# Patient Record
Sex: Male | Born: 1945
Health system: Southern US, Community
[De-identification: ages and names within clinical notes are randomized; demographics above are authoritative.]

## PROBLEM LIST (undated history)

## (undated) DIAGNOSIS — Z803 Family history of malignant neoplasm of breast: Secondary | ICD-10-CM

## (undated) DIAGNOSIS — E559 Vitamin D deficiency, unspecified: Secondary | ICD-10-CM

## (undated) DIAGNOSIS — I2510 Atherosclerotic heart disease of native coronary artery without angina pectoris: Secondary | ICD-10-CM

## (undated) DIAGNOSIS — K529 Noninfective gastroenteritis and colitis, unspecified: Secondary | ICD-10-CM

## (undated) DIAGNOSIS — K219 Gastro-esophageal reflux disease without esophagitis: Secondary | ICD-10-CM

## (undated) DIAGNOSIS — N400 Enlarged prostate without lower urinary tract symptoms: Secondary | ICD-10-CM

## (undated) DIAGNOSIS — N529 Male erectile dysfunction, unspecified: Secondary | ICD-10-CM

## (undated) DIAGNOSIS — I10 Essential (primary) hypertension: Secondary | ICD-10-CM

## (undated) DIAGNOSIS — E119 Type 2 diabetes mellitus without complications: Secondary | ICD-10-CM

## (undated) DIAGNOSIS — E785 Hyperlipidemia, unspecified: Secondary | ICD-10-CM

## (undated) DIAGNOSIS — Z806 Family history of leukemia: Secondary | ICD-10-CM

## (undated) DIAGNOSIS — N200 Calculus of kidney: Secondary | ICD-10-CM

## (undated) HISTORY — DX: Vitamin D deficiency, unspecified: E55.9

## (undated) HISTORY — DX: Family history of malignant neoplasm of breast: Z80.3

## (undated) HISTORY — DX: Male erectile dysfunction, unspecified: N52.9

## (undated) HISTORY — DX: Benign prostatic hyperplasia without lower urinary tract symptoms: N40.0

## (undated) HISTORY — DX: Noninfective gastroenteritis and colitis, unspecified: K52.9

## (undated) HISTORY — DX: Essential (primary) hypertension: I10

## (undated) HISTORY — DX: Family history of leukemia: Z80.6

## (undated) HISTORY — DX: Type 2 diabetes mellitus without complications: E11.9

## (undated) HISTORY — DX: Gastro-esophageal reflux disease without esophagitis: K21.9

## (undated) HISTORY — DX: Calculus of kidney: N20.0

## (undated) HISTORY — DX: Hyperlipidemia, unspecified: E78.5

---

## 1989-10-08 HISTORY — PX: CYSTOSCOPY W/ RETROGRADES: SHX1426

## 1991-10-09 HISTORY — PX: FLEXIBLE SIGMOIDOSCOPY: SHX1649

## 1999-06-15 ENCOUNTER — Encounter: Payer: Self-pay | Admitting: Internal Medicine

## 1999-06-15 ENCOUNTER — Ambulatory Visit (HOSPITAL_COMMUNITY): Admission: RE | Admit: 1999-06-15 | Discharge: 1999-06-15 | Payer: Self-pay | Admitting: Internal Medicine

## 1999-10-09 HISTORY — PX: SQUAMOUS CELL CARCINOMA EXCISION: SHX2433

## 2000-08-19 ENCOUNTER — Ambulatory Visit (HOSPITAL_BASED_OUTPATIENT_CLINIC_OR_DEPARTMENT_OTHER): Admission: RE | Admit: 2000-08-19 | Discharge: 2000-08-19 | Payer: Self-pay | Admitting: Plastic Surgery

## 2007-06-09 LAB — HM COLONOSCOPY

## 2013-09-16 ENCOUNTER — Encounter: Payer: Self-pay | Admitting: Internal Medicine

## 2013-09-18 ENCOUNTER — Ambulatory Visit (INDEPENDENT_AMBULATORY_CARE_PROVIDER_SITE_OTHER): Payer: Medicare Other | Admitting: Internal Medicine

## 2013-09-18 ENCOUNTER — Encounter: Payer: Self-pay | Admitting: Internal Medicine

## 2013-09-18 VITALS — BP 136/70 | HR 44 | Temp 97.9°F | Resp 18 | Ht 69.25 in | Wt 190.4 lb

## 2013-09-18 DIAGNOSIS — Z79899 Other long term (current) drug therapy: Secondary | ICD-10-CM

## 2013-09-18 DIAGNOSIS — E119 Type 2 diabetes mellitus without complications: Secondary | ICD-10-CM

## 2013-09-18 DIAGNOSIS — E559 Vitamin D deficiency, unspecified: Secondary | ICD-10-CM

## 2013-09-18 DIAGNOSIS — Z1212 Encounter for screening for malignant neoplasm of rectum: Secondary | ICD-10-CM

## 2013-09-18 DIAGNOSIS — I10 Essential (primary) hypertension: Secondary | ICD-10-CM

## 2013-09-18 DIAGNOSIS — Z0000 Encounter for general adult medical examination without abnormal findings: Secondary | ICD-10-CM

## 2013-09-18 DIAGNOSIS — E782 Mixed hyperlipidemia: Secondary | ICD-10-CM

## 2013-09-18 DIAGNOSIS — Z125 Encounter for screening for malignant neoplasm of prostate: Secondary | ICD-10-CM

## 2013-09-18 LAB — CBC WITH DIFFERENTIAL/PLATELET
Basophils Absolute: 0.1 10*3/uL (ref 0.0–0.1)
Basophils Relative: 1 % (ref 0–1)
Eosinophils Absolute: 0.8 10*3/uL — ABNORMAL HIGH (ref 0.0–0.7)
Hemoglobin: 15.1 g/dL (ref 13.0–17.0)
MCH: 29.4 pg (ref 26.0–34.0)
MCHC: 35.8 g/dL (ref 30.0–36.0)
Monocytes Absolute: 0.9 10*3/uL (ref 0.1–1.0)
Monocytes Relative: 9 % (ref 3–12)
Neutrophils Relative %: 56 % (ref 43–77)
RDW: 13.6 % (ref 11.5–15.5)
WBC: 10 10*3/uL (ref 4.0–10.5)

## 2013-09-18 LAB — HEPATIC FUNCTION PANEL
ALT: 13 U/L (ref 0–53)
Alkaline Phosphatase: 54 U/L (ref 39–117)
Total Bilirubin: 0.7 mg/dL (ref 0.3–1.2)
Total Protein: 7 g/dL (ref 6.0–8.3)

## 2013-09-18 LAB — BASIC METABOLIC PANEL WITH GFR
BUN: 17 mg/dL (ref 6–23)
Calcium: 9.7 mg/dL (ref 8.4–10.5)
Chloride: 101 mEq/L (ref 96–112)
GFR, Est African American: 75 mL/min
GFR, Est Non African American: 65 mL/min
Glucose, Bld: 83 mg/dL (ref 70–99)
Potassium: 4.8 mEq/L (ref 3.5–5.3)
Sodium: 139 mEq/L (ref 135–145)

## 2013-09-18 LAB — HEMOGLOBIN A1C
Hgb A1c MFr Bld: 6 % — ABNORMAL HIGH (ref <5.7)
Mean Plasma Glucose: 126 mg/dL — ABNORMAL HIGH (ref <117)

## 2013-09-18 LAB — LIPID PANEL
Cholesterol: 180 mg/dL (ref 0–200)
Triglycerides: 125 mg/dL (ref <150)

## 2013-09-18 LAB — MAGNESIUM: Magnesium: 1.9 mg/dL (ref 1.5–2.5)

## 2013-09-18 MED ORDER — ATENOLOL 100 MG PO TABS: ORAL_TABLET | ORAL | Status: DC

## 2013-09-18 NOTE — Patient Instructions (Addendum)
Continue diet & medications same as discussed.   Further disposition pending lab results.    Stop Dicyclomine/ Bentyl   Cut Atenolol 100 mg tabs in 1/2 = 50 mg and monitor BP and pulse - call if BP goes up over 145/90 or pulse stays below 55 beats per minute     Hypertension As your heart beats, it forces blood through your arteries. This force is your blood pressure. If the pressure is too high, it is called hypertension (HTN) or high blood pressure. HTN is dangerous because you may have it and not know it. High blood pressure may mean that your heart has to work harder to pump blood. Your arteries may be narrow or stiff. The extra work puts you at risk for heart disease, stroke, and other problems.  Blood pressure consists of two numbers, a higher number over a lower, 110/72, for example. It is stated as "110 over 72." The ideal is below 120 for the top number (systolic) and under 80 for the bottom (diastolic). Write down your blood pressure today. You should pay close attention to your blood pressure if you have certain conditions such as:  Heart failure.  Prior heart attack.  Diabetes  Chronic kidney disease.  Prior stroke.  Multiple risk factors for heart disease. To see if you have HTN, your blood pressure should be measured while you are seated with your arm held at the level of the heart. It should be measured at least twice. A one-time elevated blood pressure reading (especially in the Emergency Department) does not mean that you need treatment. There may be conditions in which the blood pressure is different between your right and left arms. It is important to see your caregiver soon for a recheck. Most people have essential hypertension which means that there is not a specific cause. This type of high blood pressure may be lowered by changing lifestyle factors such as:  Stress.  Smoking.  Lack of exercise.  Excessive weight.  Drug/tobacco/alcohol use.  Eating less  salt. Most people do not have symptoms from high blood pressure until it has caused damage to the body. Effective treatment can often prevent, delay or reduce that damage. TREATMENT  When a cause has been identified, treatment for high blood pressure is directed at the cause. There are a large number of medications to treat HTN. These fall into several categories, and your caregiver will help you select the medicines that are best for you. Medications may have side effects. You should review side effects with your caregiver. If your blood pressure stays high after you have made lifestyle changes or started on medicines,   Your medication(s) may need to be changed.  Other problems may need to be addressed.  Be certain you understand your prescriptions, and know how and when to take your medicine.  Be sure to follow up with your caregiver within the time frame advised (usually within two weeks) to have your blood pressure rechecked and to review your medications.  If you are taking more than one medicine to lower your blood pressure, make sure you know how and at what times they should be taken. Taking two medicines at the same time can result in blood pressure that is too low. SEEK IMMEDIATE MEDICAL CARE IF:  You develop a severe headache, blurred or changing vision, or confusion.  You have unusual weakness or numbness, or a faint feeling.  You have severe chest or abdominal pain, vomiting, or breathing problems. MAKE SURE  YOU:   Understand these instructions.  Will watch your condition.  Will get help right away if you are not doing well or get worse. Document Released: 09/24/2005 Document Revised: 12/17/2011 Document Reviewed: 05/14/2008 [facility] Patient Information 2014 [place], [place].  Diabetes and Exercise Exercising regularly is important. It is not just about losing weight. It has many health benefits, such as:  Improving your overall fitness, flexibility, and  endurance.  Increasing your bone density.  Helping with weight control.  Decreasing your body fat.  Increasing your muscle strength.  Reducing stress and tension.  Improving your overall health. People with diabetes who exercise gain additional benefits because exercise:  Reduces appetite.  Improves the body's use of blood sugar (glucose).  Helps lower or control blood glucose.  Decreases blood pressure.  Helps control blood lipids (such as cholesterol and triglycerides).  Improves the body's use of the hormone insulin by:  Increasing the body's insulin sensitivity.  Reducing the body's insulin needs.  Decreases the risk for heart disease because exercising:  Lowers cholesterol and triglycerides levels.  Increases the levels of good cholesterol (such as high-density lipoproteins [HDL]) in the body.  Lowers blood glucose levels. YOUR ACTIVITY PLAN  Choose an activity that you enjoy and set realistic goals. Your health care provider or diabetes educator can help you make an activity plan that works for you. You can break activities into 2 or 3 sessions throughout the day. Doing so is as good as one long session. Exercise ideas include:  Taking the dog for a walk.  Taking the stairs instead of the elevator.  Dancing to your favorite song.  Doing your favorite exercise with a friend. RECOMMENDATIONS FOR EXERCISING WITH TYPE 1 OR TYPE 2 DIABETES   Check your blood glucose before exercising. If blood glucose levels are greater than 240 mg/dL, check for urine ketones. Do not exercise if ketones are present.  Avoid injecting insulin into areas of the body that are going to be exercised. For example, avoid injecting insulin into:  The arms when playing tennis.  The legs when jogging.  Keep a record of:  Food intake before and after you exercise.  Expected peak times of insulin action.  Blood glucose levels before and after you exercise.  The type and amount of  exercise you have done.  Review your records with your health care provider. Your health care provider will help you to develop guidelines for adjusting food intake and insulin amounts before and after exercising.  If you take insulin or oral hypoglycemic agents, watch for signs and symptoms of hypoglycemia. They include:  Dizziness.  Shaking.  Sweating.  Chills.  Confusion.  Drink plenty of water while you exercise to prevent dehydration or heat stroke. Body water is lost during exercise and must be replaced.  Talk to your health care provider before starting an exercise program to make sure it is safe for you. Remember, almost any type of activity is better than none. Document Released: 12/15/2003 Document Revised: 05/27/2013 Document Reviewed: 03/03/2013 [facility] Patient Information 2014 [place], [place].  Cholesterol Cholesterol is a white, waxy, fat-like protein needed by your body in small amounts. The liver makes all the cholesterol you need. It is carried from the liver by the blood through the blood vessels. Deposits (plaque) may build up on blood vessel walls. This makes the arteries narrower and stiffer. Plaque increases the risk for heart attack and stroke. You cannot feel your cholesterol level even if it is very high. The only  way to know is by a blood test to check your lipid (fats) levels. Once you know your cholesterol levels, you should keep a record of the test results. Work with your caregiver to to keep your levels in the desired range. WHAT THE RESULTS MEAN:  Total cholesterol is a rough measure of all the cholesterol in your blood.  LDL is the so-called bad cholesterol. This is the type that deposits cholesterol in the walls of the arteries. You want this level to be low.  HDL is the good cholesterol because it cleans the arteries and carries the LDL away. You want this level to be high.  Triglycerides are fat that the body can either burn for energy or store.  High levels are closely linked to heart disease. DESIRED LEVELS:  Total cholesterol below 200.  LDL below 100 for people at risk, below 70 for very high risk.  HDL above 50 is good, above 60 is best.  Triglycerides below 150. HOW TO LOWER YOUR CHOLESTEROL:  Diet.  Choose fish or white meat chicken and [place], roasted or baked. Limit fatty cuts of red meat, fried foods, and processed meats, such as sausage and lunch meat.  Eat lots of fresh fruits and vegetables. Choose whole grains, beans, pasta, potatoes and cereals.  Use only small amounts of olive, corn or canola oils. Avoid butter, mayonnaise, shortening or palm kernel oils. Avoid foods with trans-fats.  Use skim/nonfat milk and low-fat/nonfat yogurt and cheeses. Avoid whole milk, cream, ice cream, egg yolks and cheeses. Healthy desserts include angel food cake, ginger snaps, animal crackers, hard candy, popsicles, and low-fat/nonfat frozen yogurt. Avoid pastries, cakes, pies and cookies.  Exercise.  A regular program helps decrease LDL and raises HDL.  Helps with weight control.  Do things that increase your activity level like gardening, walking, or taking the stairs.  Medication.  May be prescribed by your caregiver to help lowering cholesterol and the risk for heart disease.  You may need medicine even if your levels are normal if you have several risk factors. HOME CARE INSTRUCTIONS   Follow your diet and exercise programs as suggested by your caregiver.  Take medications as directed.  Have blood work done when your caregiver feels it is necessary. MAKE SURE YOU:   Understand these instructions.  Will watch your condition.  Will get help right away if you are not doing well or get worse. Document Released: 06/19/2001 Document Revised: 12/17/2011 Document Reviewed: 12/10/2007 [facility] Patient Information 2014 [place], [place].  Vitamin D Deficiency Vitamin D is an important vitamin that your body needs.  Having too little of it in your body is called a deficiency. A very bad deficiency can make your bones soft and can cause a condition called rickets.  Vitamin D is important to your body for different reasons, such as:   It helps your body absorb 2 minerals called calcium and phosphorus.  It helps make your bones healthy.  It may prevent some diseases, such as diabetes and multiple sclerosis.  It helps your muscles and heart. You can get vitamin D in several ways. It is a natural part of some foods. The vitamin is also added to some dairy products and cereals. Some people take vitamin D supplements. Also, your body makes vitamin D when you are in the sun. It changes the sun's rays into a form of the vitamin that your body can use. CAUSES   Not eating enough foods that contain vitamin D.  Not getting enough  sunlight.  Having certain digestive system diseases that make it hard to absorb vitamin D. These diseases include Crohn's disease, chronic pancreatitis, and cystic fibrosis.  Having a surgery in which part of the stomach or small intestine is removed.  Being obese. Fat cells pull vitamin D out of your blood. That means that obese people may not have enough vitamin D left in their blood and in other body tissues.  Having chronic kidney or liver disease. RISK FACTORS Risk factors are things that make you more likely to develop a vitamin D deficiency. They include:  Being older.  Not being able to get outside very much.  Living in a nursing home.  Having had broken bones.  Having weak or thin bones (osteoporosis).  Having a disease or condition that changes how your body absorbs vitamin D.  Having dark skin.  Some medicines such as seizure medicines or steroids.  Being overweight or obese. SYMPTOMS Mild cases of vitamin D deficiency may not have any symptoms. If you have a very bad case, symptoms may include:  Bone pain.  Muscle pain.  Falling often.  Broken bones  caused by a minor injury, due to osteoporosis. DIAGNOSIS A blood test is the best way to tell if you have a vitamin D deficiency. TREATMENT Vitamin D deficiency can be treated in different ways. Treatment for vitamin D deficiency depends on what is causing it. Options include:  Taking vitamin D supplements.  Taking a calcium supplement. Your caregiver will suggest what dose is best for you. HOME CARE INSTRUCTIONS  Take any supplements that your caregiver prescribes. Follow the directions carefully. Take only the suggested amount.  Have your blood tested 2 months after you start taking supplements.  Eat foods that contain vitamin D. Healthy choices include:  Fortified dairy products, cereals, or juices. Fortified means vitamin D has been added to the food. Check the label on the package to be sure.  Fatty fish like salmon or trout.  Eggs.  Oysters.  Do not use a tanning bed.  Keep your weight at a healthy level. Lose weight if you need to.  Keep all follow-up appointments. Your caregiver will need to perform blood tests to make sure your vitamin D deficiency is going away. SEEK MEDICAL CARE IF:  You have any questions about your treatment.  You continue to have symptoms of vitamin D deficiency.  You have nausea or vomiting.  You are constipated.  You feel confused.  You have severe abdominal or back pain. MAKE SURE YOU:  Understand these instructions.  Will watch your condition.  Will get help right away if you are not doing well or get worse. Document Released: 12/17/2011 Document Revised: 01/19/2013 Document Reviewed: 12/17/2011 [facility] Patient Information 2014 [place], [place].

## 2013-09-18 NOTE — Progress Notes (Signed)
Patient ID: Daniel Burnett, male   DOB: 06-27-1946, 67 y.o.   MRN: [id number]  Annual Screening Comprehensive Examination  This very nice 67 yo MWMpresents for complete physical.  Patient has been followed for HTN (1995), Diabetes (2002), Hyperlipidemia, and Vitamin D Deficiency.   Patient's BP has been controlled at home. Patient denies any cardiac symptoms as chest pain, palpitations, shortness of breath, dizziness or ankle swelling.   Patient's hyperlipidemia is controlled with diet and medications. Patient denies myalgias or other medication SE's. Last cholesterol last visit was  184, triglycerides 124, HDL 45 and LDL 115 in June.     Patient continues to c/o intermittent abdominal bloating - possibly more post-prandial related and may even be related to his Metformin treatments. Trials with Dicyclomine have not shown benefit.He denies aany heartburn or reflux type symptoms and has had no c/o diarrhea or constipation.   Patient has Diabetes with A1c 6.1% dropping to 5.9% in June.  He reports FBG's range 70-90 mg%. Patient denies reactive hypoglycemic symptoms, visual blurring, diabetic polys, or paresthesias.   Finally, patient has history of Vitamin D Deficiency with last vitamin D 88 in June.     Medication Sig Dispense Refill  . aspirin 81 MG tablet Take 81 mg by mouth daily.      Marland Kitchen atenolol (TENORMIN) 100 MG tablet Take 100 mg by mouth daily.      . benazepril (LOTENSIN) 40 MG tablet Take 40 mg by mouth daily.      . cholecalciferol (VITAMIN D) 1000 UNITS tablet Take 8,000 Units by mouth daily.      Marland Kitchen glipiZIDE (GLUCOTROL) 5 MG tablet Take 2.5 mg by mouth 2 (two) times daily before a meal.      . metformin (FORTAMET) 500 MG (OSM) 24 hr tablet Take 500 mg by mouth 2 (two) times daily with a meal.      . Omega-3 Fatty Acids (FISH OIL PO) Take 2 tablets by mouth daily.         Allergies  Allergen Reactions  . Aspirin   . Ppd [Tuberculin Purified Protein Derivative] Other (See  Comments)    Positive tb test.  . Tricor [Fenofibrate] Other (See Comments)    GI upset    Past Medical History  Diagnosis Date  . Hyperlipidemia   . Hypertension   . Diabetes mellitus without complication   . Vitamin D deficiency   . GERD (gastroesophageal reflux disease)   . ED (erectile dysfunction)   . Colitis   . BPH (benign prostatic hyperplasia)   . Kidney stone     Past Surgical History  Procedure Laterality Date  . Cystoscopy w/ retrogrades  1991    stone extractions  . Flexible sigmoidoscopy  1993    negative  . Squamous cell carcinoma excision  2001    bridge of nose    Family History  Problem Relation Age of Onset  . Heart disease Mother   . Heart attack Mother   . Heart disease Father   . Heart attack Father   . Diabetes Maternal Aunt   . Heart disease Brother   . Stroke Brother   . Hypertension Brother   . Cancer Cousin   . Leukemia Cousin     History  Substance Use Topics  . Smoking status: Never Smoker   . Smokeless tobacco: Not on file  . Alcohol Use: No    ROS Constitutional: Denies fever, chills, weight loss/gain, headaches, insomnia, fatigue, night sweats, and change in  appetite. Eyes: Denies redness, blurred vision, diplopia, discharge, itchy, watery eyes.  ENT: Denies discharge, congestion, post nasal drip, epistaxis, sore throat, earache, hearing loss, dental pain, Tinnitus, Vertigo, Sinus pain, snoring.  Cardio: Denies chest pain, palpitations, irregular heartbeat, syncope, dyspnea, diaphoresis, orthopnea, PND, claudication, edema Respiratory: denies cough, dyspnea, DOE, pleurisy, hoarseness, laryngitis, wheezing.  Gastrointestinal: Denies dysphagia, heartburn, reflux, water brash, pain, cramps, nausea, vomiting, bloating, diarrhea, constipation, hematemesis, melena, hematochezia, jaundice, hemorrhoids Genitourinary: Denies dysuria, frequency, urgency, nocturia, hesitancy, discharge, hematuria, flank pain Breast:Breast lumps, nipple  discharge, bleeding.  Musculoskeletal: Denies arthralgia, myalgia, stiffness, Jt. Swelling, pain, limp, and strain/sprain. Skin: Denies puritis, rash, hives, warts, acne, eczema, changing in skin lesion Neuro: No weakness, tremor, incoordination, spasms, paresthesia, pain Psychiatric: Denies confusion, memory loss, sensory loss Endocrine: Denies change in weight, skin, hair change, nocturia, and paresthesia, diabetic polys, visual blurring, hyper / hypo glycemic episodes.  Heme/Lymph: No excessive bleeding, bruising, enlarged lymph nodes.  Filed Vitals:   09/18/13 0927  BP: 136/70  Pulse: 44  Temp: 97.9 F (36.6 C)  Resp: 18    Estimated body mass index is 27.91 kg/(m^2) as calculated from the following:   Height as of this encounter: 5' 9.25" (1.759 m).   Weight as of this encounter: 190 lb 6.4 oz (86.365 kg).  Physical Exam  General Appearance: Well nourished, in no apparent distress. Eyes: PERRLA, EOMs, conjunctiva no swelling or erythema, normal fundi and vessels. Sinuses: No frontal/maxillary tenderness ENT/Mouth: EACs patent / TMs  nl. Nares clear without erythema, swelling, mucoid exudates. Oral hygiene is good. No erythema, swelling, or exudate. Tongue normal, non-obstructing. Tonsils not swollen or erythematous. Hearing normal.  Neck: Supple, thyroid normal. No bruits, nodes or JVD. Respiratory: Respiratory effort normal.  BS equal and clear bilateral without rales, rhonci, wheezing or stridor. Cardio: Heart sounds are normal with regular rate and rhythm and no murmurs, rubs or gallops. Peripheral pulses are normal and equal bilaterally without edema. No aortic or femoral bruits. Chest: symmetric with normal excursions and percussion. Breasts: Symmetric, without lumps, nipple discharge, retractions, or fibrocystic changes.  Abdomen: Flat, soft, with bowl sounds. Nontender, no guarding, rebound, hernias, masses, or organomegaly.  Lymphatics: Non tender without  lymphadenopathy.  Genitourinary:  Musculoskeletal: Full ROM all peripheral extremities, joint stability, 5/5 strength, and normal gait. Skin: Warm and dry without rashes, lesions, cyanosis, clubbing or  ecchymosis.  Neuro: Cranial nerves intact, reflexes equal bilaterally. Normal muscle tone, no cerebellar symptoms. Sensation intact.  Pysch: Awake and oriented X 3, normal affect, Insight and Judgment appropriate.   Assessment and Plan  1. Annual Screening Examination 2. Hypertension  3. Hyperlipidemia 4. T2 Diabetes 5. Vitamin D Deficiency  Continue prudent diet as discussed, weight control, BP monitoring, regular exercise, and medications. Discussed med's effects and SE's. Screening labs and tests as requested with regular follow-up as recommended.Currently, patient declines/defers any further GI work-up at least until after the first of the year. He is planning to return fo cryosurg of several scaly patches of the face felt to represent solar keratoses.

## 2013-09-19 LAB — URINALYSIS, MICROSCOPIC ONLY
Bacteria, UA: NONE SEEN
Casts: NONE SEEN
Crystals: NONE SEEN
Squamous Epithelial / HPF: NONE SEEN

## 2013-09-19 LAB — MICROALBUMIN / CREATININE URINE RATIO
Creatinine, Urine: 166.1 mg/dL
Microalb Creat Ratio: 7.8 mg/g (ref 0.0–30.0)
Microalb, Ur: 1.3 mg/dL (ref 0.00–1.89)

## 2013-09-19 LAB — VITAMIN D 25 HYDROXY (VIT D DEFICIENCY, FRACTURES): Vit D, 25-Hydroxy: 73 ng/mL (ref 30–89)

## 2013-09-19 LAB — INSULIN, FASTING: Insulin fasting, serum: 13 u[IU]/mL (ref 3–28)

## 2013-09-19 LAB — PSA: PSA: 1.78 ng/mL (ref <=4.00)

## 2013-09-23 ENCOUNTER — Other Ambulatory Visit: Payer: Self-pay

## 2013-09-23 MED ORDER — ATENOLOL 100 MG PO TABS: 100.0000 mg | ORAL_TABLET | Freq: Every day | ORAL | Status: DC

## 2013-11-05 ENCOUNTER — Other Ambulatory Visit: Payer: Self-pay | Admitting: Internal Medicine

## 2013-11-06 ENCOUNTER — Ambulatory Visit: Payer: Self-pay | Admitting: Internal Medicine

## 2013-11-06 DIAGNOSIS — Z79899 Other long term (current) drug therapy: Secondary | ICD-10-CM | POA: Insufficient documentation

## 2013-11-17 ENCOUNTER — Other Ambulatory Visit: Payer: Self-pay | Admitting: Internal Medicine

## 2014-01-04 ENCOUNTER — Ambulatory Visit (INDEPENDENT_AMBULATORY_CARE_PROVIDER_SITE_OTHER): Payer: Medicare Other | Admitting: Internal Medicine

## 2014-01-04 ENCOUNTER — Encounter: Payer: Self-pay | Admitting: Internal Medicine

## 2014-01-04 VITALS — BP 136/74 | HR 44 | Temp 98.1°F | Resp 16 | Ht 68.5 in | Wt 189.6 lb

## 2014-01-04 DIAGNOSIS — I10 Essential (primary) hypertension: Secondary | ICD-10-CM

## 2014-01-04 DIAGNOSIS — E782 Mixed hyperlipidemia: Secondary | ICD-10-CM

## 2014-01-04 DIAGNOSIS — Z79899 Other long term (current) drug therapy: Secondary | ICD-10-CM

## 2014-01-04 DIAGNOSIS — L570 Actinic keratosis: Secondary | ICD-10-CM

## 2014-01-04 DIAGNOSIS — E1129 Type 2 diabetes mellitus with other diabetic kidney complication: Secondary | ICD-10-CM

## 2014-01-04 DIAGNOSIS — E559 Vitamin D deficiency, unspecified: Secondary | ICD-10-CM

## 2014-01-04 LAB — CBC WITH DIFFERENTIAL/PLATELET
BASOS PCT: 1 % (ref 0–1)
Basophils Absolute: 0.1 10*3/uL (ref 0.0–0.1)
EOS ABS: 0.9 10*3/uL — AB (ref 0.0–0.7)
Eosinophils Relative: 7 % — ABNORMAL HIGH (ref 0–5)
HEMATOCRIT: 43 % (ref 39.0–52.0)
HEMOGLOBIN: 14.8 g/dL (ref 13.0–17.0)
LYMPHS ABS: 3.4 10*3/uL (ref 0.7–4.0)
Lymphocytes Relative: 27 % (ref 12–46)
MCH: 28.8 pg (ref 26.0–34.0)
MCHC: 34.4 g/dL (ref 30.0–36.0)
MCV: 83.7 fL (ref 78.0–100.0)
MONO ABS: 0.9 10*3/uL (ref 0.1–1.0)
Monocytes Relative: 7 % (ref 3–12)
Neutro Abs: 7.4 10*3/uL (ref 1.7–7.7)
Neutrophils Relative %: 58 % (ref 43–77)
Platelets: 310 10*3/uL (ref 150–400)
RBC: 5.14 MIL/uL (ref 4.22–5.81)
RDW: 14.3 % (ref 11.5–15.5)
WBC: 12.7 10*3/uL — ABNORMAL HIGH (ref 4.0–10.5)

## 2014-01-04 LAB — HEMOGLOBIN A1C
Hgb A1c MFr Bld: 5.9 % — ABNORMAL HIGH (ref <5.7)
Mean Plasma Glucose: 123 mg/dL — ABNORMAL HIGH (ref <117)

## 2014-01-04 NOTE — Progress Notes (Signed)
Patient ID: Daniel Burnett, male   DOB: Mar 15, 1946, 68 y.o.   MRN: [id number]    This very nice 68 y.o. MWM presents for 3 month follow up with Hypertension, Hyperlipidemia, T2 NIDDM w/Stage II CKD and Vitamin D Deficiency.    HTN predates since 1995.  BP has been controlled at home. Today's BP: 136/74 mmHg . Patient denies any cardiac type chest pain, palpitations, dyspnea/orthopnea/PND, dizziness, claudication, or dependent edema.   Hyperlipidemia is controlled with diet & meds. Last Cholesterol was 180, Triglycerides were 125, HDL 50 and LDL 105 in Dec 2014 - near goal. Patient denies myalgias or other med SE's.    Also, the patient has history of T2 NIDDM since 2002 w/ Stage II CKD (GFR 65 ml/min) and  last A1c of 6.0%. Home glucoses range 65-130 mg% .Patient denies any symptoms of reactive hypoglycemia, diabetic polys, paresthesias or visual blurring.   Further, Patient has history of Vitamin D Deficiency of 19 in 2008 with last vitamin D of 73 in Dec 2014. Patient supplements vitamin D without any suspected side-effects.  Medication Sig  . aspirin 81 MG tablet Take 81 mg by mouth daily.  Marland Kitchen atenolol (TENORMIN) 100 MG tablet Take 1 tablet (100 mg total) by mouth daily.  . benazepril (LOTENSIN) 40 MG tablet TAKE 1 TABLET BY MOUTH EVERY DAY FOR BLOOD PRESSURE  . cholecalciferol (VITAMIN D) 1000 UNITS tablet Take 8,000 Units by mouth daily.  Marland Kitchen glipiZIDE (GLUCOTROL) 5 MG tablet Take 2.5 mg by mouth 2 (two) times daily before a meal.  . magnesium gluconate (MAGONATE) 500 MG tablet Take 500 mg by mouth daily.  . metFORMIN (GLUCOPHAGE-XR) 500 MG 24 hr tablet TAKE 1-2 TABLETS TWICE A DAY FOR DIABETES  . Omega-3 Fatty Acids (FISH OIL PO) Take 12,002 mg by mouth 2 (two) times daily.   . Red Yeast Rice 600 MG CAPS Take 600 mg by mouth. Takes 4 per day   Allergies  Allergen Reactions  . Aspirin   . Ppd [Tuberculin Purified Protein Derivative] Other (See Comments)    Positive tb test.  . Tricor  [Fenofibrate] Other (See Comments)    GI upset   PMHx:   Past Medical History  Diagnosis Date  . Hyperlipidemia   . Hypertension   . Diabetes mellitus without complication   . Vitamin D deficiency   . GERD (gastroesophageal reflux disease)   . ED (erectile dysfunction)   . Colitis   . BPH (benign prostatic hyperplasia)   . Kidney stone    FHx:    Reviewed / unchanged  SHx:    Reviewed / unchanged   Systems Review: Constitutional: Denies fever, chills, wt changes, headaches, insomnia, fatigue, night sweats, change in appetite. Eyes: Denies redness, blurred vision, diplopia, discharge, itchy, watery eyes.  ENT: Denies discharge, congestion, post nasal drip, epistaxis, sore throat, earache, hearing loss, dental pain, tinnitus, vertigo, sinus pain, snoring.  CV: Denies chest pain, palpitations, irregular heartbeat, syncope, dyspnea, diaphoresis, orthopnea, PND, claudication, edema. Respiratory: denies cough, dyspnea, DOE, pleurisy, hoarseness, laryngitis, wheezing.  Gastrointestinal: Denies dysphagia, odynophagia, heartburn, reflux, water brash, abdominal pain or cramps, nausea, vomiting, bloating, diarrhea, constipation, hematemesis, melena, hematochezia,  or hemorrhoids. Genitourinary: Denies dysuria, frequency, urgency, nocturia, hesitancy, discharge, hematuria, flank pain. Musculoskeletal: Denies arthralgias, myalgias, stiffness, jt. swelling, pain, limp, strain/sprain.  Skin: Denies pruritus, rash, hives, warts, acne, eczema, change in skin lesion(s). Neuro: No weakness, tremor, incoordination, spasms, paresthesia, or pain. Psychiatric: Denies confusion, memory loss, or sensory loss. Endo: Denies change  in weight, skin, hair change.  Heme/Lymph: No excessive bleeding, bruising, orenlarged lymph nodes.  Exam:  BP 136/74  Pulse 44  Temp(Src) 98.1 F (36.7 C) (Temporal)  Resp 16  Ht 5' 8.5" (1.74 m)  Wt 189 lb 9.6 oz (86.002 kg)  BMI 28.41 kg/m2  Appears well nourished -  in no distress. Eyes: PERRLA, EOMs, conjunctiva no swelling or erythema. Sinuses: No frontal/maxillary tenderness ENT/Mouth: EAC's clear, TM's nl w/o erythema, bulging. Nares clear w/o erythema, swelling, exudates. Oropharynx clear without erythema or exudates. Oral hygiene is good. Tongue normal, non obstructing. Hearing intact.  Neck: Supple. Thyroid nl. Car 2+/2+ without bruits, nodes or JVD. Chest: Respirations nl with BS clear & equal w/o rales, rhonchi, wheezing or stridor.  Cor: Heart sounds normal w/ regular rate and rhythm without sig. murmurs, gallops, clicks, or rubs. Peripheral pulses normal and equal  without edema.  Abdomen: Soft & bowel sounds normal. Non-tender w/o guarding, rebound, hernias, masses, or organomegaly.  Lymphatics: Unremarkable.  Musculoskeletal: Full ROM all peripheral extremities, joint stability, 5/5 strength, and normal gait.  Skin: Warm, dry without exposed rashes, lesions, ecchymosis apparent.  Neuro: Cranial nerves intact, reflexes equal bilaterally. Sensory-motor testing grossly intact. Tendon reflexes grossly intact.  Pysch: Alert & oriented x 3. Insight and judgement nl & appropriate. No ideations.  Assessment and Plan:  1. Hypertension - Continue monitor blood pressure at home. Continue diet/meds same.  2. Hyperlipidemia - Continue diet/meds, exercise,& lifestyle modifications. Continue monitor periodic cholesterol/liver & renal functions   3. T2 NIDDM w/Stage II CKD - continue recommend prudent low glycemic diet, weight control, regular exercise, diabetic monitoring and periodic eye exams.  4. Vitamin D Deficiency - Continue supplementation.  Recommended regular exercise, BP monitoring, weight control, and discussed med and SE's. Recommended labs to assess and monitor clinical status. Further disposition pending results of labs.

## 2014-01-04 NOTE — Patient Instructions (Signed)

## 2014-01-05 LAB — HEPATIC FUNCTION PANEL
ALK PHOS: 61 U/L (ref 39–117)
ALT: 13 U/L (ref 0–53)
AST: 14 U/L (ref 0–37)
Albumin: 4.3 g/dL (ref 3.5–5.2)
BILIRUBIN DIRECT: 0.1 mg/dL (ref 0.0–0.3)
BILIRUBIN TOTAL: 0.6 mg/dL (ref 0.2–1.2)
Indirect Bilirubin: 0.5 mg/dL (ref 0.2–1.2)
Total Protein: 7.2 g/dL (ref 6.0–8.3)

## 2014-01-05 LAB — LIPID PANEL
CHOL/HDL RATIO: 2.6 ratio
Cholesterol: 164 mg/dL (ref 0–200)
HDL: 62 mg/dL (ref >39)
LDL CALC: 75 mg/dL (ref 0–99)
Triglycerides: 133 mg/dL (ref <150)
VLDL: 27 mg/dL (ref 0–40)

## 2014-01-05 LAB — BASIC METABOLIC PANEL WITH GFR
BUN: 18 mg/dL (ref 6–23)
CALCIUM: 10 mg/dL (ref 8.4–10.5)
CO2: 26 mEq/L (ref 19–32)
Chloride: 99 mEq/L (ref 96–112)
Creat: 1.13 mg/dL (ref 0.50–1.35)
GFR, EST AFRICAN AMERICAN: 76 mL/min
GFR, Est Non African American: 66 mL/min
Glucose, Bld: 66 mg/dL — ABNORMAL LOW (ref 70–99)
POTASSIUM: 5.2 meq/L (ref 3.5–5.3)
SODIUM: 137 meq/L (ref 135–145)

## 2014-01-05 LAB — TSH: TSH: 1.949 u[IU]/mL (ref 0.350–4.500)

## 2014-01-05 LAB — VITAMIN D 25 HYDROXY (VIT D DEFICIENCY, FRACTURES): Vit D, 25-Hydroxy: 75 ng/mL (ref 30–89)

## 2014-01-05 LAB — INSULIN, FASTING: Insulin fasting, serum: 11 u[IU]/mL (ref 3–28)

## 2014-01-05 LAB — MAGNESIUM: Magnesium: 2 mg/dL (ref 1.5–2.5)

## 2014-01-05 NOTE — Progress Notes (Signed)
Subjective:     Patient ID: Daniel Burnett, male   DOB: 1946-06-07, 68 y.o.   MRN: [id number]  HPI patient c/o about several scaly areas on his nose and bilateral ears that intermittently ulcerate and never completely heal.   Review of Systems     Objective:   Physical Exam there are 3 discrete areas of pink scaly to raw lesions measuring approx 5 x5 mm each on both sides of the nose proximally and also centrally between the 2 lesions. There are also similar lesions on the helix of each ear.     Assessment:     Actinic keratoses, face - multiple     Plan:     After informed consent all # 5 lesions were treated with liq. Nitrogen by triple freeze-thaw technique. Patient was instructed in post treatment expectations for healing and wound care \  Procedure Code(s) 17000 17003 [id number]

## 2014-01-10 ENCOUNTER — Other Ambulatory Visit: Payer: Self-pay | Admitting: Internal Medicine

## 2014-01-11 ENCOUNTER — Ambulatory Visit: Payer: Self-pay | Admitting: Emergency Medicine

## 2014-01-14 ENCOUNTER — Ambulatory Visit: Payer: Self-pay | Admitting: Emergency Medicine

## 2014-02-09 ENCOUNTER — Ambulatory Visit (INDEPENDENT_AMBULATORY_CARE_PROVIDER_SITE_OTHER): Payer: Medicare Other | Admitting: *Deleted

## 2014-02-09 DIAGNOSIS — Z23 Encounter for immunization: Secondary | ICD-10-CM

## 2014-02-09 NOTE — Progress Notes (Signed)
Patient ID: Daniel Burnett, male   DOB: October 05, 1946, 68 y.o.   MRN: [id number] Patient presents for Tetanus shot.  Recently cut left hand with utility knife while remodeling bathroom.  Patient went to Dr. Welton Flakes in [place] for stitches and was told at their office that they do not stock Tetanus shots and to follow up with PCP for shot.

## 2014-04-16 ENCOUNTER — Encounter: Payer: Self-pay | Admitting: Emergency Medicine

## 2014-04-16 ENCOUNTER — Ambulatory Visit (INDEPENDENT_AMBULATORY_CARE_PROVIDER_SITE_OTHER): Payer: Medicare Other | Admitting: Emergency Medicine

## 2014-04-16 VITALS — BP 120/60 | HR 76 | Temp 98.0°F | Resp 16 | Ht 68.5 in | Wt 190.0 lb

## 2014-04-16 DIAGNOSIS — E119 Type 2 diabetes mellitus without complications: Secondary | ICD-10-CM

## 2014-04-16 DIAGNOSIS — I10 Essential (primary) hypertension: Secondary | ICD-10-CM

## 2014-04-16 DIAGNOSIS — IMO0001 Reserved for inherently not codable concepts without codable children: Secondary | ICD-10-CM

## 2014-04-16 DIAGNOSIS — R141 Gas pain: Secondary | ICD-10-CM

## 2014-04-16 DIAGNOSIS — E782 Mixed hyperlipidemia: Secondary | ICD-10-CM

## 2014-04-16 DIAGNOSIS — R143 Flatulence: Secondary | ICD-10-CM

## 2014-04-16 DIAGNOSIS — R142 Eructation: Secondary | ICD-10-CM

## 2014-04-16 LAB — BASIC METABOLIC PANEL WITH GFR
BUN: 25 mg/dL — ABNORMAL HIGH (ref 6–23)
CALCIUM: 9.7 mg/dL (ref 8.4–10.5)
CO2: 28 mEq/L (ref 19–32)
Chloride: 103 mEq/L (ref 96–112)
Creat: 1.26 mg/dL (ref 0.50–1.35)
GFR, EST AFRICAN AMERICAN: 67 mL/min
GFR, EST NON AFRICAN AMERICAN: 58 mL/min — AB
Glucose, Bld: 91 mg/dL (ref 70–99)
POTASSIUM: 5.3 meq/L (ref 3.5–5.3)
Sodium: 138 mEq/L (ref 135–145)

## 2014-04-16 LAB — LIPID PANEL
Cholesterol: 187 mg/dL (ref 0–200)
HDL: 51 mg/dL (ref >39)
LDL CALC: 113 mg/dL — AB (ref 0–99)
Total CHOL/HDL Ratio: 3.7 Ratio
Triglycerides: 116 mg/dL (ref <150)
VLDL: 23 mg/dL (ref 0–40)

## 2014-04-16 LAB — CBC WITH DIFFERENTIAL/PLATELET
BASOS ABS: 0.1 10*3/uL (ref 0.0–0.1)
BASOS PCT: 1 % (ref 0–1)
Eosinophils Absolute: 0.9 10*3/uL — ABNORMAL HIGH (ref 0.0–0.7)
Eosinophils Relative: 9 % — ABNORMAL HIGH (ref 0–5)
HEMATOCRIT: 41.9 % (ref 39.0–52.0)
Hemoglobin: 14.5 g/dL (ref 13.0–17.0)
Lymphocytes Relative: 24 % (ref 12–46)
Lymphs Abs: 2.4 10*3/uL (ref 0.7–4.0)
MCH: 28.6 pg (ref 26.0–34.0)
MCHC: 34.6 g/dL (ref 30.0–36.0)
MCV: 82.6 fL (ref 78.0–100.0)
Monocytes Absolute: 0.8 10*3/uL (ref 0.1–1.0)
Monocytes Relative: 8 % (ref 3–12)
NEUTROS ABS: 5.9 10*3/uL (ref 1.7–7.7)
Neutrophils Relative %: 58 % (ref 43–77)
PLATELETS: 254 10*3/uL (ref 150–400)
RBC: 5.07 MIL/uL (ref 4.22–5.81)
RDW: 14.6 % (ref 11.5–15.5)
WBC: 10.1 10*3/uL (ref 4.0–10.5)

## 2014-04-16 LAB — HEPATIC FUNCTION PANEL
ALK PHOS: 55 U/L (ref 39–117)
ALT: 16 U/L (ref 0–53)
AST: 14 U/L (ref 0–37)
Albumin: 4.4 g/dL (ref 3.5–5.2)
BILIRUBIN INDIRECT: 0.5 mg/dL (ref 0.2–1.2)
Bilirubin, Direct: 0.1 mg/dL (ref 0.0–0.3)
Total Bilirubin: 0.6 mg/dL (ref 0.2–1.2)
Total Protein: 7 g/dL (ref 6.0–8.3)

## 2014-04-16 LAB — HEMOGLOBIN A1C
HEMOGLOBIN A1C: 6.1 % — AB (ref <5.7)
MEAN PLASMA GLUCOSE: 128 mg/dL — AB (ref <117)

## 2014-04-16 NOTE — Progress Notes (Signed)
Subjective:    Patient ID: Daniel Burnett, male    DOB: 01/19/1946, 68 y.o.   MRN: [id number]  HPI Comments: 68 yo WM presents for 3 month F/U for HTN, Cholesterol, DM, D. Deficient. He notes BP has been 110/50 pulse 40-50s. He did decrease Benazepril to 1/2 and Atenolol to 1/3. He is not drinking much water. He notes occasional dizziness if gets up to quick. BS has been good. He eats healthy for the most part. He is working in the yard for exercising. He checks feet routinely and denies skin break down or neuropathy increase.    He notes his stomach is upset more at night/ more gas. He notes pepto seems to help with symptoms. He does not take daily. He denies any specific trigger. He notes BM are regular.  WBC            12.7   01/04/2014 HGB            14.8   01/04/2014 HCT            43.0   01/04/2014 PLT             310   01/04/2014 GLUCOSE          66   01/04/2014 CHOL            164   01/04/2014 TRIG            133   01/04/2014 HDL              62   01/04/2014 LDLCALC          75   01/04/2014 ALT              13   01/04/2014 AST              14   01/04/2014 NA              137   01/04/2014 K               5.2   01/04/2014 CL               99   01/04/2014 CREATININE     1.13   01/04/2014 BUN              18   01/04/2014 CO2              26   01/04/2014 TSH           1.949   01/04/2014 PSA            1.78   09/18/2013 HGBA1C          5.9   01/04/2014 MICROALBUR     1.30   09/18/2013   Hypertension  Diabetes     Medication List       This list is accurate as of: 04/16/14  9:03 AM.  Always use your most recent med list.               aspirin 81 MG tablet  Take 81 mg by mouth daily.     atenolol 100 MG tablet  Commonly known as:  TENORMIN  Take 1 tablet (100 mg total) by mouth daily.     benazepril 40 MG tablet  Commonly known as:  LOTENSIN  TAKE 1 TABLET BY MOUTH EVERY DAY FOR BLOOD PRESSURE     cholecalciferol 1000 UNITS tablet  Commonly known as:  VITAMIN D  Take 8,000  Units by mouth daily.     FISH OIL PO  Take 12,002 mg by mouth 2 (two) times daily.     glipiZIDE 5 MG tablet  Commonly known as:  GLUCOTROL  TAKE 1/2 TO 1 TABLET BY MOUTH TWICE DAILY WITH MEALS     magnesium gluconate 500 MG tablet  Commonly known as:  MAGONATE  Take 500 mg by mouth daily.     metFORMIN 500 MG 24 hr tablet  Commonly known as:  GLUCOPHAGE-XR  TAKE 1-2 TABLETS TWICE A DAY FOR DIABETES     Red Yeast Rice 600 MG Caps  Take 600 mg by mouth. Takes 4 per day       Allergies  Allergen Reactions  . Aspirin   . Ppd [Tuberculin Purified Protein Derivative] Other (See Comments)    Positive tb test.  . Tricor [Fenofibrate] Other (See Comments)    GI upset   Past Medical History  Diagnosis Date  . Hyperlipidemia   . Hypertension   . Diabetes mellitus without complication   . Vitamin D deficiency   . GERD (gastroesophageal reflux disease)   . ED (erectile dysfunction)   . Colitis   . BPH (benign prostatic hyperplasia)   . Kidney stone       Review of Systems  Gastrointestinal: Positive for abdominal distention.  All other systems reviewed and are negative.  BP 120/60  Pulse 76  Temp(Src) 98 F (36.7 C) (Temporal)  Resp 16  Ht 5' 8.5" (1.74 m)  Wt 190 lb (86.183 kg)  BMI 28.47 kg/m2     Objective:   Physical Exam  Nursing note and vitals reviewed. Constitutional: He is oriented to person, place, and time. He appears well-developed and well-nourished.  HENT:  Head: Normocephalic and atraumatic.  Right Ear: External ear normal.  Left Ear: External ear normal.  Nose: Nose normal.  Eyes: Conjunctivae and EOM are normal.  Neck: Normal range of motion. Neck supple. No JVD present. No thyromegaly present.  Cardiovascular: Normal rate, regular rhythm, normal heart sounds and intact distal pulses.   Pulmonary/Chest: Effort normal and breath sounds normal.  Abdominal: Soft. Bowel sounds are normal. He exhibits no distension and no mass. There is no  tenderness. There is no rebound and no guarding.  Musculoskeletal: Normal range of motion. He exhibits no edema and no tenderness.  Lymphadenopathy:    He has no cervical adenopathy.  Neurological: He is alert and oriented to person, place, and time. No cranial nerve deficit. Coordination normal.  Skin: Skin is warm and dry.  Psychiatric: He has a normal mood and affect. His behavior is normal. Judgment and thought content normal.          Assessment & Plan:  1.  3 month F/U for HTN, Cholesterol,DM, D. Deficient. Needs healthy diet, cardio QD and obtain healthy weight. Check Labs, Check BP if >130/80 call office, Check BS if >200 call office   2. ? Gas/ GI issues- trial of probiotics Restora SX #2 boxes given take AD,  if no relief ref GI to evaluate for gastroparesis with DM

## 2014-04-16 NOTE — Patient Instructions (Signed)
FYI  Gastroparesis  Gastroparesis is also called slowed stomach emptying (delayed gastric emptying). It is a condition in which the stomach takes too long to empty its contents. It often happens in people with diabetes.  CAUSES  Gastroparesis happens when nerves to the stomach are damaged or stop working. When the nerves are damaged, the muscles of the stomach and intestines do not work normally. The movement of food is slowed or stopped. High blood glucose (sugar) causes changes in nerves and can damage the blood vessels that carry oxygen and nutrients to the nerves. RISK FACTORS  Diabetes.  Post-viral syndromes.  Eating disorders (anorexia, bulimia).  Surgery on the stomach or vagus nerve.  Gastroesophageal reflux disease (rarely).  Smooth muscle disorders (amyloidosis, scleroderma).  Metabolic disorders, including hypothyroidism.  Parkinson disease. SYMPTOMS   Heartburn.  Feeling sick to your stomach (nausea).  Vomiting of undigested food.  An early feeling of fullness when eating.  Weight loss.  Abdominal bloating.  Erratic blood glucose levels.  Lack of appetite.  Gastroesophageal reflux.  Spasms of the stomach wall. Complications can include:  Bacterial overgrowth in stomach. Food stays in the stomach and can ferment and cause bacteria to grow.  Weight loss due to difficulty digesting and absorbing nutrients.  Vomiting.  Obstruction in the stomach. Undigested food can harden and cause nausea and vomiting.  Blood glucose fluctuations caused by inconsistent food absorption. DIAGNOSIS  The diagnosis of gastroparesis is confirmed through one or more of the following tests:  Barium X-rays and scans. These tests look at how long it takes for food to move through the stomach.  Gastric manometry. This test measures electrical and muscular activity in the stomach. A thin tube is passed down the throat into the stomach. The tube contains a wire that takes  measurements of the stomach's electrical and muscular activity as it digests liquids and solid food.  Endoscopy. This procedure is done with a long, thin tube called an endoscope. It is passed through the mouth and gently down the esophagus into the stomach. This tube helps the caregiver look at the lining of the stomach to check for any abnormalities.  Ultrasonography. This can rule out gallbladder disease or pancreatitis. This test will outline and define the shape of the gallbladder and pancreas. TREATMENT   Treatments may include:  Exercise.  Medicines to control nausea and vomiting.  Medicines to stimulate stomach muscles.  Changes in what and when you eat.  Having smaller meals more often.  Eating low-fiber forms of high-fiber foods, such as eating cooked vegetables instead of raw vegetables.  Eating low-fat foods.  Consuming liquids, which are easier to digest.  In severe cases, feeding tubes and intravenous (IV) feeding may be needed. It is important to note that in most cases, treatment does not cure gastroparesis. It is usually a lasting (chronic) condition. Treatment helps you manage the underlying condition so that you can be as healthy and comfortable as possible. Other treatments  A gastric neurostimulator has been developed to assist people with gastroparesis. The battery-operated device is surgically implanted. It emits mild electrical pulses to help improve stomach emptying and to control nausea and vomiting.  The use of botulinum toxin has been shown to improve stomach emptying by decreasing the prolonged contractions of the muscle between the stomach and the small intestine (pyloric sphincter). The benefits are temporary. SEEK MEDICAL CARE IF:   You have diabetes and you are having problems keeping your blood glucose in goal range.  You are  having nausea, vomiting, bloating, or early feelings of fullness with eating.  Your symptoms do not change with a change  in diet. Document Released: 09/24/2005 Document Revised: 01/19/2013 Document Reviewed: 03/03/2009 [facility] Patient Information 2015 [place], [place]. This information is not intended to replace advice given to you by your health care provider. Make sure you discuss any questions you have with your health care provider.

## 2014-07-20 ENCOUNTER — Ambulatory Visit (INDEPENDENT_AMBULATORY_CARE_PROVIDER_SITE_OTHER): Payer: Medicare Other | Admitting: Physician Assistant

## 2014-07-20 ENCOUNTER — Ambulatory Visit: Payer: Self-pay | Admitting: Internal Medicine

## 2014-07-20 VITALS — BP 128/68 | HR 48 | Temp 97.9°F | Resp 16 | Ht 68.5 in | Wt 194.0 lb

## 2014-07-20 DIAGNOSIS — E1122 Type 2 diabetes mellitus with diabetic chronic kidney disease: Secondary | ICD-10-CM

## 2014-07-20 DIAGNOSIS — I10 Essential (primary) hypertension: Secondary | ICD-10-CM

## 2014-07-20 DIAGNOSIS — Z0001 Encounter for general adult medical examination with abnormal findings: Secondary | ICD-10-CM

## 2014-07-20 DIAGNOSIS — E782 Mixed hyperlipidemia: Secondary | ICD-10-CM

## 2014-07-20 DIAGNOSIS — N182 Chronic kidney disease, stage 2 (mild): Secondary | ICD-10-CM

## 2014-07-20 DIAGNOSIS — E559 Vitamin D deficiency, unspecified: Secondary | ICD-10-CM

## 2014-07-20 DIAGNOSIS — Z9181 History of falling: Secondary | ICD-10-CM

## 2014-07-20 DIAGNOSIS — R6889 Other general symptoms and signs: Secondary | ICD-10-CM

## 2014-07-20 DIAGNOSIS — Z79899 Other long term (current) drug therapy: Secondary | ICD-10-CM

## 2014-07-20 LAB — CBC WITH DIFFERENTIAL/PLATELET
BASOS ABS: 0.1 10*3/uL (ref 0.0–0.1)
Basophils Relative: 1 % (ref 0–1)
EOS PCT: 8 % — AB (ref 0–5)
Eosinophils Absolute: 0.7 10*3/uL (ref 0.0–0.7)
HCT: 41.6 % (ref 39.0–52.0)
Hemoglobin: 14.4 g/dL (ref 13.0–17.0)
Lymphocytes Relative: 25 % (ref 12–46)
Lymphs Abs: 2.3 10*3/uL (ref 0.7–4.0)
MCH: 29.3 pg (ref 26.0–34.0)
MCHC: 34.6 g/dL (ref 30.0–36.0)
MCV: 84.6 fL (ref 78.0–100.0)
MONO ABS: 0.8 10*3/uL (ref 0.1–1.0)
Monocytes Relative: 9 % (ref 3–12)
Neutro Abs: 5.2 10*3/uL (ref 1.7–7.7)
Neutrophils Relative %: 57 % (ref 43–77)
PLATELETS: 272 10*3/uL (ref 150–400)
RBC: 4.92 MIL/uL (ref 4.22–5.81)
RDW: 14 % (ref 11.5–15.5)
WBC: 9.1 10*3/uL (ref 4.0–10.5)

## 2014-07-20 LAB — HEPATIC FUNCTION PANEL
ALK PHOS: 53 U/L (ref 39–117)
ALT: 13 U/L (ref 0–53)
AST: 14 U/L (ref 0–37)
Albumin: 4.4 g/dL (ref 3.5–5.2)
Bilirubin, Direct: 0.1 mg/dL (ref 0.0–0.3)
Indirect Bilirubin: 0.6 mg/dL (ref 0.2–1.2)
TOTAL PROTEIN: 6.7 g/dL (ref 6.0–8.3)
Total Bilirubin: 0.7 mg/dL (ref 0.2–1.2)

## 2014-07-20 LAB — BASIC METABOLIC PANEL WITH GFR
BUN: 18 mg/dL (ref 6–23)
CHLORIDE: 103 meq/L (ref 96–112)
CO2: 25 mEq/L (ref 19–32)
CREATININE: 1.12 mg/dL (ref 0.50–1.35)
Calcium: 9.8 mg/dL (ref 8.4–10.5)
GFR, EST NON AFRICAN AMERICAN: 67 mL/min
GFR, Est African American: 77 mL/min
Glucose, Bld: 82 mg/dL (ref 70–99)
Potassium: 4.9 mEq/L (ref 3.5–5.3)
Sodium: 139 mEq/L (ref 135–145)

## 2014-07-20 LAB — LIPID PANEL
Cholesterol: 169 mg/dL (ref 0–200)
HDL: 46 mg/dL (ref >39)
LDL CALC: 97 mg/dL (ref 0–99)
Total CHOL/HDL Ratio: 3.7 Ratio
Triglycerides: 132 mg/dL (ref <150)
VLDL: 26 mg/dL (ref 0–40)

## 2014-07-20 LAB — TSH: TSH: 1.565 u[IU]/mL (ref 0.350–4.500)

## 2014-07-20 LAB — MAGNESIUM: MAGNESIUM: 1.9 mg/dL (ref 1.5–2.5)

## 2014-07-20 LAB — HEMOGLOBIN A1C
HEMOGLOBIN A1C: 6.1 % — AB (ref <5.7)
Mean Plasma Glucose: 128 mg/dL — ABNORMAL HIGH (ref <117)

## 2014-07-20 NOTE — Patient Instructions (Signed)
Benefiber is good for constipation/diarrhea/irritable bowel syndrome, it helps with weight loss and can help lower your bad cholesterol. Please do 1-2 TBSP in the morning in water, coffee, or tea. It can take up to a month before you can see a difference with your bowel movements. It is cheapest from [employer], [employer], [employer].    Preventative Care for Adults, Male       REGULAR HEALTH EXAMS:  A routine yearly physical is a good way to check in with your primary care provider about your health and preventive screening. It is also an opportunity to share updates about your health and any concerns you have, and receive a thorough all-over exam.   Most health insurance companies pay for at least some preventative services.  Check with your health plan for specific coverages.  WHAT PREVENTATIVE SERVICES DO MEN NEED?  Adult men should have their weight and blood pressure checked regularly.   Men age 100 and older should have their cholesterol levels checked regularly.  Beginning at age 61 and continuing to age 46, men should be screened for colorectal cancer.  Certain people should may need continued testing until age 73.  Other cancer screening may include exams for testicular and prostate cancer.  Updating vaccinations is part of preventative care.  Vaccinations help protect against diseases such as the flu.  Lab tests are generally done as part of preventative care to screen for anemia and blood disorders, to screen for problems with the kidneys and liver, to screen for bladder problems, to check blood sugar, and to check your cholesterol level.  Preventative services generally include counseling about diet, exercise, avoiding tobacco, drugs, excessive alcohol consumption, and sexually transmitted infections.    GENERAL RECOMMENDATIONS FOR GOOD HEALTH:  Healthy diet:  Eat a variety of foods, including fruit, vegetables, animal or vegetable protein, such as meat, fish, chicken, and eggs, or  beans, lentils, tofu, and grains, such as rice.  Drink plenty of water daily.  Decrease saturated fat in the diet, avoid lots of red meat, processed foods, sweets, fast foods, and fried foods.  Exercise:  Aerobic exercise helps maintain good heart health. At least 30-40 minutes of moderate-intensity exercise is recommended. For example, a brisk walk that increases your heart rate and breathing. This should be done on most days of the week.   Find a type of exercise or a variety of exercises that you enjoy so that it becomes a part of your daily life.  Examples are running, walking, swimming, water aerobics, and biking.  For motivation and support, explore group exercise such as aerobic class, spin class, Zumba, Yoga,or  martial arts, etc.    Set exercise goals for yourself, such as a certain weight goal, walk or run in a race such as a 5k walk/run.  Speak to your primary care provider about exercise goals.  Disease prevention:  If you smoke or chew tobacco, find out from your caregiver how to quit. It can literally save your life, no matter how long you have been a tobacco user. If you do not use tobacco, never begin.   Maintain a healthy diet and normal weight. Increased weight leads to problems with blood pressure and diabetes.   The Body Mass Index or BMI is a way of measuring how much of your body is fat. Having a BMI above 27 increases the risk of heart disease, diabetes, hypertension, stroke and other problems related to obesity. Your caregiver can help determine your BMI and based on  it develop an exercise and dietary program to help you achieve or maintain this important measurement at a healthful level.  High blood pressure causes heart and blood vessel problems.  Persistent high blood pressure should be treated with medicine if weight loss and exercise do not work.   Fat and cholesterol leaves deposits in your arteries that can block them. This causes heart disease and vessel  disease elsewhere in your body.  If your cholesterol is found to be high, or if you have heart disease or certain other medical conditions, then you may need to have your cholesterol monitored frequently and be treated with medication.   Ask if you should have a stress test if your history suggests this. A stress test is a test done on a treadmill that looks for heart disease. This test can find disease prior to there being a problem.  Avoid drinking alcohol in excess (more than two drinks per day).  Avoid use of street drugs. Do not share needles with anyone. Ask for professional help if you need assistance or instructions on stopping the use of alcohol, cigarettes, and/or drugs.  Brush your teeth twice a day with fluoride toothpaste, and floss once a day. Good oral hygiene prevents tooth decay and gum disease. The problems can be painful, unattractive, and can cause other health problems. Visit your dentist for a routine oral and dental check up and preventive care every 6-12 months.   Look at your skin regularly.  Use a mirror to look at your back. Notify your caregivers of changes in moles, especially if there are changes in shapes, colors, a size larger than a pencil eraser, an irregular border, or development of new moles.  Safety:  Use seatbelts 100% of the time, whether driving or as a passenger.  Use safety devices such as hearing protection if you work in environments with loud noise or significant background noise.  Use safety glasses when doing any work that could send debris in to the eyes.  Use a helmet if you ride a bike or motorcycle.  Use appropriate safety gear for contact sports.  Talk to your caregiver about gun safety.  Use sunscreen with a SPF (or skin protection factor) of 15 or greater.  Lighter skinned people are at a greater risk of skin cancer. Don't forget to also wear sunglasses in order to protect your eyes from too much damaging sunlight. Damaging sunlight can accelerate  cataract formation.   Practice safe sex. Use condoms. Condoms are used for birth control and to help reduce the spread of sexually transmitted infections (or STIs).  Some of the STIs are gonorrhea (the clap), chlamydia, syphilis, trichomonas, herpes, HPV (human papilloma virus) and HIV (human immunodeficiency virus) which causes AIDS. The herpes, HIV and HPV are viral illnesses that have no cure. These can result in disability, cancer and death.   Keep carbon monoxide and smoke detectors in your home functioning at all times. Change the batteries every 6 months or use a model that plugs into the wall.   Vaccinations:  Stay up to date with your tetanus shots and other required immunizations. You should have a booster for tetanus every 10 years. Be sure to get your flu shot every year, since 5%-20% of the [place] population comes down with the flu. The flu vaccine changes each year, so being vaccinated once is not enough. Get your shot in the fall, before the flu season peaks.   Other vaccines to consider:  Pneumococcal vaccine to  protect against certain types of pneumonia.  This is normally recommended for adults age 65 or older.  However, adults younger than 70 years old with certain underlying conditions such as diabetes, heart or lung disease should also receive the vaccine.  Shingles vaccine to protect against Varicella Zoster if you are older than age 72, or younger than 70 years old with certain underlying illness.  Hepatitis A vaccine to protect against a form of infection of the liver by a virus acquired from food.  Hepatitis B vaccine to protect against a form of infection of the liver by a virus acquired from blood or body fluids, particularly if you work in health care.  If you plan to travel internationally, check with your local health department for specific vaccination recommendations.  Cancer Screening:  Most routine colon cancer screening begins at the age of 14. On a yearly  basis, doctors may provide special easy to use take-home tests to check for hidden blood in the stool. Sigmoidoscopy or colonoscopy can detect the earliest forms of colon cancer and is life saving. These tests use a small camera at the end of a tube to directly examine the colon. Speak to your caregiver about this at age 76, when routine screening begins (and is repeated every 5 years unless early forms of pre-cancerous polyps or small growths are found).   At the age of 62 men usually start screening for prostate cancer every year. Screening may begin at a younger age for those with higher risk. Those at higher risk include African-Americans or having a family history of prostate cancer. There are two types of tests for prostate cancer:   Prostate-specific antigen (PSA) testing. Recent studies raise questions about prostate cancer using PSA and you should discuss this with your caregiver.   Digital rectal exam (in which your doctor's lubricated and gloved finger feels for enlargement of the prostate through the anus).   Screening for testicular cancer.  Do a monthly exam of your testicles. Gently roll each testicle between your thumb and fingers, feeling for any abnormal lumps. The best time to do this is after a hot shower or bath when the tissues are looser. Notify your caregivers of any lumps, tenderness or changes in size or shape immediately.

## 2014-07-20 NOTE — Progress Notes (Signed)
MEDICARE ANNUAL WELLNESS VISIT AND FOLLOW UP Assessment:   1. Essential hypertension - CBC with Differential - BASIC METABOLIC PANEL WITH GFR - Hepatic function panel - TSH  2. Hyperlipidemia - Lipid panel  3. Vitamin D deficiency - Vit D  25 hydroxy (rtn osteoporosis monitoring)  4. Encounter for long-term (current) use of medications - Magnesium  5. CKD stage 2 due to type 2 diabetes mellitus Discussed general issues about diabetes pathophysiology and management., Educational material distributed., Suggested low cholesterol diet., Encouraged aerobic exercise., Discussed foot care., Reminded to get yearly retinal exam. - Hemoglobin A1c - HM DIABETES FOOT EXAM  6. Encounter for general adult medical examination with abnormal findings  7. At low risk for fall    Plan:   During the course of the visit the patient was educated and counseled about appropriate screening and preventive services including:    Pneumococcal vaccine   Influenza vaccine  Td vaccine  Screening electrocardiogram  Colorectal cancer screening  Diabetes screening  Glaucoma screening  Nutrition counseling   Screening recommendations, referrals: Vaccinations: Please see documentation below and orders this visit.  Nutrition assessed and recommended  Colonoscopy not indicated Recommended yearly ophthalmology/optometry visit for glaucoma screening and checkup Recommended yearly dental visit for hygiene and checkup Advanced directives - requested  Conditions/risks identified: BMI: Discussed weight loss, diet, and increase physical activity.  Increase physical activity: AHA recommends 150 minutes of physical activity a week.  Medications reviewed Diabetes is at goal, ACE/ARB therapy: Yes. Urinary Incontinence is not an issue: discussed non pharmacology and pharmacology options.  Fall risk: low- discussed PT, home fall assessment, medications.    Subjective:  Daniel Burnett is a 68 y.o.  male who presents for Medicare Annual Wellness Visit and 3 month follow up for HTN, hyperlipidemia, diabetes with CKD, and vitamin D Def.  Date of last medicare wellness visit was is unknown.  His blood pressure has been controlled at home, today their BP is BP: 128/68 mmHg He does workout. He denies chest pain, shortness of breath, dizziness.  He is on cholesterol medication and denies myalgias. His cholesterol is at goal. The cholesterol last visit was:   Lab Results  Component Value Date   CHOL 187 04/16/2014   HDL 51 04/16/2014   LDLCALC 113* 04/16/2014   TRIG 116 04/16/2014   CHOLHDL 3.7 04/16/2014   He has been working on diet and exercise for diabetes with CKD stage II due to his dm, and denies nausea, paresthesia of the feet, polydipsia and polyuria. Last A1C in the office was:  Lab Results  Component Value Date   HGBA1C 6.1* 04/16/2014   Patient is on Vitamin D supplement.   Lab Results  Component Value Date   VD25OH 19 01/04/2014      Names of Other Physician/Practitioners you currently use: 1. [facility] here for primary care 2. Dr. Delman Cheadle, eye doctor, last visit yealy Patient Care Team: Unk Pinto, MD as PCP - General (Internal Medicine)  Medication Review: Current Outpatient Prescriptions on File Prior to Visit  Medication Sig Dispense Refill  . aspirin 81 MG tablet Take 81 mg by mouth daily.      Marland Kitchen atenolol (TENORMIN) 100 MG tablet Take 1 tablet (100 mg total) by mouth daily.  90 tablet  3  . benazepril (LOTENSIN) 40 MG tablet TAKE 1 TABLET BY MOUTH EVERY DAY FOR BLOOD PRESSURE  90 tablet  1  . cholecalciferol (VITAMIN D) 1000 UNITS tablet Take 8,000  Units by mouth daily.      Marland Kitchen glipiZIDE (GLUCOTROL) 5 MG tablet TAKE 1/2 TO 1 TABLET BY MOUTH TWICE DAILY WITH MEALS  180 tablet  2  . magnesium gluconate (MAGONATE) 500 MG tablet Take 500 mg by mouth daily.      . metFORMIN (GLUCOPHAGE-XR) 500 MG 24 hr tablet TAKE 1-2 TABLETS TWICE  A DAY FOR DIABETES  360 tablet  1  . Omega-3 Fatty Acids (FISH OIL PO) Take 12,002 mg by mouth 2 (two) times daily.       . Red Yeast Rice 600 MG CAPS Take 600 mg by mouth. Takes 4 per day       No current facility-administered medications on file prior to visit.    Current Problems (verified) Patient Active Problem List   Diagnosis Date Noted  . Encounter for long-term (current) use of medications 11/06/2013  . Essential hypertension 09/18/2013  . Hyperlipidemia 09/18/2013  . CKD stage 2 due to type 2 diabetes mellitus 09/18/2013  . Vitamin D deficiency 09/18/2013    Screening Tests Health Maintenance  Topic Date Due  . Foot Exam  08/06/1954  . Ophthalmology Exam  08/06/1954  . Tetanus/tdap  08/07/1963  . Zostavax  08/06/2004  . Influenza Vaccine  05/08/2014  . Urine Microalbumin  09/18/2014  . Hemoglobin A1c  10/17/2014  . Colonoscopy  06/08/2017  . Pneumococcal Polysaccharide Vaccine Age 30 And Over  Completed    Immunization History  Administered Date(s) Administered  . DT 02/09/2014  . DTaP 01/06/2005  . Influenza Whole 09/18/2012  . Pneumococcal Polysaccharide-23 09/18/2012    Preventative care: Last colonoscopy: 2013  Prior vaccinations: TD or Tdap: 2015  Influenza: 2013 declines Pneumococcal: 2013 Shingles/Zostavax: declines  History reviewed: allergies, current medications, past family history, past medical history, past social history, past surgical history and problem list   Risk Factors: Tobacco History  Substance Use Topics  . Smoking status: Former Smoker    Quit date: 10/08/1962  . Smokeless tobacco: Not on file  . Alcohol Use: No   He does not smoke.  Patient is a former smoker. Are there smokers in your home (other than you)?  No  Alcohol Current alcohol use: none  Caffeine Current caffeine use: coffee 1 /day  Exercise Current exercise: walking and yard work  Nutrition/Diet Current diet: in general, a "healthy" diet     Cardiac risk factors: advanced age (older than 72 for men, 54 for women), diabetes mellitus, dyslipidemia, family history of premature cardiovascular disease, hypertension and male gender.  Depression Screen (Note: if answer to either of the following is "Yes", a more complete depression screening is indicated)   Q1: Over the past two weeks, have you felt down, depressed or hopeless? No  Q2: Over the past two weeks, have you felt little interest or pleasure in doing things? No  Have you lost interest or pleasure in daily life? No  Do you often feel hopeless? No  Do you cry easily over simple problems? No  Activities of Daily Living In your present state of health, do you have any difficulty performing the following activities?:  Driving? No Managing money?  No Feeding yourself? No Getting from bed to chair? No Climbing a flight of stairs? No Preparing food and eating?: No Bathing or showering? No Getting dressed: No Getting to the toilet? No Using the toilet:No Moving around from place to place: No In the past year have you fallen or had a near fall?:No   Are you  sexually active?  No  Do you have more than one partner?  No  Vision Difficulties: No  Hearing Difficulties: Yes Do you often ask people to speak up or repeat themselves? Yes Do you experience ringing or noises in your ears? No Do you have difficulty understanding soft or whispered voices? Yes  Cognition  Do you feel that you have a problem with memory?No  Do you often misplace items? No  Do you feel safe at home?  Yes  Advanced directives Does patient have a [place]? No Does patient have a Living Will? No   Objective:   Blood pressure 128/68, pulse 48, temperature 97.9 F (36.6 C), resp. rate 16, height 5' 8.5" (1.74 m), weight 194 lb (87.998 kg). Body mass index is 29.07 kg/(m^2).  General appearance: alert, no distress, WD/WN, male Cognitive Testing  Alert? Yes  Normal  Appearance?Yes  Oriented to person? Yes  Place? Yes   Time? Yes  Recall of three objects?  Yes  Can perform simple calculations? Yes  Displays appropriate judgment?Yes  Can read the correct time from a watch face?Yes  HEENT: normocephalic, sclerae anicteric, TMs pearly, nares patent, no discharge or erythema, pharynx normal Oral cavity: MMM, no lesions Neck: supple, no lymphadenopathy, no thyromegaly, no masses Heart: RRR, normal S1, S2, no murmurs Lungs: CTA bilaterally, no wheezes, rhonchi, or rales Abdomen: +bs, soft, non tender, non distended, no masses, no hepatomegaly, no splenomegaly Musculoskeletal: nontender, no swelling, no obvious deformity Extremities: no edema, no cyanosis, no clubbing Pulses: 2+ symmetric, upper and lower extremities, normal cap refill Neurological: alert, oriented x 3, CN2-12 intact, strength normal upper extremities and lower extremities, sensation normal throughout, DTRs 2+ throughout, no cerebellar signs, gait normal Psychiatric: normal affect, behavior normal, pleasant   Medicare Attestation I have personally reviewed: The patient's medical and social history Their use of alcohol, tobacco or illicit drugs Their current medications and supplements The patient's functional ability including ADLs,fall risks, home safety risks, cognitive, and hearing and visual impairment Diet and physical activities Evidence for depression or mood disorders  The patient's weight, height, BMI, and visual acuity have been recorded in the chart.  I have made referrals, counseling, and provided education to the patient based on review of the above and I have provided the patient with a written personalized care plan for preventive services.     Vicie Mutters, PA-C   07/20/2014

## 2014-07-21 LAB — VITAMIN D 25 HYDROXY (VIT D DEFICIENCY, FRACTURES): VIT D 25 HYDROXY: 73 ng/mL (ref 30–89)

## 2014-07-25 ENCOUNTER — Other Ambulatory Visit: Payer: Self-pay | Admitting: Internal Medicine

## 2014-08-10 ENCOUNTER — Other Ambulatory Visit: Payer: Self-pay | Admitting: *Deleted

## 2014-08-10 MED ORDER — GLUCOSE BLOOD VI STRP: ORAL_STRIP | Status: DC

## 2014-09-18 ENCOUNTER — Other Ambulatory Visit: Payer: Self-pay | Admitting: Physician Assistant

## 2014-09-22 ENCOUNTER — Encounter: Payer: Self-pay | Admitting: Internal Medicine

## 2014-10-02 ENCOUNTER — Other Ambulatory Visit: Payer: Self-pay | Admitting: Internal Medicine

## 2014-10-20 ENCOUNTER — Encounter: Payer: Self-pay | Admitting: Internal Medicine

## 2014-10-20 ENCOUNTER — Ambulatory Visit (INDEPENDENT_AMBULATORY_CARE_PROVIDER_SITE_OTHER): Payer: PPO | Admitting: Internal Medicine

## 2014-10-20 VITALS — BP 132/74 | HR 48 | Temp 98.2°F | Resp 16 | Ht 69.25 in | Wt 196.2 lb

## 2014-10-20 DIAGNOSIS — Z125 Encounter for screening for malignant neoplasm of prostate: Secondary | ICD-10-CM

## 2014-10-20 DIAGNOSIS — E559 Vitamin D deficiency, unspecified: Secondary | ICD-10-CM

## 2014-10-20 DIAGNOSIS — Z1331 Encounter for screening for depression: Secondary | ICD-10-CM

## 2014-10-20 DIAGNOSIS — I10 Essential (primary) hypertension: Secondary | ICD-10-CM

## 2014-10-20 DIAGNOSIS — Z1212 Encounter for screening for malignant neoplasm of rectum: Secondary | ICD-10-CM

## 2014-10-20 DIAGNOSIS — Z9181 History of falling: Secondary | ICD-10-CM

## 2014-10-20 DIAGNOSIS — Z79899 Other long term (current) drug therapy: Secondary | ICD-10-CM

## 2014-10-20 DIAGNOSIS — E1121 Type 2 diabetes mellitus with diabetic nephropathy: Secondary | ICD-10-CM

## 2014-10-20 DIAGNOSIS — E782 Mixed hyperlipidemia: Secondary | ICD-10-CM

## 2014-10-20 LAB — HEMOGLOBIN A1C
Hgb A1c MFr Bld: 6.3 % — ABNORMAL HIGH (ref <5.7)
MEAN PLASMA GLUCOSE: 134 mg/dL — AB (ref <117)

## 2014-10-20 NOTE — Patient Instructions (Signed)
 Recommend the book "The END of DIETING" by Dr Joel Furman   and the book "The END of DIABETES " by Dr Joel Fuhrman  At [employer] - get book & Audio CD's      Being diabetic has a  300% increased risk for heart attack, stroke, cancer, and alzheimer- type vascular dementia. It is very important that you work harder with diet by avoiding all foods that are white except chicken & fish. Avoid white rice (brown & wild rice is OK), white potatoes (sweetpotatoes in moderation is OK), White bread or wheat bread or anything made out of white flour like bagels, donuts, rolls, buns, biscuits, cakes, pastries, cookies, pizza crust, and pasta (made from white flour & egg whites) - vegetarian pasta or spinach or wheat pasta is OK. Multigrain breads like Dixie[employer] or [employer], or multigrain sandwich thins or flatbreads.  Diet, exercise and weight loss can reverse and cure diabetes in the early stages.  Diet, exercise and weight loss is very important in the control and prevention of complications of diabetes which affects every system in your body, ie. Brain - dementia/stroke, eyes - glaucoma/blindness, heart - heart attack/heart failure, kidneys - dialysis, stomach - gastric paralysis, intestines - malabsorption, nerves - severe painful neuritis, circulation - gangrene & loss of a leg(s), and finally cancer and Alzheimers.    I recommend avoid fried & greasy foods,  sweets/candy, white rice (brown or wild rice or Quinoa is OK), white potatoes (sweet potatoes are OK) - anything made from white flour - bagels, doughnuts, rolls, buns, biscuits,white and wheat breads, pizza crust and traditional pasta made of white flour & egg white(vegetarian pasta or spinach or wheat pasta is OK).  Multi-grain bread is OK - like multi-grain flat bread or sandwich thins. Avoid alcohol in excess. Exercise is also important.    Eat all the vegetables you want - avoid meat, especially red meat and dairy - especially cheese.  Cheese  is the most concentrated form of trans-fats which is the worst thing to clog up our arteries. Veggie cheese is OK which can be found in the fresh produce section at [employer] or [employer] or [employer]  Preventive Care for Adults A healthy lifestyle and preventive care can promote health and wellness. Preventive health guidelines for men include the following key practices:  A routine yearly physical is a good way to check with your health care provider about your health and preventative screening. It is a chance to share any concerns and updates on your health and to receive a thorough exam.  Visit your dentist for a routine exam and preventative care every 6 months. Brush your teeth twice a day and floss once a day. Good oral hygiene prevents tooth decay and gum disease.  The frequency of eye exams is based on your age, health, family medical history, use of contact lenses, and other factors. Follow your health care provider's recommendations for frequency of eye exams.  Eat a healthy diet. Foods such as vegetables, fruits, whole grains, low-fat dairy products, and lean protein foods contain the nutrients you need without too many calories. Decrease your intake of foods high in solid fats, added sugars, and salt. Eat the right amount of calories for you.Get information about a proper diet from your health care provider, if necessary.  Regular physical exercise is one of the most important things you can do for your health. Most adults should get at least 150 minutes of moderate-intensity exercise (any activity that   increases your heart rate and causes you to sweat) each week. In addition, most adults need muscle-strengthening exercises on 2 or more days a week.  Maintain a healthy weight. The body mass index (BMI) is a screening tool to identify possible weight problems. It provides an estimate of body fat based on height and weight. Your health care provider can find your BMI and can help you  achieve or maintain a healthy weight.For adults 20 years and older:  A BMI below 18.5 is considered underweight.  A BMI of 18.5 to 24.9 is normal.  A BMI of 25 to 29.9 is considered overweight.  A BMI of 30 and above is considered obese.  Maintain normal blood lipids and cholesterol levels by exercising and minimizing your intake of saturated fat. Eat a balanced diet with plenty of fruit and vegetables. Blood tests for lipids and cholesterol should begin at age 20 and be repeated every 5 years. If your lipid or cholesterol levels are high, you are over 50, or you are at high risk for heart disease, you may need your cholesterol levels checked more frequently.Ongoing high lipid and cholesterol levels should be treated with medicines if diet and exercise are not working.  If you smoke, find out from your health care provider how to quit. If you do not use tobacco, do not start.  Lung cancer screening is recommended for adults aged 55-80 years who are at high risk for developing lung cancer because of a history of smoking. A yearly low-dose CT scan of the lungs is recommended for people who have at least a 30-pack-year history of smoking and are a current smoker or have quit within the past 15 years. A pack year of smoking is smoking an average of 1 pack of cigarettes a day for 1 year (for example: 1 pack a day for 30 years or 2 packs a day for 15 years). Yearly screening should continue until the smoker has stopped smoking for at least 15 years. Yearly screening should be stopped for people who develop a health problem that would prevent them from having lung cancer treatment.  If you choose to drink alcohol, do not have more than 2 drinks per day. One drink is considered to be 12 ounces (355 mL) of beer, 5 ounces (148 mL) of wine, or 1.5 ounces (44 mL) of liquor.  Avoid use of street drugs. Do not share needles with anyone. Ask for help if you need support or instructions about stopping the use of  drugs.  High blood pressure causes heart disease and increases the risk of stroke. Your blood pressure should be checked at least every 1-2 years. Ongoing high blood pressure should be treated with medicines, if weight loss and exercise are not effective.  If you are 45-79 years old, ask your health care provider if you should take aspirin to prevent heart disease.  Diabetes screening involves taking a blood sample to check your fasting blood sugar level. Testing should be considered at a younger age or be carried out more frequently if you are overweight and have at least 1 risk factor for diabetes.  Colorectal cancer can be detected and often prevented. Most routine colorectal cancer screening begins at the age of 50 and continues through age 75. However, your health care provider may recommend screening at an earlier age if you have risk factors for colon cancer. On a yearly basis, your health care provider may provide home test kits to check for hidden blood in   the stool. Use of a small camera at the end of a tube to directly examine the colon (sigmoidoscopy or colonoscopy) can detect the earliest forms of colorectal cancer. Talk to your health care provider about this at age 50, when routine screening begins. Direct exam of the colon should be repeated every 5-10 years through age 75, unless early forms of precancerous polyps or small growths are found.  Hepatitis C blood testing is recommended for all people born from 1945 through 1965 and any individual with known risks for hepatitis C.  Screening for abdominal aortic aneurysm (AAA)  by ultrasound is recommended for people who have history of high blood pressure or who are current or former smokers.  Healthy men should  receive prostate-specific antigen (PSA) blood tests as part of routine cancer screening. Talk with your health care provider about prostate cancer screening.  Testicular cancer screening is  recommended for adult males.  Screening includes self-exam, a health care provider exam, and other screening tests. Consult with your health care provider about any symptoms you have or any concerns you have about testicular cancer.  Use sunscreen. Apply sunscreen liberally and repeatedly throughout the day. You should seek shade when your shadow is shorter than you. Protect yourself by wearing long sleeves, pants, a wide-brimmed hat, and sunglasses year round, whenever you are outdoors.  Once a month, do a whole-body skin exam, using a mirror to look at the skin on your back. Tell your health care provider about new moles, moles that have irregular borders, moles that are larger than a pencil eraser, or moles that have changed in shape or color.  Stay current with required vaccines (immunizations).  Influenza vaccine. All adults should be immunized every year.  Tetanus, diphtheria, and acellular pertussis (Td, Tdap) vaccine. An adult who has not previously received Tdap or who does not know his vaccine status should receive 1 dose of Tdap. This initial dose should be followed by tetanus and diphtheria toxoids (Td) booster doses every 10 years. Adults with an unknown or incomplete history of completing a 3-dose immunization series with Td-containing vaccines should begin or complete a primary immunization series including a Tdap dose. Adults should receive a Td booster every 10 years.  Zoster vaccine. One dose is recommended for adults aged 60 years or older unless certain conditions are present.    PREVNAR - Pneumococcal 13-valent conjugate (PCV13) vaccine. When indicated, a person who is uncertain of his immunization history and has no record of immunization should receive the PCV13 vaccine. An adult aged 19 years or older who has certain medical conditions and has not been previously immunized should receive 1 dose of PCV13 vaccine. This PCV13 should be followed with a dose of pneumococcal polysaccharide (PPSV23) vaccine. The  PPSV23 vaccine dose should be obtained at least 8 weeks after the dose of PCV13 vaccine. An adult aged 19 years or older who has certain medical conditions and previously received 1 or more doses of PPSV23 vaccine should receive 1 dose of PCV13. The PCV13 vaccine dose should be obtained 1 or more years after the last PPSV23 vaccine dose.    PNEUMOVAX - Pneumococcal polysaccharide (PPSV23) vaccine. When PCV13 is also indicated, PCV13 should be obtained first. All adults aged 65 years and older should be immunized. An adult younger than age 65 years who has certain medical conditions should be immunized. Any person who resides in a nursing home or long-term care facility should be immunized. An adult smoker should be immunized. People   with an immunocompromised condition and certain other conditions should receive both PCV13 and PPSV23 vaccines. People with human immunodeficiency virus (HIV) infection should be immunized as soon as possible after diagnosis. Immunization during chemotherapy or radiation therapy should be avoided. Routine use of PPSV23 vaccine is not recommended for American Indians, [place] Natives, or people younger than 65 years unless there are medical conditions that require PPSV23 vaccine. When indicated, people who have unknown immunization and have no record of immunization should receive PPSV23 vaccine. One-time revaccination 5 years after the first dose of PPSV23 is recommended for people aged 19-64 years who have chronic kidney failure, nephrotic syndrome, asplenia, or immunocompromised conditions. People who received 1-2 doses of PPSV23 before age 65 years should receive another dose of PPSV23 vaccine at age 65 years or later if at least 5 years have passed since the previous dose. Doses of PPSV23 are not needed for people immunized with PPSV23 at or after age 65 years.    Hepatitis A vaccine. Adults who wish to be protected from this disease, have certain high-risk conditions, work  with hepatitis A-infected animals, work in hepatitis A research labs, or travel to or work in countries with a high rate of hepatitis A should be immunized. Adults who were previously unvaccinated and who anticipate close contact with an international adoptee during the first 60 days after arrival in the [place] from a country with a high rate of hepatitis A should be immunized.    Hepatitis B vaccine. Adults should be immunized if they wish to be protected from this disease, have certain high-risk conditions, may be exposed to blood or other infectious body fluids, are household contacts or sex partners of hepatitis B positive people, are clients or workers in certain care facilities, or travel to or work in countries with a high rate of hepatitis B.   Preventive Service / Frequency   Ages 65 and over  Blood pressure check.  Lipid and cholesterol check.  Lung cancer screening. / Every year if you are aged 55-80 years and have a 30-pack-year history of smoking and currently smoke or have quit within the past 15 years. Yearly screening is stopped once you have quit smoking for at least 15 years or develop a health problem that would prevent you from having lung cancer treatment.  Fecal occult blood test (FOBT) of stool. You may not have to do this test if you get a colonoscopy every 10 years.  Flexible sigmoidoscopy** or colonoscopy.** / Every 5 years for a flexible sigmoidoscopy or every 10 years for a colonoscopy beginning at age 50 and continuing until age 75.  Hepatitis C blood test.** / For all people born from 1945 through 1965 and any individual with known risks for hepatitis C.  Abdominal aortic aneurysm (AAA) screening./ Screening current or former smokers or have Hypertension.  Skin self-exam. / Monthly.  Influenza vaccine. / Every year.  Tetanus, diphtheria, and acellular pertussis (Tdap/Td) vaccine.** / 1 dose of Td every 10 years.   Zoster vaccine.** / 1 dose for  adults aged 60 years or older.         Pneumococcal 13-valent conjugate (PCV13) vaccine.    Pneumococcal polysaccharide (PPSV23) vaccine.     Hepatitis A vaccine.** / Consult your health care provider.  Hepatitis B vaccine.** / Consult your health care provider. Screening for abdominal aortic aneurysm (AAA)  by ultrasound is recommended for people who have history of high blood pressure or who are current or former smokers. 

## 2014-10-20 NOTE — Progress Notes (Addendum)
Patient ID: Daniel Burnett, male   DOB: 1946-09-01, 69 y.o.   MRN: [id number] Annual Comprehensive Examination  This very nice 69 y.o.MWM presents for complete physical.  Patient has been followed for HTN, T2_NIDDM w/CKD, Hyperlipidemia, and Vitamin D Deficiency.   HTN predates since 1995. Patient's BP has been controlled at home.Today's BP: 132/74 mmHg. Patient denies any cardiac symptoms as chest pain, palpitations, shortness of breath, dizziness or ankle swelling.   Patient's hyperlipidemia is controlled with diet and medications. Patient denies myalgias or other medication SE's. Last lipids were at goal - Total  Chol 169; HDL  46; LDL (calc) 97; Trigl 132 on 07/20/2014.   Patient has T2_NIDDM w/CKD2  since 2002  and patient denies reactive hypoglycemic symptoms, visual blurring, diabetic polys or paresthesias. FBG's usu 70-80's and occas ~60.  Last A1c was 6.1% on 07/20/2014. Exercises by walking - weather permitting.   Finally, patient has history of Vitamin D Deficiency of 19 in 2008 and last vitamin D was 73 on  07/20/2014.  Medication Sig  . aspirin 81 MG tablet Take 81 mg by mouth daily.  Marland Kitchen atenolol (TENORMIN) 100 MG tablet Take 1 tablet (100 mg total) by mouth daily.  . benazepril (LOTENSIN) 40 MG tablet TAKE 1 TABLET BY MOUTH EVERY DAY FOR BLOOD PRESSURE  . cholecalciferol (VITAMIN D) 1000 UNITS tablet Take 8,000 Units by mouth daily.  Marland Kitchen glipiZIDE (GLUCOTROL) 5 MG tablet TAKE 1/2  TABTWICE DAILY WITH MEALS  . glucose blood test strip Check glucose 1 time daily.  . magnesium gluconate  500 MG tablet Take 500 mg by mouth daily.  . metFORMIN -XR 500 MG 24 hr tablet TAKE 1 (-2 )TABLETS TWICE A DAY FOR DIABETES  . Omega-3 Fatty Acids (FISH OIL PO) Take 12,002 mg by mouth 2 (two) times daily.   . Red Yeast Rice 600 MG CAPS Take 600 mg by mouth. Takes 4 per day   Allergies  Allergen Reactions  . Aspirin   . Ppd [Tuberculin Purified Protein Derivative] Other (See Comments)    Positive  tb test.  . Tricor [Fenofibrate] Other (See Comments)    GI upset   Past Medical History  Diagnosis Date  . Hyperlipidemia   . Hypertension   . Diabetes mellitus without complication   . Vitamin D deficiency   . GERD (gastroesophageal reflux disease)   . ED (erectile dysfunction)   . Colitis   . BPH (benign prostatic hyperplasia)   . Kidney stone    Health Maintenance  Topic Date Due  . OPHTHALMOLOGY EXAM  08/06/1954  . TETANUS/TDAP  08/07/1963  . ZOSTAVAX  08/06/2004  . INFLUENZA VACCINE  05/08/2014  . URINE MICROALBUMIN  09/18/2014  . HEMOGLOBIN A1C  01/19/2015  . FOOT EXAM  07/21/2015  . COLONOSCOPY  06/08/2017  . PNEUMOCOCCAL POLYSACCHARIDE VACCINE AGE 21 AND OVER  Completed   Immunization History  Administered Date(s) Administered  . DT 02/09/2014  . DTaP 01/06/2005  . Influenza Whole 09/18/2012  . Pneumococcal Polysaccharide-23 09/18/2012   Past Surgical History  Procedure Laterality Date  . Cystoscopy w/ retrogrades  1991    stone extractions  . Flexible sigmoidoscopy  1993    negative  . Squamous cell carcinoma excision  2001    bridge of nose   Family History  Problem Relation Age of Onset  . Heart disease Mother   . Heart attack Mother   . Heart disease Father   . Heart attack Father   . Diabetes Maternal  Aunt   . Heart disease Brother   . Stroke Brother   . Hypertension Brother   . Cancer Cousin   . Leukemia Cousin     History   Social History  . Marital Status: Married    Spouse Name: N/A    Number of Children: N/A  . Years of Education: N/A   Occupational History  . 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 Smoking status: Former Smoker    Quit date: 10/08/1962  . Smokeless tobacco: Not on file  . Alcohol Use: No  . Drug Use: No  . Sexual Activity: Not on file    ROS Constitutional: Denies fever, chills, weight loss/gain, headaches, insomnia, fatigue, night sweats or change in appetite. Eyes: Denies  redness, blurred vision, diplopia, discharge, itchy or watery eyes.  ENT: Denies discharge, congestion, post nasal drip, epistaxis, sore throat, earache, hearing loss, dental pain, Tinnitus, Vertigo, Sinus pain or snoring.  Cardio: Denies chest pain, palpitations, irregular heartbeat, syncope, dyspnea, diaphoresis, orthopnea, PND, claudication or edema Respiratory: denies cough, dyspnea, DOE, pleurisy, hoarseness, laryngitis or wheezing.  Gastrointestinal: Denies dysphagia, heartburn, reflux, water brash, pain, cramps, nausea, vomiting, bloating, diarrhea, constipation, hematemesis, melena, hematochezia, jaundice or hemorrhoids Genitourinary: Denies dysuria, frequency, urgency, nocturia, hesitancy, discharge, hematuria or flank pain Musculoskeletal: Denies arthralgia, myalgia, stiffness, Jt. Swelling, pain, limp or strain/sprain. Denies Falls. Skin: Denies puritis, rash, hives, warts, acne, eczema or change in skin lesion Neuro: No weakness, tremor, incoordination, spasms, paresthesia or pain Psychiatric: Denies confusion, memory loss or sensory loss. Denies Depression. Endocrine: Denies change in weight, skin, hair change, nocturia, and paresthesia, diabetic polys, visual blurring or hyper / hypo glycemic episodes.  Heme/Lymph: No excessive bleeding, bruising or enlarged lymph nodes.  Physical Exam  BP 132/74   Pulse 48  Temp 98.2 F   Resp 16  Ht 5' 9.25"  Wt 196 lb 3.2 oz    BMI 28.76   General Appearance: Well nourished, in no apparent distress. Eyes: PERRLA, EOMs, conjunctiva no swelling or erythema, normal fundi and vessels. Sinuses: No frontal/maxillary tenderness ENT/Mouth: EACs patent / TMs  nl. Nares clear without erythema, swelling, mucoid exudates. Oral hygiene is good. No erythema, swelling, or exudate. Tongue normal, non-obstructing. Tonsils not swollen or erythematous. Hearing normal.  Neck: Supple, thyroid normal. No bruits, nodes or JVD. Respiratory: Respiratory effort  normal.  BS equal and clear bilateral without rales, rhonci, wheezing or stridor. Cardio: Heart sounds are normal with regular rate and rhythm and no murmurs, rubs or gallops. Peripheral pulses are normal and equal bilaterally without edema. No aortic or femoral bruits. Chest: symmetric with normal excursions and percussion.  Abdomen: Flat, soft, with bowl sounds. Nontender, no guarding, rebound, hernias, masses, or organomegaly.  Lymphatics: Non tender without lymphadenopathy.  Genitourinary: No hernias.Testes nl. DRE - prostate nl for age - smooth & firm w/o nodules. Musculoskeletal: Full ROM all peripheral extremities, joint stability, 5/5 strength, and normal gait. Skin: Warm and dry without rashes, lesions, cyanosis, clubbing or  ecchymosis.  Neuro: Cranial nerves intact, reflexes equal bilaterally. Normal muscle tone, no cerebellar symptoms. Sensation intact by monofilament testing to the toes bilaterally.   Pysch: Awake and oriented X 3 with normal affect, insight and judgment appropriate.   Assessment and Plan  1. Essential hypertension  - EKG 12-Lead - [place], RETROPERITNL ABD,  LTD - TSH  2. Hyperlipidemia  - Lipid panel  3. T2_NIDDM w/ CKD  - Microalbumin / creatinine urine ratio -  HM DIABETES FOOT EXAM - LOW EXTREMITY NEUR EXAM DOCUM - Hemoglobin A1c - Insulin, fasting - advised hold the Glipizide 1/2 tab bid unless CBG's>180 mg% to avoid hypoglycemia.  4. Vitamin D deficiency  - Vit D  25 hydroxy (rtn osteoporosis monitoring)  5. Screening for rectal cancer  - POC Hemoccult Bld/Stl (3-Cd Home Screen); Future  6. Prostate cancer screening  - PSA  7. Depression screen  - Negative   8. At low risk for fall   9. Encounter for long-term (current) use of medications  - Urine Microscopic - CBC with Differential - BASIC METABOLIC PANEL WITH GFR - Hepatic function panel - Magnesium   Continue prudent diet as discussed, weight control, BP monitoring, regular  exercise, and medications as discussed.  Discussed med effects and SE's. Routine screening labs and tests as requested with regular follow-up as recommended.

## 2014-10-21 ENCOUNTER — Other Ambulatory Visit: Payer: Self-pay | Admitting: Internal Medicine

## 2014-10-21 DIAGNOSIS — R972 Elevated prostate specific antigen [PSA]: Secondary | ICD-10-CM | POA: Insufficient documentation

## 2014-10-21 LAB — BASIC METABOLIC PANEL WITH GFR
BUN: 20 mg/dL (ref 6–23)
CO2: 30 meq/L (ref 19–32)
CREATININE: 1.2 mg/dL (ref 0.50–1.35)
Calcium: 9.8 mg/dL (ref 8.4–10.5)
Chloride: 101 mEq/L (ref 96–112)
GFR, Est African American: 70 mL/min
GFR, Est Non African American: 61 mL/min
GLUCOSE: 84 mg/dL (ref 70–99)
Potassium: 4.8 mEq/L (ref 3.5–5.3)
SODIUM: 139 meq/L (ref 135–145)

## 2014-10-21 LAB — URINALYSIS, MICROSCOPIC ONLY
Bacteria, UA: NONE SEEN
Casts: NONE SEEN
Crystals: NONE SEEN
Squamous Epithelial / LPF: NONE SEEN

## 2014-10-21 LAB — CBC WITH DIFFERENTIAL/PLATELET
Basophils Absolute: 0.1 10*3/uL (ref 0.0–0.1)
Basophils Relative: 1 % (ref 0–1)
Eosinophils Absolute: 1.1 10*3/uL — ABNORMAL HIGH (ref 0.0–0.7)
Eosinophils Relative: 8 % — ABNORMAL HIGH (ref 0–5)
HEMATOCRIT: 42 % (ref 39.0–52.0)
Hemoglobin: 14.4 g/dL (ref 13.0–17.0)
LYMPHS ABS: 3.1 10*3/uL (ref 0.7–4.0)
Lymphocytes Relative: 23 % (ref 12–46)
MCH: 29.3 pg (ref 26.0–34.0)
MCHC: 34.3 g/dL (ref 30.0–36.0)
MCV: 85.5 fL (ref 78.0–100.0)
MONO ABS: 1.1 10*3/uL — AB (ref 0.1–1.0)
MONOS PCT: 8 % (ref 3–12)
MPV: 9.8 fL (ref 8.6–12.4)
NEUTROS PCT: 60 % (ref 43–77)
Neutro Abs: 8 10*3/uL — ABNORMAL HIGH (ref 1.7–7.7)
Platelets: 266 10*3/uL (ref 150–400)
RBC: 4.91 MIL/uL (ref 4.22–5.81)
RDW: 14.3 % (ref 11.5–15.5)
WBC: 13.4 10*3/uL — ABNORMAL HIGH (ref 4.0–10.5)

## 2014-10-21 LAB — TSH: TSH: 2.323 u[IU]/mL (ref 0.350–4.500)

## 2014-10-21 LAB — LIPID PANEL
CHOLESTEROL: 154 mg/dL (ref 0–200)
HDL: 42 mg/dL (ref >39)
LDL Cholesterol: 84 mg/dL (ref 0–99)
Total CHOL/HDL Ratio: 3.7 Ratio
Triglycerides: 142 mg/dL (ref <150)
VLDL: 28 mg/dL (ref 0–40)

## 2014-10-21 LAB — PSA: PSA: 3.91 ng/mL (ref <=4.00)

## 2014-10-21 LAB — HEPATIC FUNCTION PANEL
ALK PHOS: 66 U/L (ref 39–117)
ALT: 13 U/L (ref 0–53)
AST: 13 U/L (ref 0–37)
Albumin: 4.1 g/dL (ref 3.5–5.2)
BILIRUBIN INDIRECT: 0.5 mg/dL (ref 0.2–1.2)
BILIRUBIN TOTAL: 0.6 mg/dL (ref 0.2–1.2)
Bilirubin, Direct: 0.1 mg/dL (ref 0.0–0.3)
Total Protein: 6.9 g/dL (ref 6.0–8.3)

## 2014-10-21 LAB — MICROALBUMIN / CREATININE URINE RATIO
Creatinine, Urine: 199.2 mg/dL
Microalb Creat Ratio: 5.5 mg/g (ref 0.0–30.0)
Microalb, Ur: 1.1 mg/dL (ref <2.0)

## 2014-10-21 LAB — VITAMIN D 25 HYDROXY (VIT D DEFICIENCY, FRACTURES): Vit D, 25-Hydroxy: 51 ng/mL (ref 30–100)

## 2014-10-21 LAB — MAGNESIUM: Magnesium: 1.8 mg/dL (ref 1.5–2.5)

## 2014-10-21 LAB — INSULIN, FASTING: INSULIN FASTING, SERUM: 9.4 u[IU]/mL (ref 2.0–19.6)

## 2014-12-29 ENCOUNTER — Other Ambulatory Visit: Payer: Self-pay | Admitting: Internal Medicine

## 2015-02-08 ENCOUNTER — Ambulatory Visit: Payer: Self-pay | Admitting: Physician Assistant

## 2015-02-11 ENCOUNTER — Encounter: Payer: Self-pay | Admitting: Internal Medicine

## 2015-02-11 ENCOUNTER — Ambulatory Visit (INDEPENDENT_AMBULATORY_CARE_PROVIDER_SITE_OTHER): Payer: PPO | Admitting: Internal Medicine

## 2015-02-11 ENCOUNTER — Ambulatory Visit: Payer: Self-pay | Admitting: Physician Assistant

## 2015-02-11 VITALS — BP 128/82 | HR 52 | Temp 98.0°F | Resp 16 | Ht 69.25 in | Wt 193.0 lb

## 2015-02-11 DIAGNOSIS — I10 Essential (primary) hypertension: Secondary | ICD-10-CM

## 2015-02-11 DIAGNOSIS — E1121 Type 2 diabetes mellitus with diabetic nephropathy: Secondary | ICD-10-CM

## 2015-02-11 DIAGNOSIS — Z79899 Other long term (current) drug therapy: Secondary | ICD-10-CM

## 2015-02-11 DIAGNOSIS — E559 Vitamin D deficiency, unspecified: Secondary | ICD-10-CM

## 2015-02-11 DIAGNOSIS — E782 Mixed hyperlipidemia: Secondary | ICD-10-CM

## 2015-02-11 LAB — HEPATIC FUNCTION PANEL
ALT: 17 U/L (ref 0–53)
AST: 15 U/L (ref 0–37)
Albumin: 4.5 g/dL (ref 3.5–5.2)
Alkaline Phosphatase: 55 U/L (ref 39–117)
BILIRUBIN DIRECT: 0.2 mg/dL (ref 0.0–0.3)
BILIRUBIN TOTAL: 0.8 mg/dL (ref 0.2–1.2)
Indirect Bilirubin: 0.6 mg/dL (ref 0.2–1.2)
Total Protein: 7.1 g/dL (ref 6.0–8.3)

## 2015-02-11 LAB — CBC WITH DIFFERENTIAL/PLATELET
BASOS ABS: 0.1 10*3/uL (ref 0.0–0.1)
BASOS PCT: 1 % (ref 0–1)
EOS PCT: 7 % — AB (ref 0–5)
Eosinophils Absolute: 0.8 10*3/uL — ABNORMAL HIGH (ref 0.0–0.7)
HCT: 43.5 % (ref 39.0–52.0)
Hemoglobin: 15.1 g/dL (ref 13.0–17.0)
Lymphocytes Relative: 27 % (ref 12–46)
Lymphs Abs: 2.9 10*3/uL (ref 0.7–4.0)
MCH: 29.9 pg (ref 26.0–34.0)
MCHC: 34.7 g/dL (ref 30.0–36.0)
MCV: 86.1 fL (ref 78.0–100.0)
MONO ABS: 0.8 10*3/uL (ref 0.1–1.0)
MPV: 9.5 fL (ref 8.6–12.4)
Monocytes Relative: 7 % (ref 3–12)
Neutro Abs: 6.3 10*3/uL (ref 1.7–7.7)
Neutrophils Relative %: 58 % (ref 43–77)
PLATELETS: 282 10*3/uL (ref 150–400)
RBC: 5.05 MIL/uL (ref 4.22–5.81)
RDW: 14.3 % (ref 11.5–15.5)
WBC: 10.8 10*3/uL — ABNORMAL HIGH (ref 4.0–10.5)

## 2015-02-11 LAB — BASIC METABOLIC PANEL WITH GFR
BUN: 20 mg/dL (ref 6–23)
CALCIUM: 9.8 mg/dL (ref 8.4–10.5)
CHLORIDE: 97 meq/L (ref 96–112)
CO2: 29 mEq/L (ref 19–32)
CREATININE: 1.21 mg/dL (ref 0.50–1.35)
GFR, Est African American: 70 mL/min
GFR, Est Non African American: 60 mL/min
Glucose, Bld: 97 mg/dL (ref 70–99)
Potassium: 5.1 mEq/L (ref 3.5–5.3)
Sodium: 136 mEq/L (ref 135–145)

## 2015-02-11 LAB — LIPID PANEL
CHOL/HDL RATIO: 3.6 ratio
Cholesterol: 183 mg/dL (ref 0–200)
HDL: 51 mg/dL (ref >=40)
LDL Cholesterol: 104 mg/dL — ABNORMAL HIGH (ref 0–99)
Triglycerides: 140 mg/dL (ref <150)
VLDL: 28 mg/dL (ref 0–40)

## 2015-02-11 LAB — HEMOGLOBIN A1C
HEMOGLOBIN A1C: 6.3 % — AB (ref <5.7)
Mean Plasma Glucose: 134 mg/dL — ABNORMAL HIGH (ref <117)

## 2015-02-11 LAB — MAGNESIUM: MAGNESIUM: 2.1 mg/dL (ref 1.5–2.5)

## 2015-02-11 NOTE — Patient Instructions (Signed)
Flatulence There are good germs in your gut to help you digest food. Gas is produced by these germs and released from your bottom. Most people release 3 to 4 quarts of gas every day. This is normal. HOME CARE  Eat or drink less of the foods or liquids that give you gas.  Take the time to chew your food well. Talk less while you eat.  Do not suck on ice or hard candy.  Sip slowly. Stir some of the bubbles out of fizzy drinks with a spoon or straw.  Avoid chewing gum or smoking.  Ask your doctor about liquids and tablets that may help control burping and gas.  Only take medicine as told by your doctor. GET HELP RIGHT AWAY IF:   There is discomfort when you burp or pass gas.  You throw up (vomit) when you burp.  Poop (stool) comes out when you pass gas.  Your belly is puffy (swollen) and hard. MAKE SURE YOU:   Understand these instructions.  Will watch your condition.  Will get help right away if you are not doing well or get worse. Document Released: 07/27/2008 Document Revised: 12/17/2011 Document Reviewed: 07/27/2008 [facility] Patient Information 2015 [place], [place]. This information is not intended to replace advice given to you by your health care provider. Make sure you discuss any questions you have with your health care provider.

## 2015-02-11 NOTE — Progress Notes (Signed)
Patient ID: Daniel Burnett, male   DOB: [birth year], 69 y.o.   MRN: [id number]  Assessment and Plan:  Hypertension:  -Continue medication, consider d/c atenolol and only taking if BP is 140/85 or greater -monitor blood pressure at home. -Continue DASH diet -Reminder to go to the ER if any CP, SOB, nausea, dizziness, severe HA, changes vision/speech, left arm numbness and tingling and jaw pain.  Cholesterol - Continue diet and exercise -Check cholesterol.   Diabetes with diabetic chronic kidney disease -Continue diet and exercise.  -Check A1C  Vitamin D Def -check level -continue medications.   Continue diet and meds as discussed. Further disposition pending results of labs. Discussed med's effects and SE's.    HPI 69 y.o. male  presents for 3 month follow up with hypertension, hyperlipidemia, diabetes and vitamin D deficiency.   His blood pressure has been controlled at home, today their BP is BP: 128/82 mmHg.He does workout.  He is gardening and walking around his [place] acre property.  He denies chest pain, shortness of breath, dizziness.   He is on cholesterol medication and denies myalgias. His cholesterol is at goal. The cholesterol was:  10/20/2014: Cholesterol, Total 154; HDL-C 42; LDL (calc) 84; Triglycerides 142   He has been working on diet and exercise for diabetes with diabetic chronic kidney disease, he is on bASA, he is on ACE/ARB, and denies  foot ulcerations, hyperglycemia, hypoglycemia , increased appetite, nausea, paresthesia of the feet, polydipsia, polyuria, visual disturbances, vomiting and weight loss. Last A1C was: 10/20/2014: Hemoglobin-A1c 6.3*   Patient is on Vitamin D supplement. 10/20/2014: VITD 51  He has been doing well.  He has no concerns.  He would like to come off of some of the medications.    Current Medications:  Current Outpatient Prescriptions on File Prior to Visit  Medication Sig Dispense Refill  . aspirin 81 MG tablet Take 81 mg by mouth  daily.    Marland Kitchen atenolol (TENORMIN) 100 MG tablet Take 1 tablet (100 mg total) by mouth daily. 90 tablet 3  . benazepril (LOTENSIN) 40 MG tablet TAKE 1 TABLET BY MOUTH EVERY DAY FOR BLOOD PRESSURE 90 tablet 0  . Cholecalciferol (VITAMIN D) 2000 UNITS CAPS Take 4,000 Units by mouth daily.    Marland Kitchen glipiZIDE (GLUCOTROL) 5 MG tablet TAKE 1/2 TO 1 TABLET BY MOUTH TWICE DAILY WITH MEALS 180 tablet 2  . glucose blood test strip Check glucose 1 time daily. 100 each 12  . magnesium gluconate (MAGONATE) 500 MG tablet Take 500 mg by mouth daily.    . metFORMIN (GLUCOPHAGE-XR) 500 MG 24 hr tablet TAKE 1-2 TABLETS TWICE A DAY FOR DIABETES 360 tablet 0  . Omega-3 Fatty Acids (FISH OIL PO) Take 12,002 mg by mouth 2 (two) times daily.     . Red Yeast Rice 600 MG CAPS Take 600 mg by mouth. Takes 4 per day     No current facility-administered medications on file prior to visit.   Medical History:  Past Medical History  Diagnosis Date  . Hyperlipidemia   . Hypertension   . Diabetes mellitus without complication   . Vitamin D deficiency   . GERD (gastroesophageal reflux disease)   . ED (erectile dysfunction)   . Colitis   . BPH (benign prostatic hyperplasia)   . Kidney stone    Allergies:  Allergies  Allergen Reactions  . Aspirin   . Ppd [Tuberculin Purified Protein Derivative] Other (See Comments)    Positive tb test.  .  Tricor [Fenofibrate] Other (See Comments)    GI upset     Review of Systems:  Review of Systems  Constitutional: Negative for fever, chills and malaise/fatigue.  HENT: Negative for congestion, sore throat and tinnitus.   Eyes: Negative.   Respiratory: Negative for cough, shortness of breath and wheezing.   Cardiovascular: Negative for chest pain, palpitations and leg swelling.  Gastrointestinal: Negative for heartburn, nausea, vomiting, diarrhea, constipation, blood in stool and melena.  Genitourinary: Negative for dysuria, urgency and frequency.  Skin: Negative.   Neurological:  Negative for dizziness, sensory change, loss of consciousness and headaches.  Psychiatric/Behavioral: Negative for depression. The patient is not nervous/anxious and does not have insomnia.     Family history- Review and unchanged  Social history- Review and unchanged  Physical Exam: BP 128/82 mmHg  Pulse 52  Temp(Src) 98 F (36.7 C) (Temporal)  Resp 16  Ht 5' 9.25" (1.759 m)  Wt 193 lb (87.544 kg)  BMI 28.29 kg/m2 Wt Readings from Last 3 Encounters:  02/11/15 193 lb (87.544 kg)  10/20/14 196 lb 3.2 oz (88.996 kg)  07/20/14 194 lb (87.998 kg)   General Appearance: Well nourished well developed, non-toxic appearing, in no apparent distress. Eyes: PERRLA, EOMs, conjunctiva no swelling or erythema ENT/Mouth: Bilateral hearing aids in place.  Ear canals clear with no erythema, swelling, or discharge.  TMs normal bilaterally, oropharynx clear, moist, with no exudate.   Neck: Supple, thyroid normal, no JVD, no cervical adenopathy.  Respiratory: Respiratory effort normal, breath sounds clear A&P, no wheeze, rhonchi or rales noted.  No retractions, no accessory muscle usage Cardio: RRR with no MRGs. No noted edema.  Abdomen: Soft, + BS.  Non tender, no guarding, rebound, hernias, masses. Musculoskeletal: Full ROM, 5/5 strength, Normal gait Skin: Warm, dry without rashes, lesions, ecchymosis.  Neuro: Awake and oriented X 3, Cranial nerves intact. No cerebellar symptoms.  Psych: normal affect, Insight and Judgment appropriate.    FORCUCCI, Zeplin Aleshire, PA-C 8:55 AM [facility]

## 2015-02-12 LAB — INSULIN, RANDOM: Insulin: 5.8 u[IU]/mL (ref 2.0–19.6)

## 2015-02-14 LAB — VITAMIN D 1,25 DIHYDROXY
Vitamin D 1, 25 (OH)2 Total: 48 pg/mL (ref 18–72)
Vitamin D2 1, 25 (OH)2: <8 pg/mL
Vitamin D3 1, 25 (OH)2: 48 pg/mL

## 2015-05-13 ENCOUNTER — Ambulatory Visit: Payer: Self-pay | Admitting: Internal Medicine

## 2015-05-20 ENCOUNTER — Ambulatory Visit (INDEPENDENT_AMBULATORY_CARE_PROVIDER_SITE_OTHER): Payer: PPO | Admitting: Internal Medicine

## 2015-05-20 ENCOUNTER — Encounter: Payer: Self-pay | Admitting: Internal Medicine

## 2015-05-20 VITALS — BP 104/66 | HR 56 | Temp 97.5°F | Resp 16 | Ht 68.5 in | Wt 189.4 lb

## 2015-05-20 DIAGNOSIS — I10 Essential (primary) hypertension: Secondary | ICD-10-CM

## 2015-05-20 DIAGNOSIS — E559 Vitamin D deficiency, unspecified: Secondary | ICD-10-CM

## 2015-05-20 DIAGNOSIS — R6889 Other general symptoms and signs: Secondary | ICD-10-CM

## 2015-05-20 DIAGNOSIS — Z1331 Encounter for screening for depression: Secondary | ICD-10-CM

## 2015-05-20 DIAGNOSIS — R972 Elevated prostate specific antigen [PSA]: Secondary | ICD-10-CM

## 2015-05-20 DIAGNOSIS — E1121 Type 2 diabetes mellitus with diabetic nephropathy: Secondary | ICD-10-CM | POA: Diagnosis not present

## 2015-05-20 DIAGNOSIS — Z0001 Encounter for general adult medical examination with abnormal findings: Secondary | ICD-10-CM

## 2015-05-20 DIAGNOSIS — Z79899 Other long term (current) drug therapy: Secondary | ICD-10-CM

## 2015-05-20 DIAGNOSIS — E782 Mixed hyperlipidemia: Secondary | ICD-10-CM

## 2015-05-20 DIAGNOSIS — Z6828 Body mass index (BMI) 28.0-28.9, adult: Secondary | ICD-10-CM

## 2015-05-20 DIAGNOSIS — Z9181 History of falling: Secondary | ICD-10-CM

## 2015-05-20 DIAGNOSIS — Z0000 Encounter for general adult medical examination without abnormal findings: Secondary | ICD-10-CM | POA: Insufficient documentation

## 2015-05-20 LAB — CBC WITH DIFFERENTIAL/PLATELET
BASOS ABS: 0.1 10*3/uL (ref 0.0–0.1)
Basophils Relative: 1 % (ref 0–1)
Eosinophils Absolute: 0.7 10*3/uL (ref 0.0–0.7)
Eosinophils Relative: 7 % — ABNORMAL HIGH (ref 0–5)
HEMATOCRIT: 42.3 % (ref 39.0–52.0)
Hemoglobin: 14.2 g/dL (ref 13.0–17.0)
LYMPHS PCT: 30 % (ref 12–46)
Lymphs Abs: 2.9 10*3/uL (ref 0.7–4.0)
MCH: 29.8 pg (ref 26.0–34.0)
MCHC: 33.6 g/dL (ref 30.0–36.0)
MCV: 88.7 fL (ref 78.0–100.0)
MONO ABS: 0.7 10*3/uL (ref 0.1–1.0)
MONOS PCT: 7 % (ref 3–12)
MPV: 9.5 fL (ref 8.6–12.4)
NEUTROS ABS: 5.2 10*3/uL (ref 1.7–7.7)
Neutrophils Relative %: 55 % (ref 43–77)
Platelets: 280 10*3/uL (ref 150–400)
RBC: 4.77 MIL/uL (ref 4.22–5.81)
RDW: 14.2 % (ref 11.5–15.5)
WBC: 9.5 10*3/uL (ref 4.0–10.5)

## 2015-05-20 LAB — LIPID PANEL
CHOLESTEROL: 169 mg/dL (ref 125–200)
HDL: 45 mg/dL (ref >=40)
LDL Cholesterol: 103 mg/dL (ref <130)
TRIGLYCERIDES: 106 mg/dL (ref <150)
Total CHOL/HDL Ratio: 3.8 Ratio (ref <=5.0)
VLDL: 21 mg/dL (ref <30)

## 2015-05-20 LAB — MAGNESIUM: MAGNESIUM: 1.8 mg/dL (ref 1.5–2.5)

## 2015-05-20 LAB — BASIC METABOLIC PANEL WITH GFR
BUN: 25 mg/dL (ref 7–25)
CO2: 28 mmol/L (ref 20–31)
Calcium: 9.7 mg/dL (ref 8.6–10.3)
Chloride: 100 mmol/L (ref 98–110)
Creat: 1.27 mg/dL — ABNORMAL HIGH (ref 0.70–1.18)
GFR, Est African American: 66 mL/min (ref >=60)
GFR, Est Non African American: 57 mL/min — ABNORMAL LOW (ref >=60)
Glucose, Bld: 75 mg/dL (ref 65–99)
Potassium: 4.8 mmol/L (ref 3.5–5.3)
Sodium: 142 mmol/L (ref 135–146)

## 2015-05-20 LAB — HEPATIC FUNCTION PANEL
ALK PHOS: 51 U/L (ref 40–115)
ALT: 16 U/L (ref 9–46)
AST: 15 U/L (ref 10–35)
Albumin: 4.4 g/dL (ref 3.6–5.1)
BILIRUBIN DIRECT: 0.1 mg/dL (ref <=0.2)
BILIRUBIN TOTAL: 0.8 mg/dL (ref 0.2–1.2)
Indirect Bilirubin: 0.7 mg/dL (ref 0.2–1.2)
Total Protein: 6.6 g/dL (ref 6.1–8.1)

## 2015-05-20 LAB — TSH: TSH: 1.886 u[IU]/mL (ref 0.350–4.500)

## 2015-05-20 NOTE — Progress Notes (Signed)
Patient ID: Daniel Burnett, male   DOB: [birth year], 69 y.o.   MRN: [id number]  MEDICARE ANNUAL WELLNESS VISIT ANDOV  Assessment:   1. Essential hypertension  - TSH  2. Hyperlipidemia  - Lipid panel  3. T2_NIDDM w/ CKD  - Hemoglobin A1c - Insulin, random  4. Vitamin D deficiency  - Vit D  25 hydroxy   5. Elevated PSA   6. At low risk for fall   7. Depression screen   8. Encounter for long-term (current) use of medications  - CBC with Differential/Platelet - BASIC METABOLIC PANEL WITH GFR - Hepatic function panel - Magnesium  9. BMI 28.0-28.9,adult  Plan:   During the course of the visit the patient was educated and counseled about appropriate screening and preventive services including:    Pneumococcal vaccine   Influenza vaccine  Td vaccine  Screening electrocardiogram  Bone densitometry screening  Colorectal cancer screening  Diabetes screening  Glaucoma screening  Nutrition counseling   Advanced directives: requested  Screening recommendations, referrals: Vaccinations: Immunization History  Administered Date(s) Administered  . DT 02/09/2014  . DTaP 01/06/2005  . Influenza Whole 09/18/2012  . Pneumococcal Polysaccharide-23 09/18/2012  Influenza - Declines now Prevnar vaccine out of stock Shingles vaccine declined Hep B vaccine not indicated  Nutrition assessed and recommended  Colonoscopy 2013 Recommended yearly ophthalmology/optometry visit for glaucoma screening and checkup Recommended yearly dental visit for hygiene and checkup Advanced directives - declined  Conditions/risks identified: BMI: Discussed weight loss, diet, and increase physical activity.  Increase physical activity: AHA recommends 150 minutes of physical activity a week.  Medications reviewed Diabetes is not at goal, ACE/ARB therapy: Yes. Urinary Incontinence is not an issue: discussed non pharmacology and pharmacology options.  Fall risk: low- discussed PT,  home fall assessment, medications.   Subjective:    Daniel Burnett  presents for [employer] Visit and OV.  Date of last medicare wellness visit was 10/13 2015.  This very nice 69 y.o. MWM presents also for 3 month follow up with Hypertension, Hyperlipidemia, T2_DM/CKD  and Vitamin D Deficiency.    Patient is treated for HTN since 1995  & BP has been controlled at home. Today's BP: 104/66 mmHg. Patient has had no complaints of any cardiac type chest pain, palpitations, dyspnea/orthopnea/PND, dizziness, claudication, or dependent edema.   Hyperlipidemia is controlled with diet & meds. Patient denies myalgias or other med SE's. Last Lipids were near goal - Cholesterol 183; HDL 51; LDL 104; Triglycerides 140 on 02/11/2015.    Also, the patient has history of T2_NIDDM since 2002 and has CKD 2/3 (GFR 60 ml/min)  and he's had no symptoms of reactive hypoglycemia, diabetic polys, paresthesias or visual blurring.  Reports CBG's range 90-110 mg%. Last A1c was not at goal with A1c 6.3% on 02/11/2015.   Further, the patient also has history of Vitamin D Deficiency of "19" in 2008 and supplements vitamin D without any suspected side-effects. Last vitamin D was 51 on 10/20/2014.   Names of Other Physician/Practitioners you currently use: 1.  [facility] here for primary care 2. Dr Delight Stare , eye doctor, last visit July 2016 3. 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 aspirin 81 MG tablet Take 81 mg by mouth daily.  Marland Kitchen atenolol (TENORMIN) 100 MG  tablet Take 1 tablet (100 mg total) by mouth daily.  . benazepril (LOTENSIN) 40 MG tablet TAKE 1 TABLET BY MOUTH EVERY DAY FOR BLOOD PRESSURE  . Cholecalciferol (VITAMIN D) 2000 UNITS CAPS  Take 4,000 Units by mouth daily.  Marland Kitchen glipiZIDE (GLUCOTROL) 5 MG tablet TAKE 1/2 TO 1 TABLET BY MOUTH TWICE DAILY WITH MEALS  . glucose blood test strip Check glucose 1 time daily.  . magnesium gluconate (MAGONATE) 500 MG tablet Take 500 mg by mouth daily.  . metFORMIN (GLUCOPHAGE-XR) 500 MG 24 hr tablet TAKE 1-2 TABLETS TWICE A DAY FOR DIABETES  . Omega-3 Fatty Acids (FISH OIL PO) Take 12,002 mg by mouth 2 (two) times daily.   . Red Yeast Rice 600 MG CAPS Take 600 mg by mouth. Takes 4 per day   Allergies  Allergen Reactions  . Aspirin   . Ppd [Tuberculin Purified Protein Derivative] Other (See Comments)    Positive tb test.  . Tricor [Fenofibrate] Other (See Comments)    GI upset   Current Problems (verified) Patient Active Problem List   Diagnosis Date Noted  . Elevated PSA 10/21/2014  . T2_NIDDM w/ CKD 10/20/2014  . Encounter for long-term (current) use of medications 11/06/2013  . Essential hypertension 09/18/2013  . Hyperlipidemia 09/18/2013  . Vitamin D deficiency 09/18/2013    Screening Tests Health Maintenance  Topic Date Due  . Hepatitis C Screening  12/25/1945  . OPHTHALMOLOGY EXAM  08/06/1954  . TETANUS/TDAP  08/07/1963  . ZOSTAVAX  08/06/2004  . PNA vac Low Risk Adult (2 of 2 - PCV13) 09/18/2013  . INFLUENZA VACCINE  05/09/2015  . HEMOGLOBIN A1C  08/14/2015  . FOOT EXAM  10/21/2015  . URINE MICROALBUMIN  10/21/2015  . COLONOSCOPY  06/08/2017    Immunization History  Administered Date(s) Administered  . DT 02/09/2014  . DTaP 01/06/2005  . Influenza Whole 09/18/2012  . Pneumococcal Polysaccharide-23 09/18/2012    Preventative care: Last colonoscopy: 2013  Past Medical History  Diagnosis Date  . Hyperlipidemia   . Hypertension   . Diabetes mellitus without complication   . Vitamin D deficiency   . GERD (gastroesophageal reflux disease)   . ED (erectile dysfunction)   . Colitis   . BPH (benign prostatic hyperplasia)   . Kidney stone     Past  Surgical History  Procedure Laterality Date  . Cystoscopy w/ retrogrades  1991    stone extractions  . Flexible sigmoidoscopy  1993    negative  . Squamous cell carcinoma excision  2001    bridge of nose    Risk Factors: Tobacco Social History  Substance Use Topics  . Smoking status: Former Smoker    Quit date: 10/08/1962  . Smokeless tobacco: None  . Alcohol Use: No   He does not smoke.  Patient is a former smoker. Are there smokers in your home (other than you)?  No  Alcohol Current alcohol use: none  Caffeine Current caffeine use: denies use  Exercise Current exercise: gardening, walking and yard work  Nutrition/Diet Current diet: in general, a "healthy" diet    Cardiac risk factors: advanced age (older than 55 for men, 26 for women), diabetes mellitus, dyslipidemia, family history of premature cardiovascular disease, hypertension, male gender and sedentary lifestyle.  Depression Screen (Note: if answer to either of the following is "Yes", a more complete depression screening is indicated)   Q1: Over the past two weeks, have you felt down, depressed or hopeless? No  Q2: Over the past  two weeks, have you felt little interest or pleasure in doing things? No  Have you lost interest or pleasure in daily life? No  Do you often feel hopeless? No  Do you cry easily over simple problems? No  Activities of Daily Living In your present state of health, do you have any difficulty performing the following activities?:  Driving? No Managing money?  No Feeding yourself? No Getting from bed to chair? No Climbing a flight of stairs? No Preparing food and eating?: No Bathing or showering? No Getting dressed: No Getting to the toilet? No Using the toilet:No Moving around from place to place: No In the past year have you fallen or had a near fall?:No   Are you sexually active?  Yes  Do you have more than one partner?  No  Vision Difficulties: No  Hearing Difficulties:  No Do you often ask people to speak up or repeat themselves? No Do you experience ringing or noises in your ears? No Do you have difficulty understanding soft or whispered voices? No  Cognition  Do you feel that you have a problem with memory?No  Do you often misplace items? No  Do you feel safe at home?  Yes  Advanced directives Does patient have a [place]? No - declined forms Does patient have a Living Will? No - declined forms  ROS: Constitutional: Denies fever, chills, weight loss/gain, headaches, insomnia, fatigue, night sweats or change in appetite. Eyes: Denies redness, blurred vision, diplopia, discharge, itchy or watery eyes.  ENT: Denies discharge, congestion, post nasal drip, epistaxis, sore throat, earache, hearing loss, dental pain, Tinnitus, Vertigo, Sinus pain or snoring.  Cardio: Denies chest pain, palpitations, irregular heartbeat, syncope, dyspnea, diaphoresis, orthopnea, PND, claudication or edema Respiratory: denies cough, dyspnea, DOE, pleurisy, hoarseness, laryngitis or wheezing.  Gastrointestinal: Denies dysphagia, heartburn, reflux, water brash, pain, cramps, nausea, vomiting, bloating, diarrhea, constipation, hematemesis, melena, hematochezia, jaundice or hemorrhoids Genitourinary: Denies dysuria, frequency, urgency, nocturia, hesitancy, discharge, hematuria or flank pain Musculoskeletal: Denies arthralgia, myalgia, stiffness, Jt. Swelling, pain, limp or strain/sprain. Denies Falls. Skin: Denies puritis, rash, hives, warts, acne, eczema or change in skin lesion Neuro: No weakness, tremor, incoordination, spasms, paresthesia or pain Psychiatric: Denies confusion, memory loss or sensory loss. Denies Depression. Endocrine: Denies change in weight, skin, hair change, nocturia, and paresthesia, diabetic polys, visual blurring or hyper / hypo glycemic episodes.  Heme/Lymph: No excessive bleeding, bruising or enlarged lymph nodes.  Objective:      BP 104/66   Pulse 56  Temp 97.5 F   Resp 16  Ht 5' 8.5"   Wt 189 lb 6.4 oz     BMI 28.38   General Appearance:  Alert  WD/WN, male  in no apparent distress. Eyes: PERRLA, EOMs nl, conjunctiva normal, normal fundi and vessels. Sinuses: No frontal/maxillary tenderness ENT/Mouth: EACs patent / TMs  nl. Nares clear without erythema, swelling, mucoid exudates. Oral hygiene is good. No erythema, swelling, or exudate. Tongue normal, non-obstructing. Tonsils not swollen or erythematous. Hearing normal.  Neck: Supple, thyroid normal. No bruits, nodes or JVD. Respiratory: Respiratory effort normal.  BS equal and clear bilateral without rales, rhonci, wheezing or stridor. Cardio: Heart sounds are normal with regular rate and rhythm and no murmurs, rubs or gallops. Peripheral pulses are normal and equal bilaterally without edema. No aortic or femoral bruits. Chest: symmetric with normal excursions and percussion.  Abdomen: Flat, soft, with nl bowel sounds. Nontender, no guarding, rebound, hernias, masses, or organomegaly.  Lymphatics:  Non tender without lymphadenopathy.  Musculoskeletal: Full ROM all peripheral extremities, joint stability, 5/5 strength, and normal gait. Skin: Warm and dry without rashes, lesions, cyanosis, clubbing or  ecchymosis.  Neuro: Cranial nerves intact, reflexes equal bilaterally. Normal muscle tone, no cerebellar symptoms. Sensation intact.  Pysch: Alert and oriented X 3 with normal affect, insight and judgment appropriate.   Cognitive Testing  Alert? Yes  Normal Appearance? Yes  Oriented to person? Yes  Place? Yes   Time? Yes  Recall of three objects?  Yes  Can perform simple calculations? Yes  Displays appropriate judgment? Yes  Can read the correct time from a watch/clock? Yes  Medicare Attestation I have personally reviewed: The patient's medical and social history Their use of alcohol, tobacco or illicit drugs Their current medications and supplements The  patient's functional ability including ADLs,fall risks, home safety risks, cognitive, and hearing and visual impairment Diet and physical activities Evidence for depression or mood disorders  The patient's weight, height, BMI, and visual acuity have been recorded in the chart.  I have made referrals, counseling, and provided education to the patient based on review of the above and I have provided the patient with a written personalized care plan for preventive services.  Over 40 minutes of exam, counseling, chart review was performed.  Caylin Raby DAVID, MD   05/20/2015

## 2015-05-20 NOTE — Patient Instructions (Signed)
Recommend Adult Low dose Aspirin or   coated  Aspirin 81 mg daily   To reduce risk of Colon Cancer 20 %,   Skin Cancer 26 % ,   Melanoma 46%   and   Pancreatic cancer 60%  ++++++++++++++++++  Vitamin D goal   is between 70-100.   Please make sure that you are taking your Vitamin D as directed.   It is very important as a natural anti-inflammatory   helping hair, skin, and nails, as well as reducing stroke and heart attack risk.   It helps your bones and helps with mood.  It also decreases numerous cancer risks so please take it as directed.   Low Vit D is associated with a 200-300% higher risk for CANCER   and 200-300% higher risk for HEART   ATTACK  &  STROKE.   ......................................  It is also associated with higher death rate at younger ages,   autoimmune diseases like Rheumatoid arthritis, Lupus, Multiple Sclerosis.     Also many other serious conditions, like depression, Alzheimer's  Dementia, infertility, muscle aches, fatigue, fibromyalgia - just to name a few.  +++++++++++++++++++  Recommend the book "The END of DIETING" by Dr Joel Fuhrman   & the book "The END of DIABETES " by Dr Joel Fuhrman  At [employer] - get book & Audio CD's     Being diabetic has a  300% increased risk for heart attack, stroke, cancer, and alzheimer- type vascular dementia. It is very important that you work harder with diet by avoiding all foods that are white. Avoid white rice (brown & wild rice is OK), white potatoes (sweetpotatoes in moderation is OK), White bread or wheat bread or anything made out of white flour like bagels, donuts, rolls, buns, biscuits, cakes, pastries, cookies, pizza crust, and pasta (made from white flour & egg whites) - vegetarian pasta or spinach or wheat pasta is OK. Multigrain breads like Suha[employer]'s or [employer], or multigrain sandwich thins or flatbreads.  Diet, exercise and weight loss can reverse and cure diabetes in the early  stages.  Diet, exercise and weight loss is very important in the control and prevention of complications of diabetes which affects every system in your body, ie. Brain - dementia/stroke, eyes - glaucoma/blindness, heart - heart attack/heart failure, kidneys - dialysis, stomach - gastric paralysis, intestines - malabsorption, nerves - severe painful neuritis, circulation - gangrene & loss of a leg(s), and finally cancer and Alzheimers.    I recommend avoid fried & greasy foods,  sweets/candy, white rice (brown or wild rice or Quinoa is OK), white potatoes (sweet potatoes are OK) - anything made from white flour - bagels, doughnuts, rolls, buns, biscuits,white and wheat breads, pizza crust and traditional pasta made of white flour & egg white(vegetarian pasta or spinach or wheat pasta is OK).  Multi-grain bread is OK - like multi-grain flat bread or sandwich thins. Avoid alcohol in excess. Exercise is also important.    Eat all the vegetables you want - avoid meat, especially red meat and dairy - especially cheese.  Cheese is the most concentrated form of trans-fats which is the worst thing to clog up our arteries. Veggie cheese is OK which can be found in the fresh produce section at [employer] or [employer] or [employer]  ++++++++++++++++++++++++++   

## 2015-05-21 LAB — VITAMIN D 25 HYDROXY (VIT D DEFICIENCY, FRACTURES): Vit D, 25-Hydroxy: 93 ng/mL (ref 30–100)

## 2015-05-21 LAB — INSULIN, RANDOM: Insulin: 6.4 u[IU]/mL (ref 2.0–19.6)

## 2015-05-21 LAB — HEMOGLOBIN A1C
Hgb A1c MFr Bld: 6.4 % — ABNORMAL HIGH (ref <5.7)
Mean Plasma Glucose: 137 mg/dL — ABNORMAL HIGH (ref <117)

## 2015-05-25 ENCOUNTER — Other Ambulatory Visit: Payer: Self-pay | Admitting: Physician Assistant

## 2015-05-25 ENCOUNTER — Other Ambulatory Visit: Payer: Self-pay | Admitting: Internal Medicine

## 2015-05-31 ENCOUNTER — Other Ambulatory Visit: Payer: Self-pay | Admitting: *Deleted

## 2015-05-31 MED ORDER — ATENOLOL 100 MG PO TABS: 100.0000 mg | ORAL_TABLET | Freq: Every day | ORAL | Status: DC

## 2015-07-16 ENCOUNTER — Encounter: Payer: Self-pay | Admitting: *Deleted

## 2015-08-26 ENCOUNTER — Encounter: Payer: Self-pay | Admitting: Physician Assistant

## 2015-08-26 ENCOUNTER — Ambulatory Visit (INDEPENDENT_AMBULATORY_CARE_PROVIDER_SITE_OTHER): Payer: PPO | Admitting: Physician Assistant

## 2015-08-26 VITALS — BP 134/70 | HR 43 | Temp 97.9°F | Resp 14 | Ht 68.5 in | Wt 191.0 lb

## 2015-08-26 DIAGNOSIS — Z79899 Other long term (current) drug therapy: Secondary | ICD-10-CM | POA: Diagnosis not present

## 2015-08-26 DIAGNOSIS — I10 Essential (primary) hypertension: Secondary | ICD-10-CM

## 2015-08-26 DIAGNOSIS — E1121 Type 2 diabetes mellitus with diabetic nephropathy: Secondary | ICD-10-CM

## 2015-08-26 DIAGNOSIS — E559 Vitamin D deficiency, unspecified: Secondary | ICD-10-CM | POA: Diagnosis not present

## 2015-08-26 DIAGNOSIS — Z1389 Encounter for screening for other disorder: Secondary | ICD-10-CM

## 2015-08-26 DIAGNOSIS — E782 Mixed hyperlipidemia: Secondary | ICD-10-CM | POA: Diagnosis not present

## 2015-08-26 DIAGNOSIS — R972 Elevated prostate specific antigen [PSA]: Secondary | ICD-10-CM | POA: Diagnosis not present

## 2015-08-26 DIAGNOSIS — Z1331 Encounter for screening for depression: Secondary | ICD-10-CM

## 2015-08-26 LAB — BASIC METABOLIC PANEL WITH GFR
BUN: 21 mg/dL (ref 7–25)
CHLORIDE: 101 mmol/L (ref 98–110)
CO2: 26 mmol/L (ref 20–31)
Calcium: 9.9 mg/dL (ref 8.6–10.3)
Creat: 1.21 mg/dL — ABNORMAL HIGH (ref 0.70–1.18)
GFR, EST AFRICAN AMERICAN: 69 mL/min (ref >=60)
GFR, EST NON AFRICAN AMERICAN: 60 mL/min (ref >=60)
Glucose, Bld: 86 mg/dL (ref 65–99)
POTASSIUM: 4.9 mmol/L (ref 3.5–5.3)
SODIUM: 139 mmol/L (ref 135–146)

## 2015-08-26 LAB — CBC WITH DIFFERENTIAL/PLATELET
BASOS ABS: 0.1 10*3/uL (ref 0.0–0.1)
BASOS PCT: 1 % (ref 0–1)
EOS ABS: 0.8 10*3/uL — AB (ref 0.0–0.7)
EOS PCT: 8 % — AB (ref 0–5)
HCT: 43.8 % (ref 39.0–52.0)
Hemoglobin: 15 g/dL (ref 13.0–17.0)
LYMPHS ABS: 2.6 10*3/uL (ref 0.7–4.0)
Lymphocytes Relative: 27 % (ref 12–46)
MCH: 29.6 pg (ref 26.0–34.0)
MCHC: 34.2 g/dL (ref 30.0–36.0)
MCV: 86.6 fL (ref 78.0–100.0)
MPV: 9.9 fL (ref 8.6–12.4)
Monocytes Absolute: 0.8 10*3/uL (ref 0.1–1.0)
Monocytes Relative: 8 % (ref 3–12)
NEUTROS PCT: 56 % (ref 43–77)
Neutro Abs: 5.5 10*3/uL (ref 1.7–7.7)
PLATELETS: 265 10*3/uL (ref 150–400)
RBC: 5.06 MIL/uL (ref 4.22–5.81)
RDW: 13.6 % (ref 11.5–15.5)
WBC: 9.8 10*3/uL (ref 4.0–10.5)

## 2015-08-26 LAB — HEMOGLOBIN A1C
Hgb A1c MFr Bld: 6.3 % — ABNORMAL HIGH (ref <5.7)
Mean Plasma Glucose: 134 mg/dL — ABNORMAL HIGH (ref <117)

## 2015-08-26 LAB — LIPID PANEL
CHOL/HDL RATIO: 3.8 ratio (ref <=5.0)
Cholesterol: 184 mg/dL (ref 125–200)
HDL: 49 mg/dL (ref >=40)
LDL CALC: 110 mg/dL (ref <130)
TRIGLYCERIDES: 125 mg/dL (ref <150)
VLDL: 25 mg/dL (ref <30)

## 2015-08-26 LAB — HEPATIC FUNCTION PANEL
ALBUMIN: 4.6 g/dL (ref 3.6–5.1)
ALK PHOS: 57 U/L (ref 40–115)
ALT: 17 U/L (ref 9–46)
AST: 18 U/L (ref 10–35)
BILIRUBIN DIRECT: 0.1 mg/dL (ref <=0.2)
BILIRUBIN TOTAL: 0.7 mg/dL (ref 0.2–1.2)
Indirect Bilirubin: 0.6 mg/dL (ref 0.2–1.2)
Total Protein: 6.9 g/dL (ref 6.1–8.1)

## 2015-08-26 LAB — MAGNESIUM: Magnesium: 2 mg/dL (ref 1.5–2.5)

## 2015-08-26 NOTE — Progress Notes (Signed)
Assessment and Plan:  1. Hypertension -Continue medication but has asymptomatic sinus brady due to atenolol use, suggest stopping atenolol and taking a whole benazepril, monitor blood pressure at home. Continue DASH diet.  Reminder to go to the ER if any CP, SOB, nausea, dizziness, severe HA, changes vision/speech, left arm numbness and tingling and jaw pain.  2. Cholesterol -Continue diet and exercise. Check cholesterol.   3. Diabetes with diabetic chronic kidney disease -Continue diet and exercise. Check A1C  4. Vitamin D Def - check level and continue medications.   5. Depression screen negative   Continue diet and meds as discussed. Further disposition pending results of labs. Discussed med's effects and SE's.    Over 30 minutes of exam, counseling, chart review, and critical decision making was performed Future Appointments Date Time Provider [place]  11/01/2015 9:00 AM Unk Pinto, MD [facility] None  12/02/2015 11:00 AM Unk Pinto, MD [facility] None     HPI 69 y.o. male  presents for 3 month follow up on hypertension, cholesterol, diabetes and vitamin D deficiency.   His blood pressure has been controlled at home, he is on atenolol 100mg  1/2 pill daily and he is benazepril 40mg  daily,, today his BP is BP: 134/70 mmHg.  He does not workout. He denies chest pain, shortness of breath, dizziness.  He is not on cholesterol medication and denies myalgias. His cholesterol is at goal. The cholesterol was:   Lab Results  Component Value Date   CHOL 169 05/20/2015   HDL 45 05/20/2015   LDLCALC 103 05/20/2015   TRIG 106 05/20/2015   CHOLHDL 3.8 05/20/2015    He has been working on diet and exercise for diabetes with diabetic chronic kidney disease, he is on bASA, he is on ACE/ARB, and denies  paresthesia of the feet, polydipsia, polyuria and visual disturbances. Last A1C was:  Lab Results  Component Value Date   HGBA1C 6.4* 05/20/2015  Last GFR: Lab Results   Component Value Date   GFRNONAA 57* 05/20/2015   Patient is on Vitamin D supplement. Lab Results  Component Value Date   VD25OH 93 05/20/2015     Current Medications:  Current Outpatient Prescriptions on File Prior to Visit  Medication Sig Dispense Refill  . aspirin 81 MG tablet Take 81 mg by mouth daily.    Marland Kitchen atenolol (TENORMIN) 100 MG tablet Take 1 tablet (100 mg total) by mouth daily. 90 tablet 3  . benazepril (LOTENSIN) 40 MG tablet TAKE 1 TABLET BY MOUTH EVERY DAY FOR BLOOD PRESSURE 90 tablet 3  . Cholecalciferol (VITAMIN D) 2000 UNITS CAPS Take 4,000 Units by mouth daily.    Marland Kitchen glipiZIDE (GLUCOTROL) 5 MG tablet TAKE 1/2 TO 1 TABLET BY MOUTH TWICE DAILY WITH MEALS 180 tablet 1  . glucose blood test strip Check glucose 1 time daily. 100 each 12  . magnesium gluconate (MAGONATE) 500 MG tablet Take 500 mg by mouth daily.    . metFORMIN (GLUCOPHAGE-XR) 500 MG 24 hr tablet TAKE 1-2 TABLETS TWICE A DAY FOR DIABETES 360 tablet 0  . metFORMIN (GLUCOPHAGE-XR) 500 MG 24 hr tablet TAKE 1-2 TABLETS TWICE A DAY FOR DIABETES 360 tablet 1  . Omega-3 Fatty Acids (FISH OIL PO) Take 12,002 mg by mouth 2 (two) times daily.     . Red Yeast Rice 600 MG CAPS Take 600 mg by mouth. Takes 4 per day     No current facility-administered medications on file prior to visit.   Medical History:  Past Medical History  Diagnosis Date  . Hyperlipidemia   . Hypertension   . Diabetes mellitus without complication   . Vitamin D deficiency   . GERD (gastroesophageal reflux disease)   . ED (erectile dysfunction)   . Colitis   . BPH (benign prostatic hyperplasia)   . Kidney stone    Allergies:  Allergies  Allergen Reactions  . Aspirin   . Ppd [Tuberculin Purified Protein Derivative] Other (See Comments)    Positive tb test.  . Tricor [Fenofibrate] Other (See Comments)    GI upset     Review of Systems:  Review of Systems  Constitutional: Negative for fever, chills and malaise/fatigue.  HENT:  Positive for hearing loss (wears hearing aids). Negative for congestion, sore throat and tinnitus.   Eyes: Negative.   Respiratory: Negative for cough, shortness of breath and wheezing.   Cardiovascular: Negative for chest pain, palpitations and leg swelling.  Gastrointestinal: Negative for heartburn, nausea, vomiting, diarrhea, constipation, blood in stool and melena.  Genitourinary: Negative for dysuria, urgency and frequency.  Skin: Negative.   Neurological: Negative for dizziness, sensory change, loss of consciousness and headaches.  Psychiatric/Behavioral: Negative for depression. The patient is not nervous/anxious and does not have insomnia.     Family history- Review and unchanged Social history- Review and unchanged Physical Exam: BP 134/70 mmHg  Pulse 43  Temp(Src) 97.9 F (36.6 C) (Temporal)  Resp 14  Ht 5' 8.5" (1.74 m)  Wt 191 lb (86.637 kg)  BMI 28.62 kg/m2  SpO2 96% Wt Readings from Last 3 Encounters:  08/26/15 191 lb (86.637 kg)  05/20/15 189 lb 6.4 oz (85.911 kg)  02/11/15 193 lb (87.544 kg)   General Appearance: Well nourished, in no apparent distress. Eyes: PERRLA, EOMs, conjunctiva no swelling or erythema Sinuses: No Frontal/maxillary tenderness ENT/Mouth: Ext aud canals clear, TMs without erythema, bulging. No erythema, swelling, or exudate on post pharynx.  Tonsils not swollen or erythematous. Hearing abnormal, wears hearing aids.   Neck: Supple, thyroid normal.  Respiratory: Respiratory effort normal, BS equal bilaterally without rales, rhonchi, wheezing or stridor.  Cardio: RRR with no MRGs. Brisk peripheral pulses without edema.  Abdomen: Soft, + BS.  Non tender, no guarding, rebound, hernias, masses. Lymphatics: Non tender without lymphadenopathy.  Musculoskeletal: Full ROM, 5/5 strength, Normal gait Skin: Warm, dry without rashes, lesions, ecchymosis.  Neuro: Cranial nerves intact. No cerebellar symptoms.  Psych: Awake and oriented X 3, normal affect,  Insight and Judgment appropriate.    Vicie Mutters, PA-C 9:55 AM [facility]

## 2015-08-26 NOTE — Patient Instructions (Addendum)
Your heart rate is very slow from the atenolol that you are on, this can lead to dizziness, falls, or fatigue.  Please the atenolol OR can take 1/4 a pill and please take 1 pill of the benazepril.  Monitor your BP at home.   Monitor your blood pressure at home. Go to the ER if any CP, SOB, nausea, dizziness, severe HA, changes vision/speech  Goal BP:  For patients 60 and older: Goal BP < 150/90. Your most recent BP: BP: 134/70 mmHg   Take your medications faithfully as instructed. Maintain a healthy weight. Get at least 150 minutes of aerobic exercise per week. Minimize salt intake. Minimize alcohol intake  DASH Eating Plan DASH stands for "Dietary Approaches to Stop Hypertension." The DASH eating plan is a healthy eating plan that has been shown to reduce high blood pressure (hypertension). Additional health benefits may include reducing the risk of type 2 diabetes mellitus, heart disease, and stroke. The DASH eating plan may also help with weight loss. WHAT DO I NEED TO KNOW ABOUT THE DASH EATING PLAN? For the DASH eating plan, you will follow these general guidelines:  Choose foods with a percent daily value for sodium of less than 5% (as listed on the food label).  Use salt-free seasonings or herbs instead of table salt or sea salt.  Check with your health care provider or pharmacist before using salt substitutes.  Eat lower-sodium products, often labeled as "lower sodium" or "no salt added."  Eat fresh foods.  Eat more vegetables, fruits, and low-fat dairy products.  Choose whole grains. Look for the word "whole" as the first word in the ingredient list.  Choose fish and skinless chicken or [place] more often than red meat. Limit fish, poultry, and meat to 6 oz (170 g) each day.  Limit sweets, desserts, sugars, and sugary drinks.  Choose heart-healthy fats.  Limit cheese to 1 oz (28 g) per day.  Eat more home-cooked food and less restaurant, buffet, and fast  food.  Limit fried foods.  Cook foods using methods other than frying.  Limit canned vegetables. If you do use them, rinse them well to decrease the sodium.  When eating at a restaurant, ask that your food be prepared with less salt, or no salt if possible. WHAT FOODS CAN I EAT? Seek help from a dietitian for individual calorie needs. Grains Whole grain or whole wheat bread. Brown rice. Whole grain or whole wheat pasta. Quinoa, bulgur, and whole grain cereals. Low-sodium cereals. Corn or whole wheat flour tortillas. Whole grain cornbread. Whole grain crackers. Low-sodium crackers. Vegetables Fresh or frozen vegetables (raw, steamed, roasted, or grilled). Low-sodium or reduced-sodium tomato and vegetable juices. Low-sodium or reduced-sodium tomato sauce and paste. Low-sodium or reduced-sodium canned vegetables.  Fruits All fresh, canned (in natural juice), or frozen fruits. Meat and Other Protein Products Ground beef (85% or leaner), grass-fed beef, or beef trimmed of fat. Skinless chicken or [place]. Ground chicken or [place]. Pork trimmed of fat. All fish and seafood. Eggs. Dried beans, peas, or lentils. Unsalted nuts and seeds. Unsalted canned beans. Dairy Low-fat dairy products, such as skim or 1% milk, 2% or reduced-fat cheeses, low-fat ricotta or cottage cheese, or plain low-fat yogurt. Low-sodium or reduced-sodium cheeses. Fats and Oils Tub margarines without trans fats. Light or reduced-fat mayonnaise and salad dressings (reduced sodium). Avocado. Safflower, olive, or canola oils. Natural peanut or almond butter. Other Unsalted popcorn and pretzels. The items listed above may not be a complete list of  recommended foods or beverages. Contact your dietitian for more options. WHAT FOODS ARE NOT RECOMMENDED? Grains White bread. White pasta. White rice. Refined cornbread. Bagels and croissants. Crackers that contain trans fat. Vegetables Creamed or fried vegetables. Vegetables in a  cheese sauce. Regular canned vegetables. Regular canned tomato sauce and paste. Regular tomato and vegetable juices. Fruits Dried fruits. Canned fruit in light or heavy syrup. Fruit juice. Meat and Other Protein Products Fatty cuts of meat. Ribs, chicken wings, bacon, sausage, bologna, salami, chitterlings, fatback, hot dogs, bratwurst, and packaged luncheon meats. Salted nuts and seeds. Canned beans with salt. Dairy Whole or 2% milk, cream, half-and-half, and cream cheese. Whole-fat or sweetened yogurt. Full-fat cheeses or blue cheese. Nondairy creamers and whipped toppings. Processed cheese, cheese spreads, or cheese curds. Condiments Onion and garlic salt, seasoned salt, table salt, and sea salt. Canned and packaged gravies. Worcestershire sauce. Tartar sauce. Barbecue sauce. Teriyaki sauce. Soy sauce, including reduced sodium. Steak sauce. Fish sauce. Oyster sauce. Cocktail sauce. Horseradish. Ketchup and mustard. Meat flavorings and tenderizers. Bouillon cubes. Hot sauce. Tabasco sauce. Marinades. Taco seasonings. Relishes. Fats and Oils Butter, stick margarine, lard, shortening, ghee, and bacon fat. Coconut, palm kernel, or palm oils. Regular salad dressings. Other Pickles and olives. Salted popcorn and pretzels. The items listed above may not be a complete list of foods and beverages to avoid. Contact your dietitian for more information. WHERE CAN I FIND MORE INFORMATION? National Heart, Lung, and Blood Institute: travelstabloid.com Document Released: 09/13/2011 Document Revised: 02/08/2014 Document Reviewed: 07/29/2013 [facility] Patient Information 2015 [place], [place]. This information is not intended to replace advice given to you by your health care provider. Make sure you discuss any questions you have with your health care provider.  Bradycardia Bradycardia is a slower-than-normal heart rate. A normal resting heart rate for an adult ranges from 60 to  100 beats per minute. With bradycardia, the resting heart rate is less than 60 beats per minute. Bradycardia is a problem if your heart cannot pump enough oxygen-rich blood through your body. Bradycardia is not a problem for everyone. For some healthy adults, a slow resting heart rate is normal.  CAUSES  Bradycardia may be caused by:  A problem with the heart's electrical system, such as heart block.  A problem with the heart's natural pacemaker (sinus node).  Heart disease, damage, or infection.  Certain medicines that treat heart conditions.  Certain conditions, such as hypothyroidism and obstructive sleep apnea. RISK FACTORS  Risk factors include:  Being 58 or older.  Having high blood pressure (hypertension), high cholesterol (hyperlipidemia), or diabetes.  Drinking heavily, using tobacco products, or using drugs.  Being stressed. SIGNS AND SYMPTOMS  Signs and symptoms include:  Light-headedness.  Faintingor near fainting.  Fatigue and weakness.  Shortness of breath.  Chest pain (angina).  Drowsiness.  Confusion.  Dizziness. DIAGNOSIS  Diagnosis of bradycardia may include:  A physical exam.  An electrocardiogram (ECG).  Blood tests. TREATMENT  Treatment for bradycardia may include:  Treatment of an underlying condition.  Pacemaker placement. A pacemaker is a small, battery-powered device that is placed under the skin and is programmed to sense your heartbeats. If your heart rate is lower than the programmed rate, the pacemaker will pace your heart.  Changing your medicines or dosages. HOME CARE INSTRUCTIONS  Take medicines only as directed by your health care provider.  Manage any health conditions that contribute to bradycardia as directed by your health care provider.  Follow a heart-healthy diet. A dietitian can  help educate you on healthy food options and changes.  Follow an exercise program approved by your health care provider.  Maintain  a healthy weight. Lose weight as approved by your health care provider.  Do not use tobacco products, including cigarettes, chewing tobacco, or electronic cigarettes. If you need help quitting, ask your health care provider.  Do not use illegal drugs.  Limit alcohol intake to no more than 1 drink per day for nonpregnant women and 2 drinks per day for men. One drink equals 12 ounces of beer, 5 ounces of wine, or 1 ounces of hard liquor.  Keep all follow-up visits as directed by your health care provider. This is important. SEEK MEDICAL CARE IF:  You feel light-headed or dizzy.  You almost faint.  You feel weak or are easily fatigued during physical activity.  You experience confusion or have memory problems. SEEK IMMEDIATE MEDICAL CARE IF:   You faint.  You have an irregular heartbeat.  You have chest pain.  You have trouble breathing. MAKE SURE YOU:   Understand these instructions.  Will watch your condition.  Will get help right away if you are not doing well or get worse.   This information is not intended to replace advice given to you by your health care provider. Make sure you discuss any questions you have with your health care provider.   Document Released: 06/16/2002 Document Revised: 10/15/2014 Document Reviewed: 12/30/2013 Elsevier Interactive Patient Education [employer].

## 2015-08-26 NOTE — Addendum Note (Signed)
Addended by: Vicie Mutters R on: 08/26/2015 10:13 AM   Modules accepted: Orders

## 2015-08-27 LAB — VITAMIN D 25 HYDROXY (VIT D DEFICIENCY, FRACTURES): VIT D 25 HYDROXY: 92 ng/mL (ref 30–100)

## 2015-08-27 LAB — TSH: TSH: 1.807 u[IU]/mL (ref 0.350–4.500)

## 2015-09-27 ENCOUNTER — Inpatient Hospital Stay (HOSPITAL_COMMUNITY)
Admission: EM | Admit: 2015-09-27 | Discharge: 2015-09-29 | DRG: 247 | Disposition: A | Discharge status: Home / Self Care | Payer: PPO | Attending: Cardiology | Admitting: Cardiology

## 2015-09-27 ENCOUNTER — Encounter (HOSPITAL_COMMUNITY)
Admission: EM | Disposition: A | Discharge status: Home / Self Care | Payer: PPO | Source: Home / Self Care | Attending: Cardiology

## 2015-09-27 ENCOUNTER — Emergency Department (HOSPITAL_COMMUNITY): Payer: PPO

## 2015-09-27 ENCOUNTER — Other Ambulatory Visit: Payer: Self-pay | Admitting: Internal Medicine

## 2015-09-27 ENCOUNTER — Encounter (HOSPITAL_COMMUNITY): Payer: Self-pay

## 2015-09-27 DIAGNOSIS — Z87891 Personal history of nicotine dependence: Secondary | ICD-10-CM

## 2015-09-27 DIAGNOSIS — E785 Hyperlipidemia, unspecified: Secondary | ICD-10-CM | POA: Diagnosis present

## 2015-09-27 DIAGNOSIS — Z79899 Other long term (current) drug therapy: Secondary | ICD-10-CM | POA: Diagnosis not present

## 2015-09-27 DIAGNOSIS — Z886 Allergy status to analgesic agent status: Secondary | ICD-10-CM

## 2015-09-27 DIAGNOSIS — Z888 Allergy status to other drugs, medicaments and biological substances status: Secondary | ICD-10-CM

## 2015-09-27 DIAGNOSIS — Z955 Presence of coronary angioplasty implant and graft: Secondary | ICD-10-CM

## 2015-09-27 DIAGNOSIS — N189 Chronic kidney disease, unspecified: Secondary | ICD-10-CM | POA: Diagnosis present

## 2015-09-27 DIAGNOSIS — E1121 Type 2 diabetes mellitus with diabetic nephropathy: Secondary | ICD-10-CM | POA: Diagnosis not present

## 2015-09-27 DIAGNOSIS — I237 Postinfarction angina: Secondary | ICD-10-CM | POA: Diagnosis present

## 2015-09-27 DIAGNOSIS — I129 Hypertensive chronic kidney disease with stage 1 through stage 4 chronic kidney disease, or unspecified chronic kidney disease: Secondary | ICD-10-CM | POA: Diagnosis present

## 2015-09-27 DIAGNOSIS — K219 Gastro-esophageal reflux disease without esophagitis: Secondary | ICD-10-CM | POA: Diagnosis present

## 2015-09-27 DIAGNOSIS — I25110 Atherosclerotic heart disease of native coronary artery with unstable angina pectoris: Secondary | ICD-10-CM | POA: Diagnosis present

## 2015-09-27 DIAGNOSIS — E782 Mixed hyperlipidemia: Secondary | ICD-10-CM | POA: Diagnosis not present

## 2015-09-27 DIAGNOSIS — Z833 Family history of diabetes mellitus: Secondary | ICD-10-CM

## 2015-09-27 DIAGNOSIS — I10 Essential (primary) hypertension: Secondary | ICD-10-CM | POA: Diagnosis not present

## 2015-09-27 DIAGNOSIS — Z7982 Long term (current) use of aspirin: Secondary | ICD-10-CM

## 2015-09-27 DIAGNOSIS — Z7984 Long term (current) use of oral hypoglycemic drugs: Secondary | ICD-10-CM | POA: Diagnosis not present

## 2015-09-27 DIAGNOSIS — I2510 Atherosclerotic heart disease of native coronary artery without angina pectoris: Secondary | ICD-10-CM

## 2015-09-27 DIAGNOSIS — Z8249 Family history of ischemic heart disease and other diseases of the circulatory system: Secondary | ICD-10-CM

## 2015-09-27 DIAGNOSIS — I214 Non-ST elevation (NSTEMI) myocardial infarction: Principal | ICD-10-CM | POA: Diagnosis present

## 2015-09-27 DIAGNOSIS — E1122 Type 2 diabetes mellitus with diabetic chronic kidney disease: Secondary | ICD-10-CM | POA: Diagnosis present

## 2015-09-27 DIAGNOSIS — E559 Vitamin D deficiency, unspecified: Secondary | ICD-10-CM | POA: Diagnosis present

## 2015-09-27 DIAGNOSIS — N400 Enlarged prostate without lower urinary tract symptoms: Secondary | ICD-10-CM | POA: Diagnosis present

## 2015-09-27 HISTORY — PX: CARDIAC CATHETERIZATION: SHX172

## 2015-09-27 HISTORY — DX: Atherosclerotic heart disease of native coronary artery without angina pectoris: I25.10

## 2015-09-27 LAB — CBC
HCT: 44.3 % (ref 39.0–52.0)
HCT: 38.4 % — ABNORMAL LOW (ref 39.0–52.0)
Hemoglobin: 14.4 g/dL (ref 13.0–17.0)
Hemoglobin: 13 g/dL (ref 13.0–17.0)
MCH: 28.7 pg (ref 26.0–34.0)
MCH: 29.6 pg (ref 26.0–34.0)
MCHC: 32.5 g/dL (ref 30.0–36.0)
MCHC: 33.9 g/dL (ref 30.0–36.0)
MCV: 88.2 fL (ref 78.0–100.0)
MCV: 87.5 fL (ref 78.0–100.0)
PLATELETS: 201 10*3/uL (ref 150–400)
Platelets: 241 K/uL (ref 150–400)
RBC: 5.02 MIL/uL (ref 4.22–5.81)
RBC: 4.39 MIL/uL (ref 4.22–5.81)
RDW: 13.1 % (ref 11.5–15.5)
RDW: 13.2 % (ref 11.5–15.5)
WBC: 14.1 K/uL — ABNORMAL HIGH (ref 4.0–10.5)
WBC: 12.5 10*3/uL — ABNORMAL HIGH (ref 4.0–10.5)

## 2015-09-27 LAB — BASIC METABOLIC PANEL WITH GFR
Anion gap: 11 (ref 5–15)
BUN: 14 mg/dL (ref 6–20)
CO2: 26 mmol/L (ref 22–32)
Calcium: 9.3 mg/dL (ref 8.9–10.3)
Chloride: 103 mmol/L (ref 101–111)
Creatinine, Ser: 1.06 mg/dL (ref 0.61–1.24)
GFR calc Af Amer: >60 mL/min (ref >60)
GFR calc non Af Amer: >60 mL/min (ref >60)
Glucose, Bld: 124 mg/dL — ABNORMAL HIGH (ref 65–99)
Potassium: 4.4 mmol/L (ref 3.5–5.1)
Sodium: 140 mmol/L (ref 135–145)

## 2015-09-27 LAB — CREATININE, SERUM
Creatinine, Ser: 1.05 mg/dL (ref 0.61–1.24)
GFR calc Af Amer: >60 mL/min (ref >60)
GFR calc non Af Amer: >60 mL/min (ref >60)

## 2015-09-27 LAB — I-STAT TROPONIN, ED: Troponin i, poc: 7.57 ng/mL (ref 0.00–0.08)

## 2015-09-27 LAB — TROPONIN I: Troponin I: 18.09 ng/mL (ref <0.031)

## 2015-09-27 LAB — POCT ACTIVATED CLOTTING TIME: Activated Clotting Time: 533 seconds

## 2015-09-27 LAB — TSH: TSH: 2.993 u[IU]/mL (ref 0.350–4.500)

## 2015-09-27 SURGERY — LEFT HEART CATH AND CORONARY ANGIOGRAPHY
Anesthesia: LOCAL

## 2015-09-27 MED ORDER — ACETAMINOPHEN 325 MG PO TABS: 650.0000 mg | ORAL_TABLET | ORAL | Status: DC | PRN

## 2015-09-27 MED ORDER — ONDANSETRON HCL 4 MG/2ML IJ SOLN: 4.0000 mg | Freq: Four times a day (QID) | INTRAMUSCULAR | Status: DC | PRN

## 2015-09-27 MED ORDER — VERAPAMIL HCL 2.5 MG/ML IV SOLN
INTRAVENOUS | Status: AC
  Filled 2015-09-27: qty 2

## 2015-09-27 MED ORDER — BIVALIRUDIN 250 MG IV SOLR
INTRAVENOUS | Status: AC
  Filled 2015-09-27: qty 250

## 2015-09-27 MED ORDER — MORPHINE SULFATE (PF) 2 MG/ML IV SOLN: 2.0000 mg | INTRAVENOUS | Status: DC | PRN

## 2015-09-27 MED ORDER — SODIUM CHLORIDE 0.9 % IJ SOLN
3.0000 mL | Freq: Two times a day (BID) | INTRAMUSCULAR | Status: DC
  Administered 2015-09-28 (×2): 3 mL via INTRAVENOUS

## 2015-09-27 MED ORDER — MIDAZOLAM HCL 2 MG/2ML IJ SOLN
INTRAMUSCULAR | Status: DC | PRN
  Administered 2015-09-27: 1 mg via INTRAVENOUS

## 2015-09-27 MED ORDER — ASPIRIN 81 MG PO CHEW
324.0000 mg | CHEWABLE_TABLET | Freq: Once | ORAL | Status: AC
  Administered 2015-09-27: 324 mg via ORAL
  Filled 2015-09-27: qty 4

## 2015-09-27 MED ORDER — NITROGLYCERIN 1 MG/10 ML FOR IR/CATH LAB
INTRA_ARTERIAL | Status: AC
  Filled 2015-09-27: qty 10

## 2015-09-27 MED ORDER — TICAGRELOR 90 MG PO TABS
ORAL_TABLET | ORAL | Status: DC | PRN
  Administered 2015-09-27: 180 mg via ORAL

## 2015-09-27 MED ORDER — SODIUM CHLORIDE 0.9 % IV SOLN: 250.0000 mL | INTRAVENOUS | Status: DC | PRN

## 2015-09-27 MED ORDER — ATORVASTATIN CALCIUM 80 MG PO TABS
80.0000 mg | ORAL_TABLET | Freq: Every day | ORAL | Status: DC
  Administered 2015-09-28: 80 mg via ORAL
  Filled 2015-09-27: qty 1

## 2015-09-27 MED ORDER — HEPARIN BOLUS VIA INFUSION
4000.0000 [IU] | Freq: Once | INTRAVENOUS | Status: DC
  Filled 2015-09-27: qty 4000

## 2015-09-27 MED ORDER — LIDOCAINE HCL (PF) 1 % IJ SOLN
INTRAMUSCULAR | Status: AC
  Filled 2015-09-27: qty 30

## 2015-09-27 MED ORDER — MIDAZOLAM HCL 2 MG/2ML IJ SOLN
INTRAMUSCULAR | Status: AC
  Filled 2015-09-27: qty 2

## 2015-09-27 MED ORDER — TICAGRELOR 90 MG PO TABS
90.0000 mg | ORAL_TABLET | Freq: Two times a day (BID) | ORAL | Status: DC
  Administered 2015-09-28 – 2015-09-29 (×3): 90 mg via ORAL
  Filled 2015-09-27 (×3): qty 1

## 2015-09-27 MED ORDER — HEPARIN (PORCINE) IN NACL 100-0.45 UNIT/ML-% IJ SOLN
1050.0000 [IU]/h | INTRAMUSCULAR | Status: DC
  Administered 2015-09-27 (×2): 1050 [IU]/h via INTRAVENOUS
  Filled 2015-09-27: qty 250

## 2015-09-27 MED ORDER — BENAZEPRIL HCL 40 MG PO TABS
40.0000 mg | ORAL_TABLET | Freq: Every day | ORAL | Status: DC
  Administered 2015-09-28: 40 mg via ORAL
  Filled 2015-09-27 (×2): qty 1
  Filled 2015-09-27: qty 2

## 2015-09-27 MED ORDER — ASPIRIN EC 81 MG PO TBEC
81.0000 mg | DELAYED_RELEASE_TABLET | Freq: Every day | ORAL | Status: DC
  Administered 2015-09-28 – 2015-09-29 (×2): 81 mg via ORAL
  Filled 2015-09-27 (×2): qty 1

## 2015-09-27 MED ORDER — HEPARIN SODIUM (PORCINE) 5000 UNIT/ML IJ SOLN
5000.0000 [IU] | Freq: Three times a day (TID) | INTRAMUSCULAR | Status: DC
  Administered 2015-09-28 – 2015-09-29 (×4): 5000 [IU] via SUBCUTANEOUS
  Filled 2015-09-27 (×4): qty 1

## 2015-09-27 MED ORDER — INSULIN ASPART 100 UNIT/ML ~~LOC~~ SOLN
0.0000 [IU] | Freq: Three times a day (TID) | SUBCUTANEOUS | Status: DC
  Administered 2015-09-28 – 2015-09-29 (×2): 1 [IU] via SUBCUTANEOUS

## 2015-09-27 MED ORDER — TICAGRELOR 90 MG PO TABS
ORAL_TABLET | ORAL | Status: AC
  Filled 2015-09-27: qty 1

## 2015-09-27 MED ORDER — IOHEXOL 350 MG/ML SOLN
INTRAVENOUS | Status: DC | PRN
  Administered 2015-09-27: 350 mL via INTRAVENOUS

## 2015-09-27 MED ORDER — VERAPAMIL HCL 2.5 MG/ML IV SOLN
INTRA_ARTERIAL | Status: DC | PRN
  Administered 2015-09-27: 18:00:00 via INTRA_ARTERIAL

## 2015-09-27 MED ORDER — FENTANYL CITRATE (PF) 100 MCG/2ML IJ SOLN
INTRAMUSCULAR | Status: AC
  Filled 2015-09-27: qty 2

## 2015-09-27 MED ORDER — SODIUM CHLORIDE 0.9 % IV SOLN
250.0000 mg | INTRAVENOUS | Status: DC | PRN
  Administered 2015-09-27: 1.75 mg/kg/h via INTRAVENOUS
  Administered 2015-09-27: 250 mg

## 2015-09-27 MED ORDER — NITROGLYCERIN 0.4 MG SL SUBL: 0.4000 mg | SUBLINGUAL_TABLET | SUBLINGUAL | Status: DC | PRN

## 2015-09-27 MED ORDER — FENTANYL CITRATE (PF) 100 MCG/2ML IJ SOLN
INTRAMUSCULAR | Status: DC | PRN
  Administered 2015-09-27: 50 ug via INTRAVENOUS
  Administered 2015-09-27: 25 ug via INTRAVENOUS

## 2015-09-27 MED ORDER — HEPARIN (PORCINE) IN NACL 2-0.9 UNIT/ML-% IJ SOLN
INTRAMUSCULAR | Status: AC
  Filled 2015-09-27: qty 1000

## 2015-09-27 MED ORDER — OMEGA-3-ACID ETHYL ESTERS 1 G PO CAPS
1.0000 g | ORAL_CAPSULE | Freq: Two times a day (BID) | ORAL | Status: DC
  Administered 2015-09-28 – 2015-09-29 (×4): 1 g via ORAL
  Filled 2015-09-27 (×4): qty 1

## 2015-09-27 MED ORDER — SODIUM CHLORIDE 0.9 % WEIGHT BASED INFUSION
3.0000 mL/kg/h | INTRAVENOUS | Status: AC
  Administered 2015-09-27 (×2): 3 mL/kg/h via INTRAVENOUS

## 2015-09-27 MED ORDER — BIVALIRUDIN BOLUS VIA INFUSION - CUPID
INTRAVENOUS | Status: DC | PRN
  Administered 2015-09-27: 64.65 mg via INTRAVENOUS

## 2015-09-27 MED ORDER — HEPARIN SODIUM (PORCINE) 1000 UNIT/ML IJ SOLN
INTRAMUSCULAR | Status: AC
  Filled 2015-09-27: qty 1

## 2015-09-27 MED ORDER — SODIUM CHLORIDE 0.9 % IJ SOLN: 3.0000 mL | INTRAMUSCULAR | Status: DC | PRN

## 2015-09-27 MED ORDER — NITROGLYCERIN 1 MG/10 ML FOR IR/CATH LAB
INTRA_ARTERIAL | Status: DC | PRN
  Administered 2015-09-27 (×2)

## 2015-09-27 MED ORDER — ATENOLOL 100 MG PO TABS
100.0000 mg | ORAL_TABLET | Freq: Every day | ORAL | Status: DC
  Filled 2015-09-27: qty 1

## 2015-09-27 SURGICAL SUPPLY — 30 items
BALLN EMERGE MR 2.0X8 (BALLOONS) ×2
BALLN EUPHORA RX 2.5X15 (BALLOONS) ×2
BALLN MINITREK OTW 1.5X6 (BALLOONS) ×2
BALLN ~~LOC~~ EUPHORA RX 3.0X20 (BALLOONS) ×2
BALLN ~~LOC~~ EUPHORA RX 3.5X12 (BALLOONS) ×2
BALLOON EMERGE MR 2.0X8 (BALLOONS) ×1 IMPLANT
BALLOON EUPHORA RX 2.5X15 (BALLOONS) ×1 IMPLANT
BALLOON MINITREK OTW 1.5X6 (BALLOONS) ×1 IMPLANT
BALLOON ~~LOC~~ EUPHORA RX 3.0X20 (BALLOONS) ×1 IMPLANT
BALLOON ~~LOC~~ EUPHORA RX 3.5X12 (BALLOONS) ×1 IMPLANT
CATH INFINITI 5FR ANG PIGTAIL (CATHETERS) ×2 IMPLANT
CATH OPTITORQUE TIG 4.0 5F (CATHETERS) ×2 IMPLANT
CATH VISTA GUIDE 6FR XBLAD3.5 (CATHETERS) ×2 IMPLANT
DEVICE RAD COMP TR BAND LRG (VASCULAR PRODUCTS) ×2 IMPLANT
GLIDESHEATH SLEND A-KIT 6F 22G (SHEATH) ×2 IMPLANT
KIT ENCORE 26 ADVANTAGE (KITS) ×2 IMPLANT
KIT HEART LEFT (KITS) ×2 IMPLANT
PACK CARDIAC CATHETERIZATION (CUSTOM PROCEDURE TRAY) ×2 IMPLANT
STENT PROMUS PREM MR 2.75X28 (Permanent Stent) ×2 IMPLANT
STENT PROMUS PREM MR 2.75X38 (Permanent Stent) ×2 IMPLANT
STENT PROMUS PREM MR 3.0X24 (Permanent Stent) ×2 IMPLANT
SYR MEDRAD MARK V 150ML (SYRINGE) ×2 IMPLANT
TRANSDUCER W/STOPCOCK (MISCELLANEOUS) ×2 IMPLANT
TUBING CIL FLEX 10 FLL-RA (TUBING) ×2 IMPLANT
WIRE ASAHI FIELDER XT 300CM (WIRE) ×2 IMPLANT
WIRE ASAHI PROWATER 180CM (WIRE) ×2 IMPLANT
WIRE HI TORQ BMW 190CM (WIRE) ×2 IMPLANT
WIRE HI TORQ WHISPER MS 190CM (WIRE) ×2 IMPLANT
WIRE LUGE 182CM (WIRE) ×2 IMPLANT
WIRE SAFE-T 1.5MM-J .035X260CM (WIRE) ×2 IMPLANT

## 2015-09-27 NOTE — ED Provider Notes (Signed)
CSN: [id number]     Arrival date & time 09/27/15  1442 History   First MD Initiated Contact with Patient 09/27/15 1603     Chief Complaint  Patient presents with  . Chest Pain     (Consider location/radiation/quality/duration/timing/severity/associated sxs/prior Treatment) HPI Comments: Patient here complaining of substernal chest pain which began last night at rest. No associated dyspnea, diaphoresis, nausea vomiting. Pain is been intermittent all day long. Denies any prior history of same. No exertional component to it. No prior history of same. Denies any palpitations, syncope or near-syncope. No cough or congestion. Nothing makes the symptoms better or worse. No treatment use prior to arrival  Patient is a 69 y.o. male presenting with chest pain. The history is provided by the patient and the spouse.  Chest Pain   Past Medical History  Diagnosis Date  . Hyperlipidemia   . Hypertension   . Diabetes mellitus without complication ([place])   . Vitamin D deficiency   . GERD (gastroesophageal reflux disease)   . ED (erectile dysfunction)   . Colitis   . BPH (benign prostatic hyperplasia)   . Kidney stone    Past Surgical History  Procedure Laterality Date  . Cystoscopy w/ retrogrades  1991    stone extractions  . Flexible sigmoidoscopy  1993    negative  . Squamous cell carcinoma excision  2001    bridge of nose   Family History  Problem Relation Age of Onset  . Heart disease Mother   . Heart attack Mother   . Heart disease Father   . Heart attack Father   . Diabetes Maternal Aunt   . Heart disease Brother   . Stroke Brother   . Hypertension Brother   . Cancer Cousin   . Leukemia Cousin    Social History  Substance Use Topics  . Smoking status: Former Smoker    Quit date: 10/08/1962  . Smokeless tobacco: None  . Alcohol Use: No    Review of Systems  Cardiovascular: Positive for chest pain.  All other systems reviewed and are negative.     Allergies   Aspirin; Ppd; and Tricor  Home Medications   Prior to Admission medications   Medication Sig Start Date End Date Taking? Authorizing Provider  aspirin 81 MG tablet Take 81 mg by mouth daily.    Historical Provider, MD  atenolol (TENORMIN) 100 MG tablet Take 1 tablet (100 mg total) by mouth daily. 05/31/15   Unk Pinto, MD  benazepril (LOTENSIN) 40 MG tablet TAKE 1 TABLET BY MOUTH EVERY DAY FOR BLOOD PRESSURE 05/25/15   Unk Pinto, MD  Cholecalciferol (VITAMIN D) 2000 UNITS CAPS Take 4,000 Units by mouth daily.    Historical Provider, MD  glipiZIDE (GLUCOTROL) 5 MG tablet TAKE 1/2 TO 1 TABLET BY MOUTH TWICE DAILY WITH MEALS 05/25/15   Unk Pinto, MD  glucose blood test strip Check glucose 1 time daily. 08/10/14   Unk Pinto, MD  magnesium gluconate (MAGONATE) 500 MG tablet Take 500 mg by mouth daily.    Historical Provider, MD  metFORMIN (GLUCOPHAGE-XR) 500 MG 24 hr tablet TAKE 1-2 TABLETS TWICE A DAY FOR DIABETES 05/25/15   Unk Pinto, MD  Omega-3 Fatty Acids (FISH OIL PO) Take 12,002 mg by mouth 2 (two) times daily.     Historical Provider, MD  Red Yeast Rice 600 MG CAPS Take 600 mg by mouth. Takes 4 per day    Historical Provider, MD   BP 144/63 mmHg  Pulse 52  Temp(Src) 98.6 F (37 C)  Resp 19  Wt 86.183 kg  SpO2 96% Physical Exam  Constitutional: He is oriented to person, place, and time. He appears well-developed and well-nourished.  Non-toxic appearance. No distress.  HENT:  Head: Normocephalic and atraumatic.  Eyes: Conjunctivae, EOM and lids are normal. Pupils are equal, round, and reactive to light.  Neck: Normal range of motion. Neck supple. No tracheal deviation present. No thyroid mass present.  Cardiovascular: Normal rate, regular rhythm and normal heart sounds.  Exam reveals no gallop.   No murmur heard. Pulmonary/Chest: Effort normal and breath sounds normal. No stridor. No respiratory distress. He has no decreased breath sounds. He has no wheezes.  He has no rhonchi. He has no rales.  Abdominal: Soft. Normal appearance and bowel sounds are normal. He exhibits no distension. There is no tenderness. There is no rebound and no CVA tenderness.  Musculoskeletal: Normal range of motion. He exhibits no edema or tenderness.  Neurological: He is alert and oriented to person, place, and time. He has normal strength. No cranial nerve deficit or sensory deficit. GCS eye subscore is 4. GCS verbal subscore is 5. GCS motor subscore is 6.  Skin: Skin is warm and dry. No abrasion and no rash noted.  Psychiatric: He has a normal mood and affect. His speech is normal and behavior is normal.  Nursing note and vitals reviewed.   ED Course  Procedures (including critical care time) Labs Review Labs Reviewed  BASIC METABOLIC PANEL - Abnormal; Notable for the following:    Glucose, Bld 124 (*)    All other components within normal limits  CBC - Abnormal; Notable for the following:    WBC 14.1 (*)    All other components within normal limits  I-STAT TROPOININ, ED - Abnormal; Notable for the following:    Troponin i, poc 7.57 (*)    All other components within normal limits    Imaging Review Dg Chest 2 View  09/27/2015  CLINICAL DATA:  Chest pain. History of hypertension, GERD and diabetes. Former smoker. EXAM: CHEST  2 VIEW COMPARISON:  None. FINDINGS: There is partial obscuration of the left heart border secondary to age-indeterminate though presumably chronic mild elevation of left hemidiaphragm. Otherwise, normal cardiac silhouette and mediastinal contours. Atherosclerotic plaque within the thoracic aorta. No focal airspace opacities. No pleural effusion or pneumothorax. No evidence of edema. No acute osseus abnormalities. Degenerative change of the right acromioclavicular joint, incompletely evaluated. There is moderate gas distention of the stomach below the left hemidiaphragm. No pneumoperitoneum. IMPRESSION: No acute cardiopulmonary disease.  Electronically Signed   By: Sandi Mariscal M.D.   On: 09/27/2015 15:17   I have personally reviewed and evaluated these images and lab results as part of my medical decision-making.   EKG Interpretation   Date/Time:  Tuesday September 27 2015 14:56:09 EST Ventricular Rate:  57 PR Interval:  222 QRS Duration: 92 QT Interval:  438 QTC Calculation: 426 R Axis:   -3 Text Interpretation:  Sinus bradycardia with 1st degree A-V block  Otherwise normal ECG Confirmed by Emaly Boschert  MD, Elizabelle Fite ([id number]) on 09/27/2015  4:12:16 PM      MDM   Final diagnoses:  None    Patient's elevated troponin was noted. His EKG is without signs of acute ischemia. Will start on heparin per pharmacy as well as give aspirin here. He is currently pain-free. Will Consult cardiology    Lacretia Leigh, MD 09/27/15 [phone]

## 2015-09-27 NOTE — ED Notes (Signed)
Cardiology at bedside.

## 2015-09-27 NOTE — ED Notes (Signed)
Patient reports that the pain has been constant since 1030pm with some nausea, non-radiating. Alert and oriented

## 2015-09-27 NOTE — H&P (Signed)
Cardiologist:  Daniel Burnett is an 69 y.o. male.   Chief Complaint: Chest Pain HPI:   The patient is a 69 yo male, former welder/supervisor, with no prior cardiac history but with a PMH of HTN, HLD, DM, GERD, BPH, Colitis.  He also has a strong family history of CAD.  No tobacco.  His PCP recommended he be on a statin.  He reports developing CP last night around 2300hrs along with mild nausea.  The pain lasted through the night.  He finally fell asleep around 0200hrs.  He call his family doctor who recommended he com to the ER.  He continues to have 1/10 CP.  No radiation to arm, neck, back or jaw.  Initial troponin is 7.57.  No acute EKG changes.     Medications:  Medication Sig  aspirin 81 MG tablet Take 81 mg by mouth daily.  atenolol (TENORMIN) 100 MG tablet Take 1 tablet (100 mg total) by mouth daily.  benazepril (LOTENSIN) 40 MG tablet TAKE 1 TABLET BY MOUTH EVERY DAY FOR BLOOD PRESSURE  Cholecalciferol (VITAMIN D) 2000 UNITS CAPS Take 4,000 Units by mouth daily.  glipiZIDE (GLUCOTROL) 5 MG tablet TAKE 1/2 TO 1 TABLET BY MOUTH TWICE DAILY WITH MEALS  glucose blood test strip Check glucose 1 time daily.  magnesium gluconate (MAGONATE) 500 MG tablet Take 500 mg by mouth daily.  metFORMIN (GLUCOPHAGE-XR) 500 MG 24 hr tablet TAKE 1-2 TABLETS TWICE A DAY FOR DIABETES  Omega-3 Fatty Acids (FISH OIL PO) Take 12,002 mg by mouth 2 (two) times daily.   Red Yeast Rice 600 MG CAPS Take 600 mg by mouth. Takes 4 per day      Past Medical History  Diagnosis Date  . Hyperlipidemia   . Hypertension   . Diabetes mellitus without complication ([place])   . Vitamin D deficiency   . GERD (gastroesophageal reflux disease)   . ED (erectile dysfunction)   . Colitis   . BPH (benign prostatic hyperplasia)   . Kidney stone     Past Surgical History  Procedure Laterality Date  . Cystoscopy w/ retrogrades  1991    stone extractions  . Flexible sigmoidoscopy  1993    negative  . Squamous  cell carcinoma excision  2001    bridge of nose    Family History  Problem Relation Age of Onset  . Heart disease Mother   . Heart attack Mother   . Heart disease Father   . Heart attack Father   . Diabetes Maternal Aunt   . Heart disease Brother   . Stroke Brother   . Hypertension Brother   . Cancer Cousin   . Leukemia Cousin    Social History:  reports that he quit smoking about 53 years ago. He does not have any smokeless tobacco history on file. He reports that he does not drink alcohol or use illicit drugs.  Allergies:  Allergies  Allergen Reactions  . Aspirin   . Ppd [Tuberculin Purified Protein Derivative] Other (See Comments)    Positive tb test.  . Tricor [Fenofibrate] Other (See Comments)    GI upset     (Not in a hospital admission)  Results for orders placed or performed during the hospital encounter of 09/27/15 (from the past 48 hour(s))  Basic metabolic panel     Status: Abnormal   Collection Time: 09/27/15  3:09 PM  Result Value Ref Range   Sodium 140 135 - 145 mmol/L   Potassium 4.4  3.5 - 5.1 mmol/L   Chloride 103 101 - 111 mmol/L   CO2 26 22 - 32 mmol/L   Glucose, Bld 124 (H) 65 - 99 mg/dL   BUN 14 6 - 20 mg/dL   Creatinine, Ser 1.06 0.61 - 1.24 mg/dL   Calcium 9.3 8.9 - 10.3 mg/dL   GFR calc non Af Amer >60 >60 mL/min   GFR calc Af Amer >60 >60 mL/min    Comment: (NOTE) The eGFR has been calculated using the CKD EPI equation. This calculation has not been validated in all clinical situations. eGFR's persistently <60 mL/min signify possible Chronic Kidney Disease.    Anion gap 11 5 - 15  CBC     Status: Abnormal   Collection Time: 09/27/15  3:09 PM  Result Value Ref Range   WBC 14.1 (H) 4.0 - 10.5 K/uL   RBC 5.02 4.22 - 5.81 MIL/uL   Hemoglobin 14.4 13.0 - 17.0 g/dL   HCT 44.3 39.0 - 52.0 %   MCV 88.2 78.0 - 100.0 fL   MCH 28.7 26.0 - 34.0 pg   MCHC 32.5 30.0 - 36.0 g/dL   RDW 13.1 11.5 - 15.5 %   Platelets 241 150 - 400 K/uL  I-stat  troponin, ED (not at [facility], [facility])     Status: Abnormal   Collection Time: 09/27/15  3:32 PM  Result Value Ref Range   Troponin i, poc 7.57 (HH) 0.00 - 0.08 ng/mL   Comment NOTIFIED PHYSICIAN    Comment 3            Comment: Due to the release kinetics of cTnI, a negative result within the first hours of the onset of symptoms does not rule out myocardial infarction with certainty. If myocardial infarction is still suspected, repeat the test at appropriate intervals.    Dg Chest 2 View  09/27/2015  CLINICAL DATA:  Chest pain. History of hypertension, GERD and diabetes. Former smoker. EXAM: CHEST  2 VIEW COMPARISON:  None. FINDINGS: There is partial obscuration of the left heart border secondary to age-indeterminate though presumably chronic mild elevation of left hemidiaphragm. Otherwise, normal cardiac silhouette and mediastinal contours. Atherosclerotic plaque within the thoracic aorta. No focal airspace opacities. No pleural effusion or pneumothorax. No evidence of edema. No acute osseus abnormalities. Degenerative change of the right acromioclavicular joint, incompletely evaluated. There is moderate gas distention of the stomach below the left hemidiaphragm. No pneumoperitoneum. IMPRESSION: No acute cardiopulmonary disease. Electronically Signed   By: Sandi Mariscal M.D.   On: 09/27/2015 15:17   Lipid Panel     Component Value Date/Time   CHOL 184 08/26/2015 1014   TRIG 125 08/26/2015 1014   HDL 49 08/26/2015 1014   CHOLHDL 3.8 08/26/2015 1014   VLDL 25 08/26/2015 1014   LDLCALC 110 08/26/2015 1014     Review of Systems  Constitutional: Negative for fever and diaphoresis.  HENT: Negative for congestion.   Respiratory: Negative for cough and shortness of breath.   Cardiovascular: Positive for chest pain. Negative for orthopnea, leg swelling and PND.  Gastrointestinal: Positive for nausea.  Musculoskeletal: Negative for myalgias.  All other systems reviewed and are  negative.   Blood pressure 144/68, pulse 59, temperature 98.6 F (37 C), resp. rate 16, weight 190 lb (86.183 kg), SpO2 98 %. Physical Exam  Nursing note and vitals reviewed. Constitutional: He is oriented to person, place, and time. He appears well-developed and well-nourished. No distress.  HENT:  Head: Normocephalic and atraumatic.  Eyes:  EOM are normal. Pupils are equal, round, and reactive to light.  Neck: Normal range of motion. Neck supple.  Cardiovascular: Regular rhythm, S1 normal and S2 normal.   Murmur heard.  Systolic murmur is present with a grade of 1/6  Pulses:      Radial pulses are 2+ on the right side, and 2+ on the left side.       Dorsalis pedis pulses are 2+ on the right side, and 2+ on the left side.  Respiratory: Effort normal and breath sounds normal. He has no wheezes. He has no rales.  GI: Soft. Bowel sounds are normal. He exhibits no distension. There is no tenderness.  Musculoskeletal: He exhibits no edema.  Neurological: He is alert and oriented to person, place, and time. He exhibits normal muscle tone.  Skin: Skin is warm.  Psychiatric: He has a normal mood and affect.     Assessment/Plan Principal Problem:   NSTEMI (non-ST elevated myocardial infarction) [facility]) Active Problems:   Essential hypertension   Hyperlipidemia   T2_NIDDM w/ CKD  The patient will be taken to the cath lab for coronary angiography before being admitted.  Recent lipid panel(08/2015) above; LDL 110.  Start statin.  Continue ASA, BB, ACE-I(tomorrow).  Hold metformin.  Add sliding scale for DM.  Consider Echo.   Tarri Fuller, [place] 09/27/2015, 5:07 PM   Patient seen and examined and history reviewed. Agree with above findings and plan. 69 yo WM with no prior history of CAD presents with over 12 hours of continuous chest pain. Pain improved but still present. Ecg shows subtle diffuse ST depression. Troponin elevated 7.57. Multiple cardiac risk factors including DM type 2, HTN,  hyperlipidemia, and family history of CAD. With ongoing pain recommend urgent cardiac cath. The procedure and risks were reviewed including but not limited to death, myocardial infarction, stroke, arrythmias, bleeding, transfusion, emergency surgery, dye allergy, or renal dysfunction. The patient voices understanding and is agreeable to proceed. Also recommend high dose statin therapy. Will hold metformin and cover with SSI.  Adyn Serna [place], [place] 09/27/2015 5:33 PM

## 2015-09-27 NOTE — H&P (Signed)
History and Physical Interval Note:  NAME:  Daniel Burnett   MRN: [id number] DOB:  April 25, 1946   ADMIT DATE: 09/27/2015   09/27/2015 5:20 PM  Daniel Burnett is a 69 y.o. male with a history of hypertension, hyperlipidemia, type 2 diabetes and colitis with a strong family history of premature CAD who presents with signs symptoms concerning for recent non-STEMI beginning last night. He now presented emergency room with recurrent post infarction angina symptoms.  Troponin is already elevated with 7.57 suggesting that his MI was last night chest pain. Her chest pain is post infarct angina. He was seen and evaluated by Dr. Martinique along with Mr. Tarri Fuller, PA-C in the emergency room and referred for invasive evaluation cardiac catheterization based on ongoing chest pain   Past Medical History  Diagnosis Date  . Hyperlipidemia   . Hypertension   . Diabetes mellitus without complication ([place])   . Vitamin D deficiency   . GERD (gastroesophageal reflux disease)   . ED (erectile dysfunction)   . Colitis   . BPH (benign prostatic hyperplasia)   . Kidney stone    Past Surgical History  Procedure Laterality Date  . Cystoscopy w/ retrogrades  1991    stone extractions  . Flexible sigmoidoscopy  1993    negative  . Squamous cell carcinoma excision  2001    bridge of nose    FAMHx: Family History  Problem Relation Age of Onset  . Heart disease Mother   . Heart attack Mother   . Heart disease Father   . Heart attack Father   . Diabetes Maternal Aunt   . Heart disease Brother   . Stroke Brother   . Hypertension Brother   . Cancer Cousin   . Leukemia Cousin     SOCHx:  reports that he quit smoking about 53 years ago. He does not have any smokeless tobacco history on file. He reports that he does not drink alcohol or use illicit drugs.  ALLERGIES: Allergies  Allergen Reactions  . Aspirin   . Ppd [Tuberculin Purified Protein Derivative] Other (See Comments)    Positive tb test.  .  Tricor [Fenofibrate] Other (See Comments)    GI upset    HOME MEDICATIONS: Prescriptions prior to admission  Medication Sig Dispense Refill Last Dose  . aspirin 81 MG tablet Take 81 mg by mouth daily.   Taking  . atenolol (TENORMIN) 100 MG tablet Take 1 tablet (100 mg total) by mouth daily. 90 tablet 3   . benazepril (LOTENSIN) 40 MG tablet TAKE 1 TABLET BY MOUTH EVERY DAY FOR BLOOD PRESSURE 90 tablet 3   . Cholecalciferol (VITAMIN D) 2000 UNITS CAPS Take 4,000 Units by mouth daily.   Taking  . glipiZIDE (GLUCOTROL) 5 MG tablet TAKE 1/2 TO 1 TABLET BY MOUTH TWICE DAILY WITH MEALS 180 tablet 1   . glucose blood test strip Check glucose 1 time daily. 100 each 12 Taking  . magnesium gluconate (MAGONATE) 500 MG tablet Take 500 mg by mouth daily.   Taking  . metFORMIN (GLUCOPHAGE-XR) 500 MG 24 hr tablet TAKE 1-2 TABLETS TWICE A DAY FOR DIABETES 360 tablet 1   . Omega-3 Fatty Acids (FISH OIL PO) Take 12,002 mg by mouth 2 (two) times daily.    Taking  . Red Yeast Rice 600 MG CAPS Take 600 mg by mouth. Takes 4 per day   Taking    IMPRESSION & PLAN The patients' history has been reviewed, patient examined, no change  in status from most recent note, stable for surgery. I have reviewed the patients' chart and labs. Questions were answered to the patient's satisfaction.    Dellia Beckwith Hach has presented today for surgery, with the diagnosis of chest pain The various methods of treatment have been discussed with the patient and family.   Risks / Complications include, but not limited to: Death, MI, CVA/TIA, VF/VT (with defibrillation), Bradycardia (need for temporary pacer placement), contrast induced nephropathy, bleeding / bruising / hematoma / pseudoaneurysm, vascular or coronary injury (with possible emergent CT or Vascular Surgery), adverse medication reactions, infection.     After consideration of risks, benefits and other options for treatment, the patient has consented to Procedure(s):  LEFT  HEART CATHETERIZATION AND CORONARY ANGIOGRAPHY +/- AD [place]  as a surgical intervention.   We will proceed with the planned procedure.  Cath Lab Visit (complete for each Cath Lab visit)  Clinical Evaluation Leading to the Procedure:   ACS: Yes.    Non-ACS:    Anginal Classification: CCS IV  Anti-ischemic medical therapy: Maximal Therapy (2 or more classes of medications)  Non-Invasive Test Results: No non-invasive testing performed  Prior CABG: No previous CABG  AUC: TIMI Score  Patient Information:  TIMI Score is 5  Revascularization of the presumed culprit artery  A (9)  Indication: 11; Score: 9 TIMI Score  Patient Information:  TIMI Score is 5  Revascularization of multiple coronary arteries when the culprit artery cannot clearly be determined  A (9)  Indication: 12; Score: 9    Daniel Burnett. [place], [place]  [id number]  [phone]  09/27/2015 5:20 PM

## 2015-09-27 NOTE — ED Notes (Signed)
Called mini lab regarding i stat troponin - states that it is 7.57. Called charge nurse. Pt getting next available room.

## 2015-09-27 NOTE — ED Notes (Signed)
Placed pt on zoll pads and removed all clothing.  Belongings given to wife.

## 2015-09-27 NOTE — ED Notes (Signed)
Pt transported to cath lab by Junie Panning, [place].

## 2015-09-27 NOTE — Progress Notes (Signed)
ANTICOAGULATION CONSULT NOTE - Initial Consult  Pharmacy Consult for heparin Indication: chest pain/ACS  Allergies  Allergen Reactions  . Aspirin   . Ppd [Tuberculin Purified Protein Derivative] Other (See Comments)    Positive tb test.  . Tricor [Fenofibrate] Other (See Comments)    GI upset    Patient Measurements: Weight: 190 lb (86.183 kg) Heparin Dosing Weight: 86 kg IBW 68.4 kg  Vital Signs: Temp: 98.6 F (37 C) (12/20 1453) BP: 144/63 mmHg (12/20 1601) Pulse Rate: 52 (12/20 1601)  Labs:  Recent Labs  09/27/15 1509  HGB 14.4  HCT 44.3  PLT 241  CREATININE 1.06    Estimated Creatinine Clearance: 68.9 mL/min (by C-G formula based on Cr of 1.06).   Medical History: Past Medical History  Diagnosis Date  . Hyperlipidemia   . Hypertension   . Diabetes mellitus without complication ([place])   . Vitamin D deficiency   . GERD (gastroesophageal reflux disease)   . ED (erectile dysfunction)   . Colitis   . BPH (benign prostatic hyperplasia)   . Kidney stone     Medications:  See EMR  Assessment: Admitted with substernal chest pain, troponin 7.57, received aspirin 324 mg x1 in the ED. Pt is not on any anticoagulation PTA. CBC stable. Will start heparin for rule out ACS.   Goal of Therapy:  Heparin level 0.3-0.7 units/ml Monitor platelets by anticoagulation protocol: Yes    Plan:  -Heparin bolus 4000 units x1 then 1050 units/hr -Daily HL, CBC -Monitor s/sx bleeding -First level in 8 hours   Harvel Quale 09/27/2015,4:27 PM

## 2015-09-27 NOTE — Progress Notes (Signed)
CRITICAL VALUE ALERT  Critical value received:  Troponin 18.09  Date of notification:  09/27/2015  Time of notification:  22:50  Critical value read back:Yes.    Nurse who received alert:  Ezekiel Slocumb  MD notified (1st page):  Karlyn Agee   Time of first page:  22:52  Responding MD:  Karlyn Agee  Time MD responded:  23:45

## 2015-09-28 ENCOUNTER — Encounter (HOSPITAL_COMMUNITY): Payer: Self-pay | Admitting: Cardiology

## 2015-09-28 ENCOUNTER — Inpatient Hospital Stay (HOSPITAL_COMMUNITY): Payer: PPO

## 2015-09-28 DIAGNOSIS — I25110 Atherosclerotic heart disease of native coronary artery with unstable angina pectoris: Secondary | ICD-10-CM

## 2015-09-28 LAB — CBC
HCT: 39.6 % (ref 39.0–52.0)
HEMOGLOBIN: 13.5 g/dL (ref 13.0–17.0)
MCH: 30 pg (ref 26.0–34.0)
MCHC: 34.1 g/dL (ref 30.0–36.0)
MCV: 88 fL (ref 78.0–100.0)
Platelets: 206 10*3/uL (ref 150–400)
RBC: 4.5 MIL/uL (ref 4.22–5.81)
RDW: 13.3 % (ref 11.5–15.5)
WBC: 11.2 10*3/uL — AB (ref 4.0–10.5)

## 2015-09-28 LAB — BASIC METABOLIC PANEL
ANION GAP: 8 (ref 5–15)
BUN: 14 mg/dL (ref 6–20)
CALCIUM: 9.2 mg/dL (ref 8.9–10.3)
CO2: 23 mmol/L (ref 22–32)
Chloride: 106 mmol/L (ref 101–111)
Creatinine, Ser: 1.12 mg/dL (ref 0.61–1.24)
GFR calc Af Amer: >60 mL/min (ref >60)
GLUCOSE: 131 mg/dL — AB (ref 65–99)
Potassium: 4 mmol/L (ref 3.5–5.1)
SODIUM: 137 mmol/L (ref 135–145)

## 2015-09-28 LAB — GLUCOSE, CAPILLARY
GLUCOSE-CAPILLARY: 126 mg/dL — AB (ref 65–99)
GLUCOSE-CAPILLARY: 110 mg/dL — AB (ref 65–99)
GLUCOSE-CAPILLARY: 89 mg/dL (ref 65–99)
Glucose-Capillary: 99 mg/dL (ref 65–99)
Glucose-Capillary: 102 mg/dL — ABNORMAL HIGH (ref 65–99)

## 2015-09-28 LAB — TROPONIN I
TROPONIN I: 12.96 ng/mL — AB (ref <0.031)
TROPONIN I: 9.74 ng/mL — AB (ref <0.031)

## 2015-09-28 LAB — MRSA PCR SCREENING: MRSA by PCR: NEGATIVE

## 2015-09-28 MED ORDER — GLIPIZIDE 5 MG PO TABS
2.5000 mg | ORAL_TABLET | Freq: Two times a day (BID) | ORAL | Status: DC
  Administered 2015-09-28 – 2015-09-29 (×2): 2.5 mg via ORAL
  Filled 2015-09-28 (×4): qty 1

## 2015-09-28 MED ORDER — ATENOLOL 25 MG PO TABS
12.5000 mg | ORAL_TABLET | Freq: Every day | ORAL | Status: DC
  Administered 2015-09-29: 12.5 mg via ORAL
  Filled 2015-09-28: qty 1

## 2015-09-28 MED FILL — Heparin Sodium (Porcine) Inj 1000 Unit/ML: INTRAMUSCULAR | Qty: 30 | Status: AC

## 2015-09-28 NOTE — Progress Notes (Addendum)
Report called to [facility] on unit 2W. Patient to be transferred to unit 2W33. Patient states that he will call his family when he arrives to his new room.

## 2015-09-28 NOTE — Progress Notes (Signed)
CARDIAC REHAB PHASE I   PRE:  Rate/Rhythm: 66 SB  BP:  Supine:   Sitting: 133/62  Standing:    SaO2: 98%RA  MODE:  Ambulation: 700 ft   POST:  Rate/Rhythm: 72 SR  BP:  Supine:   Sitting: 129/64  Standing:    SaO2:  1150-1300 Pt walked 700 ft with steady gait and no CP. Tolerated well. MI education completed with pt and family who voiced understanding. Stressed importance of brilinta with stents. Needs to see case manager for brilinta card. Reviewed carb counting, heart healthy diet choices, ex ed and risk factors. Referring to [place] Phase 2.    Graylon Good, RN BSN  09/28/2015 12:58 PM

## 2015-09-28 NOTE — Progress Notes (Signed)
TELEMETRY: Reviewed telemetry pt in marked sinus brady: Filed Vitals:   09/28/15 0615 09/28/15 0723 09/28/15 0800 09/28/15 1114  BP: 108/57 99/57 115/54 112/74  Pulse: 43 52 47 57  Temp:  97.4 F (36.3 C)  98.3 F (36.8 C)  TempSrc:  Oral  Oral  Resp: 16 16 20 19   Height:      Weight:      SpO2: 97% 97% 97% 97%    Intake/Output Summary (Last 24 hours) at 09/28/15 1154 Last data filed at 09/28/15 0400  Gross per 24 hour  Intake 784.42 ml  Output   2010 ml  Net -1225.58 ml   Filed Weights   09/27/15 1453 09/27/15 2035 09/28/15 0500  Weight: 86.183 kg (190 lb) 91.627 kg (202 lb) 92 kg (202 lb 13.2 oz)    Subjective Feels well today. Only mild indigestion last night. No other chest pain or dyspnea.  Marland Kitchen aspirin EC  81 mg Oral Daily  . [START ON 09/29/2015] atenolol  12.5 mg Oral Daily  . atorvastatin  80 mg Oral q1800  . benazepril  40 mg Oral Daily  . glipiZIDE  2.5 mg Oral BID AC  . heparin  5,000 Units Subcutaneous 3 times per day  . insulin aspart  0-9 Units Subcutaneous TID WC  . omega-3 acid ethyl esters  1 g Oral BID  . sodium chloride  3 mL Intravenous Q12H  . ticagrelor  90 mg Oral BID      LABS: Basic Metabolic Panel:  Recent Labs  09/27/15 1509 09/27/15 2148 09/28/15 0320  NA 140  --  137  K 4.4  --  4.0  CL 103  --  106  CO2 26  --  23  GLUCOSE 124*  --  131*  BUN 14  --  14  CREATININE 1.06 1.05 1.12  CALCIUM 9.3  --  9.2   Liver Function Tests: No results for input(s): AST, ALT, ALKPHOS, BILITOT, PROT, ALBUMIN in the last 72 hours. No results for input(s): LIPASE, AMYLASE in the last 72 hours. CBC:  Recent Labs  09/27/15 2148 09/28/15 0320  WBC 12.5* 11.2*  HGB 13.0 13.5  HCT 38.4* 39.6  MCV 87.5 88.0  PLT 201 206   Cardiac Enzymes:  Recent Labs  09/27/15 2148 09/28/15 0320 09/28/15 0945  TROPONINI 18.09* 12.96* 9.74*   BNP: No results for input(s): PROBNP in the last 72 hours. D-Dimer: No results for input(s): DDIMER  in the last 72 hours. Hemoglobin A1C: No results for input(s): HGBA1C in the last 72 hours. Fasting Lipid Panel: No results for input(s): CHOL, HDL, LDLCALC, TRIG, CHOLHDL, LDLDIRECT in the last 72 hours. Thyroid Function Tests:  Recent Labs  09/27/15 2148  TSH 2.993     Radiology/Studies:  Dg Chest 2 View  09/27/2015  CLINICAL DATA:  Chest pain. History of hypertension, GERD and diabetes. Former smoker. EXAM: CHEST  2 VIEW COMPARISON:  None. FINDINGS: There is partial obscuration of the left heart border secondary to age-indeterminate though presumably chronic mild elevation of left hemidiaphragm. Otherwise, normal cardiac silhouette and mediastinal contours. Atherosclerotic plaque within the thoracic aorta. No focal airspace opacities. No pleural effusion or pneumothorax. No evidence of edema. No acute osseus abnormalities. Degenerative change of the right acromioclavicular joint, incompletely evaluated. There is moderate gas distention of the stomach below the left hemidiaphragm. No pneumoperitoneum. IMPRESSION: No acute cardiopulmonary disease. Electronically Signed   By: Sandi Mariscal M.D.   On: 09/27/2015 15:17   Ecg  shows sinus brady, no acute ST changes. I have personally reviewed and interpreted this study.  PHYSICAL EXAM General: Well developed, well nourished, in no acute distress. Head: Normocephalic, atraumatic, sclera non-icteric, oropharynx is clear Neck: Negative for carotid bruits. JVD not elevated. No adenopathy Lungs: Clear bilaterally to auscultation without wheezes, rales, or rhonchi. Breathing is unlabored. Heart: RRR S1 S2 without murmurs, rubs, or gallops.  Abdomen: Soft, non-tender, non-distended with normoactive bowel sounds. No hepatomegaly. No rebound/guarding. No obvious abdominal masses. Msk:  Strength and tone appears normal for age. Extremities: No clubbing, cyanosis or edema.  Distal pedal pulses are 2+ and equal bilaterally. Neuro: Alert and oriented X 3.  Moves all extremities spontaneously. Psych:  Responds to questions appropriately with a normal affect.  ASSESSMENT AND PLAN: 1. NSTEMI. Cardiac cath showed severe 3 vessel CAD. 100% proximal LAD, 99% second OM, 70-80% distal RCA. LAD treated with overlapping DES and OM with DES x 1. LV function mildly reduced. Plan DAPT for one year. Echo pending today. Transfer to telemetry today and ambulate with Cardiac Rehab. Anticipate DC in am. If recurrent chest pain would need to consider treating RCA lesion- but if no chest pain will treat medically. 2. DM type 2. Metformin on hold. Continue glipizide. On SSI. Resume metformin at DC 3. HTN controlled. Patient reports he was only taking 25 mg atenolol at home. Will reduce to 12.5 mg given marked bradycardia. 4. Hyperlipidemia. Now on high dose statin. Stop red yeast rice.   Present on Admission:  . Essential hypertension . Hyperlipidemia . NSTEMI (non-ST elevated myocardial infarction) ([place]) . T2_NIDDM w/ CKD  Signed, Arly Salminen [place], [place] 09/28/2015 11:54 AM

## 2015-09-28 NOTE — Progress Notes (Signed)
Patient alert and oriented. Placed on telemetry. No questions or concerns at this time.

## 2015-09-28 NOTE — Progress Notes (Signed)
Utilization review completed.  

## 2015-09-29 ENCOUNTER — Encounter (HOSPITAL_COMMUNITY): Payer: Self-pay | Admitting: Student

## 2015-09-29 ENCOUNTER — Inpatient Hospital Stay (HOSPITAL_COMMUNITY): Payer: PPO

## 2015-09-29 ENCOUNTER — Telehealth: Payer: Self-pay | Admitting: Physician Assistant

## 2015-09-29 DIAGNOSIS — I2510 Atherosclerotic heart disease of native coronary artery without angina pectoris: Secondary | ICD-10-CM

## 2015-09-29 LAB — GLUCOSE, CAPILLARY: GLUCOSE-CAPILLARY: 134 mg/dL — AB (ref 65–99)

## 2015-09-29 LAB — CBC
HCT: 42.7 % (ref 39.0–52.0)
HEMOGLOBIN: 14.1 g/dL (ref 13.0–17.0)
MCH: 29.3 pg (ref 26.0–34.0)
MCHC: 33 g/dL (ref 30.0–36.0)
MCV: 88.8 fL (ref 78.0–100.0)
PLATELETS: 211 10*3/uL (ref 150–400)
RBC: 4.81 MIL/uL (ref 4.22–5.81)
RDW: 13.4 % (ref 11.5–15.5)
WBC: 11.7 10*3/uL — AB (ref 4.0–10.5)

## 2015-09-29 LAB — HEMOGLOBIN A1C
HEMOGLOBIN A1C: 6.3 % — AB (ref 4.8–5.6)
MEAN PLASMA GLUCOSE: 134 mg/dL

## 2015-09-29 MED ORDER — TICAGRELOR 90 MG PO TABS
90.0000 mg | ORAL_TABLET | Freq: Two times a day (BID) | ORAL | Status: DC
Start: 2015-09-29 — End: 2015-10-27

## 2015-09-29 MED ORDER — ATORVASTATIN CALCIUM 80 MG PO TABS: 80.0000 mg | ORAL_TABLET | Freq: Every day | ORAL | Status: DC

## 2015-09-29 MED ORDER — ATENOLOL 25 MG PO TABS: 12.5000 mg | ORAL_TABLET | Freq: Every day | ORAL | Status: DC

## 2015-09-29 MED ORDER — NITROGLYCERIN 0.4 MG SL SUBL: 0.4000 mg | SUBLINGUAL_TABLET | SUBLINGUAL | Status: AC | PRN

## 2015-09-29 MED ORDER — TICAGRELOR 90 MG PO TABS: 90.0000 mg | ORAL_TABLET | Freq: Two times a day (BID) | ORAL | Status: DC

## 2015-09-29 NOTE — Telephone Encounter (Signed)
New problem   Pt has 7day TCM w/Rhonda Barrett 12.29.16 per [place] calling.

## 2015-09-29 NOTE — Progress Notes (Signed)
  Echocardiogram 2D Echocardiogram has been performed.  Daniel Burnett 09/29/2015, 8:48 AM

## 2015-09-29 NOTE — Discharge Instructions (Signed)

## 2015-09-29 NOTE — Care Management Note (Addendum)
Case Management Note Marvetta Gibbons RN, BSN Unit 2W-Case Manager [phone]  Patient Details  Name: Daniel Burnett MRN: [id number] Date of Birth: [birth year]  Subjective/Objective:   Pt admitted with NSTEMI s/p cath                 Action/Plan: PTA pt lived at home with spouse- plan to return home with family- referral for Brilinta needs- insurance check submitted for benefit coverage- spoke with pt and wife at bedside- 30 day free card given for Brilinta- explained that benefit check was in progress- but it they were ready for discharge before into received they could also have their pharmacy check copay info. - discussed importance of med and if cost to much to call MD office to discuss alternate meds before stopping med or running out of med. Also encouraged them to call pt support line. Will f/u with pt if insurance check returns prior to pt leaving.   Expected Discharge Date:     09/29/15             Expected Discharge Plan:  Home/Self Care  In-House Referral:     Discharge planning Services  CM Consult, Medication Assistance  Post Acute Care Choice:    Choice offered to:     DME Arranged:    DME Agency:     HH Arranged:    HH Agency:     Status of Service:  Completed, signed off  Medicare Important Message Given:    Date Medicare IM Given:    Medicare IM give by:    Date Additional Medicare IM Given:    Additional Medicare Important Message give by:     If discussed at [place] of Stay Meetings, dates discussed:    Additional Comments:  09/29/15-1050- insurance check received on Brilinta- Pt copay will be $45- prior auth not required - pt informed.   Dawayne Patricia, RN 09/29/2015, 10:06 AM

## 2015-09-29 NOTE — Progress Notes (Signed)
Pt in stable condition, went over discharge instructions with pt and family they verbalized understanding, iv taken out, cardiac monitor dc, ccmd notified, pt belongings at pt bedside, pt taken off the unit on a wheelchair Oren Beckmann, RN.

## 2015-09-29 NOTE — Discharge Summary (Signed)
CARDIOLOGY DISCHARGE SUMMARY   Patient ID: DEDRICK Burnett MRN: [id number] DOB/AGE: Dec 15, 1945 69 y.o.  Admit date: 09/27/2015 Discharge date: 09/29/2015  PCP: Alesia Richards, MD Primary Cardiologist: New - Dr. Martinique  Primary Discharge Diagnosis:  NSTEMI (non-ST elevated myocardial infarction) [facility])  Secondary Discharge Diagnosis: Essential hypertension, Hyperlipidemia, T2_NIDDM w/ CKD, Atherosclerosis of native coronary artery of native heart with unstable angina pectoris ([place])  Consults: None  Procedures: Left Heart Catheterization, Coronary Angiography, Coronary Stent Intervention, Transthoracic Echocardiogram  Hospital Course: Daniel Burnett is a 69 y.o. male with past medical history of HTN, HLD, DM, GERD, and BPH who presented to [facility] ED on 09/27/2015 for new-onset chest pain. He developed pain that previous night and it was associated with mild nausea. The pain lasted several hours and did not radiate anywhere. His initial troponin was elevated to 7.57 and his EKG showed subtle, diffuse ST depression. The risks and benefits of catheterization were discussed in detail with the patient and he was taken urgently to the catheterization lab.   His cath showed 100% stenosis in the Prox LAD with diffuse distal disease. PCI was successfully performed with 2 overlapping Promus DES. He also had 99% stenosis of the 2nd Mrg and successful PCI was performed with a DES. His RCA had 70% stenosis and will be treated medically for now. He was started on ASA and Brilinta and will need to be on DAPT for one year.  He reported feeling well the following day. Denied any repeat chest pain or other anginal symptoms. His HR was noted to be in the 40's at times so his PTA Atenolol was decreased from 25mg  daily to 12.5mg  daily. He ambulated over 700 ft with cardiac rehab without any symptoms. His troponin peaked at 18.09.  He continued to do well the following morning. His  echocardiogram was obtained prior to discharge. He ambulated 700+ feet with cardiac rehab again without any symptoms. The Case Manager was consulted in regards to his Brilinta prior to discharge.  The patient was last examined by Dr. Martinique and deemed stable for discharge. He will have close follow-up with Rosaria Ferries, PA-C on 10/06/2015. He was given a printed Rx to use with his Brilinta coupon along with annual refills. He was also given an Rx for his newly started statin, new Atenolol dose, and SL NTG to use as needed. Instructed to resume Metformin on 09/30/2015. All questions and concerns were addressed and he was discharged in stable condition.    PHYSICAL EXAM General: Well developed, well nourished, in no acute distress. Head: Normocephalic, atraumatic, sclera non-icteric, oropharynx is clear Neck: Negative for carotid bruits. JVD not elevated. No adenopathy Lungs: Clear bilaterally to auscultation without wheezes, rales, or rhonchi. Breathing is unlabored. Heart: RRR S1 S2 without murmurs, rubs, or gallops.  Abdomen: Soft, non-tender, non-distended with normoactive bowel sounds. No hepatomegaly. No rebound/guarding. No obvious abdominal masses. Msk: Strength and tone appears normal for age. Extremities: No clubbing, cyanosis or edema. Distal pedal pulses are 2+ and equal bilaterally. Right radial site without evidence of a hematoma or bruit. Neuro: Alert and oriented X 3. Moves all extremities spontaneously. Psych: Responds to questions appropriately with a normal affect.   Labs: Lab Results  Component Value Date   WBC 11.7* 09/29/2015   HGB 14.1 09/29/2015   HCT 42.7 09/29/2015   MCV 88.8 09/29/2015   PLT 211 09/29/2015     Recent Labs Lab 09/28/15 0320  NA 137  K 4.0  CL 106  CO2 23  BUN 14  CREATININE 1.12  CALCIUM 9.2  GLUCOSE 131*    Recent Labs  09/27/15 2148 09/28/15 0320 09/28/15 0945  TROPONINI 18.09* 12.96* 9.74*   Lipid Panel     Component  Value Date/Time   CHOL 184 08/26/2015 1014   TRIG 125 08/26/2015 1014   HDL 49 08/26/2015 1014   CHOLHDL 3.8 08/26/2015 1014   VLDL 25 08/26/2015 1014   LDLCALC 110 08/26/2015 1014       Radiology:   Dg Chest 2 View:09/27/2015  CLINICAL DATA:  Chest pain. History of hypertension, GERD and diabetes. Former smoker. EXAM: CHEST  2 VIEW COMPARISON:  None. FINDINGS: There is partial obscuration of the left heart border secondary to age-indeterminate though presumably chronic mild elevation of left hemidiaphragm. Otherwise, normal cardiac silhouette and mediastinal contours. Atherosclerotic plaque within the thoracic aorta. No focal airspace opacities. No pleural effusion or pneumothorax. No evidence of edema. No acute osseus abnormalities. Degenerative change of the right acromioclavicular joint, incompletely evaluated. There is moderate gas distention of the stomach below the left hemidiaphragm. No pneumoperitoneum. IMPRESSION: No acute cardiopulmonary disease. Electronically Signed   By: Sandi Mariscal M.D.   On: 09/27/2015 15:17    Cardiac Cath: 09/27/2015  Prox LAD lesion, 100% stenosed --> followed by Mid LAD to Dist LAD lesion, 65% stenosed.  Successful, Difficult/complex PTCA/PCI - with 2 overlapping Promus DES stents. Post intervention, there is a 0% residual stenosis.  Ost 2nd Diag to 2nd Diag lesion, 90% stenosed. With mild bridging appearance.  2nd Mrg lesion, 99% stenosed. Difficult PCI with Promus DES stent. Post intervention, there is a 0% residual stenosis.  Dist RCA lesion, 70% stenosed. Initial plan is to treat medically  There is mild left ventricular systolic dysfunction.   Successful, difficult PCI of some chronically occluded LAD with diffuse distal disease from the occlusion site. Also successful PCI of subtotally occluded OM 2, despite difficulty with potential wire related dissection.  Existing distal RCA lesion may or may not be physiologically significant. Would  defer and treatment medically now.  Plan: * Admit to step down/TCU, with potential fast-track discharge depending on his symptoms. * Would check 2-D echocardiogram to get a better assessment of EF and wall motion abnormality. * Minimum of one year dual antiplatelet therapy. * Continue aggressive risk factor modification. * Cardiac Rehabilitation Consultation.  EKG: Sinus bradycardia, rate in 50's, 1st degree AV Block, diffuse ST depression.  Echo: 09/29/2015 Study Conclusions - Left ventricle: The cavity size was normal. Wall thickness was normal. Systolic function was normal. The estimated ejection fraction was in the range of 55% to 60%. There is hypokinesis of the basalinferolateral myocardium. Doppler parameters are consistent with abnormal left ventricular relaxation (grade 1 diastolic dysfunction). - Aortic valve: There was mild regurgitation. - Mitral valve: There was mild regurgitation. - Left atrium: The atrium was mildly dilated.  Impressions: - Hypokinesis of the basal inferior lateral wall with overall normal LV function; grade 1 diastolic dysfunction; mild LAE; mld AI, MR and TR.  FOLLOW UP PLANS AND APPOINTMENTS Allergies  Allergen Reactions  . Ppd [Tuberculin Purified Protein Derivative] Other (See Comments)    Positive tb test.  . Tricor [Fenofibrate] Other (See Comments)    GI upset     Medication List    STOP taking these medications        Red Yeast Rice 600 MG Caps      TAKE these medications  aspirin 81 MG tablet  Take 81 mg by mouth daily.     atenolol 25 MG tablet  Commonly known as:  TENORMIN  Take 0.5 tablets (12.5 mg total) by mouth daily.     atorvastatin 80 MG tablet  Commonly known as:  LIPITOR  Take 1 tablet (80 mg total) by mouth daily at 6 PM.     benazepril 40 MG tablet  Commonly known as:  LOTENSIN  TAKE 1 TABLET BY MOUTH EVERY DAY FOR BLOOD PRESSURE     FISH OIL PO  Take 1 capsule by mouth 2 (two)  times daily.     glipiZIDE 5 MG tablet  Commonly known as:  GLUCOTROL  TAKE 1/2 TO 1 TABLET BY MOUTH TWICE DAILY WITH MEALS     glucose blood test strip  Check glucose 1 time daily.     magnesium gluconate 500 MG tablet  Commonly known as:  MAGONATE  Take 500 mg by mouth daily.     metFORMIN 500 MG 24 hr tablet  Commonly known as:  GLUCOPHAGE-XR  TAKE 1-2 TABLETS TWICE A DAY FOR DIABETES  Notes to Patient:  RESUME ON 09/30/2015.     nitroGLYCERIN 0.4 MG SL tablet  Commonly known as:  NITROSTAT  Place 1 tablet (0.4 mg total) under the tongue every 5 (five) minutes x 3 doses as needed for chest pain.     ticagrelor 90 MG Tabs tablet  Commonly known as:  BRILINTA  Take 1 tablet (90 mg total) by mouth 2 (two) times daily.     Vitamin D 2000 UNITS Caps  Take 2,000 Units by mouth 2 (two) times daily.        Discharge Instructions    Amb Referral to Cardiac Rehabilitation    Complete by:  As directed   Diagnosis:   Myocardial Infarction PCI            Follow-up Information    Follow up with [facility] On 10/06/2015.   Specialty:  Cardiology   Why:  Cardiology Hospital Follow-Up on 10/06/2015 at 2:30PM with Rosaria Ferries, PA-C.   Contact information:   [facility] [place] [phone]      BRING ALL MEDICATIONS WITH YOU TO FOLLOW UP APPOINTMENTS  Time spent with patient to include physician time: 40 minutes  Signed: Erma Heritage, PA 09/29/2015, 11:14 AM Co-Sign MD

## 2015-09-29 NOTE — Progress Notes (Signed)
CARDIAC REHAB PHASE I   PRE:  Rate/Rhythm: 71 SR  BP:  Supine: 128/63  Sitting:   Standing:    SaO2:   MODE:  Ambulation: 750 ft   POST:  Rate/Rhythm: 93 SR  BP:  Supine:   Sitting: 131/63  Standing:    SaO2: 98%RA 0902-0930 Pt walked 750 ft with steady gait. No CP. Tolerated well. Will have case manager see re brilinta before discharge.   Graylon Good, RN BSN  09/29/2015 9:25 AM

## 2015-09-29 NOTE — Telephone Encounter (Signed)
Patient contacted regarding discharge from [facility] on 09/29/15.  Patient understands to follow up with provider Rosaria Ferries on 10/06/15 at 2:30 pm at [address]. Patient understands discharge instructions? yes  Patient understands medications and regiment? yes  Patient understands to bring all medications to this visit? yes

## 2015-10-06 ENCOUNTER — Ambulatory Visit (INDEPENDENT_AMBULATORY_CARE_PROVIDER_SITE_OTHER): Payer: PPO | Admitting: Physician Assistant

## 2015-10-06 ENCOUNTER — Encounter: Payer: Self-pay | Admitting: Physician Assistant

## 2015-10-06 VITALS — BP 140/74 | HR 64 | Ht 70.0 in | Wt 186.5 lb

## 2015-10-06 DIAGNOSIS — I214 Non-ST elevation (NSTEMI) myocardial infarction: Secondary | ICD-10-CM | POA: Diagnosis not present

## 2015-10-06 DIAGNOSIS — K30 Functional dyspepsia: Secondary | ICD-10-CM

## 2015-10-06 DIAGNOSIS — I25110 Atherosclerotic heart disease of native coronary artery with unstable angina pectoris: Secondary | ICD-10-CM

## 2015-10-06 MED ORDER — ROSUVASTATIN CALCIUM 20 MG PO TABS: 20.0000 mg | ORAL_TABLET | Freq: Every day | ORAL | Status: DC

## 2015-10-06 NOTE — Patient Instructions (Signed)
Medication Instructions:  Your physician has recommended you make the following change in your medication:  1- STOP Lipitor  2- START Crestor 20 mg by mouth at night time start on day 10/10/2015 3- TAKE Mylanta as needed for gas with meals, you can get this over the counter  Labwork: NONE  Testing/Procedures: NONE  Follow-Up: Your physician wants you to follow-up in: 3 months with Dr. Martinique  Your physician wants you to call your GI doctor and/or your primary care physician if your GI issues do not improve.  Your physician wants you to increase your activity. Per rehab, No lifting over 50 pounds for 6 weeks.  Your physician wants you to be referred to cardiac rehab, they will call you with an appointment.     If you need a refill on your cardiac medications before your next appointment, please call your pharmacy.

## 2015-10-06 NOTE — Progress Notes (Signed)
Cardiology Office Note   Date:  10/06/2015   ID:  Daniel Burnett, DOB 01-13-1946, MRN [id number]  PCP:  Alesia Richards, MD  Cardiologist:  Dr. Martinique  Ethon Wymer, PA-C   Chief Complaint  Patient presents with  . Follow-up    post ED/CATH with Coronary Stent Intervention (3 stents)//pt c/o chest pain/discomfort, has had mostly today since this morning (pt states with all the medication, his stomach stays full of gas)    History of Present Illness: Daniel Burnett is a 69 y.o. male with a history of HTN, HL, NIDDM, CKD III, BPH, d/c 12/22 after NSTEMI w/ DES x 2 LAD & DES OM2. Med rx for 70% RCA, EF 55-60% by echo.  Daniel Burnett presents for Post hospital follow-up  Since discharge from the hospital, he has struggled. He has had significant GI issues which are not new for him but are worse since being hospitalized with multiple med changes. He does not feel like eating. He feels bloated all the time. He had diarrhea after discharge that lasted several days and finally resolved. He has not had diarrhea and 48 hours, but he has also not had a bowel movement yet. For his stomach, prior to admission, he was taking probiotics which he felt helped.  He had one episode of chest pain today that resolved with belching and was a 1/10. It did not remind him of his pre-PCI symptoms.  He was on atenolol prior to admission and the dose was decreased because his heart rate was in the 40s at time. The Lipitor is new and he wonders if that is part of the problem because his brother did not tolerate it. The Lotensin and diabetes medications are unchanged. He was taking red [place] rice which was discontinued. The Brilinta is the other new medication. He has not had shortness of breath or nausea and vomiting. He has not had chest pain.  He is increasing his activity slowly. He is walking about 10 minutes at a time. He is reluctant to go to cardiac rehabilitation because of the time involved  and he is concerned about the co-pay. He is willing to increase his activity. 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   Past Medical History  Diagnosis Date  . Hyperlipidemia   . Hypertension   . Diabetes mellitus without complication ([name])   . Vitamin D deficiency   . GERD (gastroesophageal reflux disease)   . ED (erectile dysfunction)   . Colitis   . BPH (benign prostatic hyperplasia)   . Kidney stone   . CAD (coronary artery disease)     a. Cath 09/2015: 100% stenosis Prox LAD --> 2 DES placed, 90% D2, 99% 2nd Mrg --> DES placed, 70% RCA --> Medically manage    Past Surgical History  Procedure Laterality Date  . Cystoscopy w/ retrogrades  1991    stone extractions  . Flexible sigmoidoscopy  1993    negative  . Squamous cell carcinoma excision  2001    bridge of nose  . Cardiac catheterization N/A 09/27/2015    Procedure: Left Heart Cath and Coronary Angiography;  Surgeon: Leonie Man, MD;  Location: [place] [facility];  Service: Cardiovascular;  Laterality: N/A;  . Cardiac catheterization N/A 09/27/2015    Procedure: Coronary Stent Intervention;  Surgeon: Leonie Man, MD;  Location: [place] [facility];  Service: Cardiovascular;  Laterality: N/A;  Lad/OM2    Current Outpatient Prescriptions  Medication Sig Dispense Refill  . aspirin 81 MG tablet Take 81 mg by mouth daily.    Marland Kitchen atenolol (TENORMIN) 25 MG tablet Take 0.5 tablets (12.5 mg total) by mouth daily. 30 tablet 6  . benazepril (LOTENSIN) 40 MG tablet TAKE 1 TABLET BY MOUTH EVERY DAY FOR BLOOD PRESSURE 90 tablet 3  . Cholecalciferol (VITAMIN D) 2000 UNITS CAPS Take 2,000 Units by mouth 2 (two) times daily.     Marland Kitchen glipiZIDE (GLUCOTROL) 5 MG tablet TAKE 1/2 TO 1 TABLET BY MOUTH TWICE DAILY WITH MEALS (Patient taking differently: TAKE 1/2 TABLET BY MOUTH TWICE DAILY WITH MEALS) 180  tablet 1  . glucose blood test strip Check glucose 1 time daily. 100 each 12  . magnesium gluconate (MAGONATE) 500 MG tablet Take 500 mg by mouth daily.    . metFORMIN (GLUCOPHAGE-XR) 500 MG 24 hr tablet TAKE 1-2 TABLETS TWICE A DAY FOR DIABETES (Patient taking differently: TAKE 1 TABLET TWICE A DAY FOR DIABETES) 360 tablet 1  . nitroGLYCERIN (NITROSTAT) 0.4 MG SL tablet Place 1 tablet (0.4 mg total) under the tongue every 5 (five) minutes x 3 doses as needed for chest pain. 25 tablet 2  . Omega-3 Fatty Acids (FISH OIL PO) Take 1 capsule by mouth 2 (two) times daily.     . ticagrelor (BRILINTA) 90 MG TABS tablet Take 1 tablet (90 mg total) by mouth 2 (two) times daily. 60 tablet 10  . rosuvastatin (CRESTOR) 20 MG tablet Take 1 tablet (20 mg total) by mouth daily. 90 tablet 3   No current facility-administered medications for this visit.    Allergies:   Ppd and Tricor    Social History:  The patient  reports that he quit smoking about 53 years ago. He does not have any smokeless tobacco history on file. He reports that he does not drink alcohol or use illicit drugs.   Family History:  The patient's family history includes Cancer in his cousin; Diabetes in his maternal aunt; Heart attack in his father and mother; Heart disease in his brother, father, and mother; Hypertension in his brother; Leukemia in his cousin; Stroke in his brother.    ROS:  Please see the history of present illness. All other systems are reviewed and negative.    PHYSICAL EXAM: VS:  BP 140/74 mmHg  Pulse 64  Ht 5\' 10"  (1.778 m)  Wt 186 lb 8 oz (84.596 kg)  BMI 26.76 kg/m2 , BMI Body mass index is 26.76 kg/(m^2). GEN: Well nourished, well developed, male in no acute distress HEENT: normal for age  Neck: no JVD, no carotid bruit, no masses Cardiac: RRR; no murmur, no rubs, or gallops Respiratory:  clear to auscultation bilaterally, normal work of breathing GI: soft, nontender, nondistended, + BS MS: no deformity  or atrophy; no edema; distal pulses are 2+ in all 4 extremities; right radial cath site is healing well and ecchymosis is resolving. Distal pulses 2+.  Skin: warm and dry, no rash Neuro:  Strength and sensation are intact Psych: euthymic mood, full affect   EKG:  EKG is ordered today. The ekg ordered today demonstrates sinus rhythm with minimal voltage criteria for LVH, minor change from 09/27/2015 ECG   Recent Labs: 08/26/2015: ALT 17; Magnesium 2.0 09/27/2015: TSH 2.993 09/28/2015: BUN 14; Creatinine, Ser 1.12; Potassium 4.0; Sodium 137 09/29/2015: Hemoglobin  14.1; Platelets 211    Lipid Panel    Component Value Date/Time   CHOL 184 08/26/2015 1014   TRIG 125 08/26/2015 1014   HDL 49 08/26/2015 1014   CHOLHDL 3.8 08/26/2015 1014   VLDL 25 08/26/2015 1014   LDLCALC 110 08/26/2015 1014     Wt Readings from Last 3 Encounters:  10/06/15 186 lb 8 oz (84.596 kg)  09/29/15 190 lb 6.4 oz (86.365 kg)  08/26/15 191 lb (86.637 kg)     Other studies Reviewed: Additional studies/ records that were reviewed today include: Hospital records, ECGs.  ASSESSMENT AND PLAN:  1.  Non-STEMI: he is on appropriate therapy with aspirin, beta blocker, ACE inhibitor and statin. They requested change from Lipitor to Crestor we will do this.  2. GI issues: He is eating poorly and is lost 4 pounds since discharge from the hospital. I'm concerned that his GI issues are being worsened by new medications. We will change the Lipitor to Crestor at his request. We may have to think about changing the Brilinta if his GI issues do not improve.  Current medicines are reviewed at length with the patient today.  The patient has concerns regarding medicines.CONCERNS were addressed   The following changes have been made:  DC Lipitor, add Crestor  Labs/ tests ordered today include:   Orders Placed This Encounter  Procedures  . EKG 12-Lead    Disposition:   FU with Dr. Martinique  Signed, Lenoard Aden  10/06/2015 3:36 PM    [address], [place], [place]  [address] Phone: [phone]; Fax: [phone]

## 2015-10-21 ENCOUNTER — Other Ambulatory Visit: Payer: Self-pay | Admitting: Internal Medicine

## 2015-10-24 ENCOUNTER — Encounter: Payer: Self-pay | Admitting: Internal Medicine

## 2015-10-24 ENCOUNTER — Ambulatory Visit (INDEPENDENT_AMBULATORY_CARE_PROVIDER_SITE_OTHER): Payer: PPO | Admitting: Internal Medicine

## 2015-10-24 VITALS — BP 132/76 | HR 72 | Temp 97.7°F | Resp 16 | Ht 68.5 in | Wt 187.0 lb

## 2015-10-24 DIAGNOSIS — K219 Gastro-esophageal reflux disease without esophagitis: Secondary | ICD-10-CM | POA: Diagnosis not present

## 2015-10-24 DIAGNOSIS — E785 Hyperlipidemia, unspecified: Secondary | ICD-10-CM | POA: Diagnosis not present

## 2015-10-24 MED ORDER — RANITIDINE HCL 300 MG PO TABS: ORAL_TABLET | ORAL | Status: DC

## 2015-10-24 NOTE — Progress Notes (Signed)
  Subjective:    Patient ID: Daniel Burnett, male    DOB: 12/28/1945, 70 y.o.   MRN: [id number]  HPI  Patient presents with recent sx's of post-prandial EG burning & bloating and mid sx's of reflux. No suspect cardiac sx's. Denies dysphagia/odonophagia or lower GI sx's as diarrhea or constipation. He has not tried any OTC's for these sx's.   Medication Sig  . aspirin 81 MG tablet Take 81 mg by mouth daily.  Marland Kitchen atenolol  25 MG tablet Take 0.5 tablets (12.5 mg total) by mouth daily.  . benazepril  40 MG tablet TAKE 1 TABLET BY MOUTH EVERY DAY FOR BLOOD PRESSURE  . VITAMIN D 2000 UNITS CAPS Take 2,000 Units by mouth 2 times daily.   Marland Kitchen glipiZIDE  5 MG tablet TAKE 1/2 TABLET BY MOUTH TWICE DAILY WITH MEALS  . magnesium  500 MG tablet Take 500 mg by mouth daily.  . metFORMIN -XR 500 MG  TAKE 1 TABLET TWICE A DAY FOR DIABETES  . NITROSTAT 0.4 MG SL  Place 1 tablet (0.4 mg total) under the tongue every 5 (five) minutes   . Omega-3 FISH OIL Take 1 capsule by mouth 2 (two) times daily.   . rosuvastatin (CRESTOR) 20 MG tablet Take 1 tablet (20 mg total) by mouth daily.  . ticagrelor (BRILINTA) 90 MG TABS tablet Take 1 tablet (90 mg total) by mouth 2 (two) times daily.   No facility-administered medications prior to visit.   Allergies  Allergen Reactions  . Ppd [Tuberculin Purified Protein Derivative] Other (See Comments)    Positive tb test.  . Tricor [Fenofibrate] Other (See Comments)    GI upset   Past Medical History  Diagnosis Date  . Hyperlipidemia   . Hypertension   . Diabetes mellitus without complication ([place])   . Vitamin D deficiency   . GERD (gastroesophageal reflux disease)   . ED (erectile dysfunction)   . Colitis   . BPH (benign prostatic hyperplasia)   . Kidney stone   . CAD (coronary artery disease)     a. Cath 09/2015: 100% stenosis Prox LAD --> 2 DES placed, 90% D2, 99% 2nd Mrg --> DES placed, 70% RCA --> Medically manage   Review of Systems  10 point systems review  negative except as above.    Objective:   Physical Exam  BP 132/76 mmHg  Pulse 72  Temp(Src) 97.7 F (36.5 C)  Resp 16  Ht 5' 8.5" (1.74 m)  Wt 187 lb (84.823 kg)  BMI 28.02 kg/m2  HEENT - Eac's patent. TM's Nl. EOM's full. PERRLA. NasoOroPharynx clear. Neck - supple. Nl Thyroid. Carotids 2+ & No bruits, nodes, JVD Chest - Clear equal BS w/o Rales, rhonchi, wheezes. Cor - Nl HS. RRR w/o sig MGR. PP 1(+). No edema. Abd - No palpable organomegaly, masses or tenderness. BS nl. MS- FROM w/o deformities. Muscle power, tone and bulk Nl. Gait Nl. Neuro - No obvious Cr N abnormalities. Sensory, motor and Cerebellar functions appear Nl w/o focal abnormalities. Psyche - Mental status normal & appropriate.  No delusions, ideations or obvious mood abnormalities.    Assessment & Plan:   1. Gastroesophageal reflux disease, esophagitis presence not specified  - ranitidine (ZANTAC) 300 MG tablet; Take 1 to 2 tablets daily for heartburn & reflux  Dispense: 180 tablet; Refill: 3 - Diet discussed

## 2015-10-24 NOTE — Patient Instructions (Signed)
Gastroesophageal Reflux Disease, Adult Normally, food travels down the esophagus and stays in the stomach to be digested. However, when a person has gastroesophageal reflux disease (GERD), food and stomach acid move back up into the esophagus. When this happens, the esophagus becomes sore and inflamed. Over time, GERD can create small holes (ulcers) in the lining of the esophagus.  CAUSES This condition is caused by a problem with the muscle between the esophagus and the stomach (lower esophageal sphincter, or LES). Normally, the LES muscle closes after food passes through the esophagus to the stomach. When the LES is weakened or abnormal, it does not close properly, and that allows food and stomach acid to go back up into the esophagus. The LES can be weakened by certain dietary substances, medicines, and medical conditions, including:  Tobacco use.  Pregnancy.  Having a hiatal hernia.  Heavy alcohol use.  Certain foods and beverages, such as coffee, chocolate, onions, and peppermint. RISK FACTORS This condition is more likely to develop in:  People who have an increased body weight.  People who have connective tissue disorders.  People who use NSAID medicines. SYMPTOMS Symptoms of this condition include:  Heartburn.  Difficult or painful swallowing.  The feeling of having a lump in the throat.  Abitter taste in the mouth.  Bad breath.  Having a large amount of saliva.  Having an upset or bloated stomach.  Belching.  Chest pain.  Shortness of breath or wheezing.  Ongoing (chronic) cough or a night-time cough.  Wearing away of tooth enamel.  Weight loss. Different conditions can cause chest pain. Make sure to see your health care provider if you experience chest pain. DIAGNOSIS Your health care provider will take a medical history and perform a physical exam. To determine if you have mild or severe GERD, your health care provider may also monitor how you respond  to treatment. You may also have other tests, including:  An endoscopy toexamine your stomach and esophagus with a small camera.  A test thatmeasures the acidity level in your esophagus.  A test thatmeasures how much pressure is on your esophagus.  A barium swallow or modified barium swallow to show the shape, size, and functioning of your esophagus. TREATMENT The goal of treatment is to help relieve your symptoms and to prevent complications. Treatment for this condition may vary depending on how severe your symptoms are. Your health care provider may recommend:  Changes to your diet.  Medicine.  Surgery. HOME CARE INSTRUCTIONS Diet  Follow a diet as recommended by your health care provider. This may involve avoiding foods and drinks such as:  Coffee and tea (with or without caffeine).  Drinks that containalcohol.  Energy drinks and sports drinks.  Carbonated drinks or sodas.  Chocolate and cocoa.  Peppermint and mint flavorings.  Garlic and onions.  Horseradish.  Spicy and acidic foods, including peppers, chili powder, curry powder, vinegar, hot sauces, and barbecue sauce.  Citrus fruit juices and citrus fruits, such as Banannas  Tomato-based foods, such as red sauce, chili, salsa, and pizza with red sauce.  Fried and fatty foods, such as donuts, french fries, potato chips, and high-fat dressings.  High-fat meats, such as hot dogs and fatty cuts of red and white meats, such as rib eye steak, sausage, ham, and bacon.  High-fat dairy items, such as whole milk, butter, and cream cheese.  Eat small, frequent meals instead of large meals.  Avoid drinking large amounts of liquid with your meals.  Avoid  eating meals during the 2-3 hours before bedtime.  Avoid lying down right after you eat.  Do not exercise right after you eat. General Instructions  Pay attention to any changes in your symptoms.  Take over-the-counter and prescription medicines only as  told by your health care provider. Do not take aspirin, ibuprofen, or other NSAIDs unless your health care provider told you to do so.  Do not use any tobacco products, including cigarettes, chewing tobacco, and e-cigarettes. If you need help quitting, ask your health care provider.  Wear loose-fitting clothing. Do not wear anything tight around your waist that causes pressure on your abdomen.  Raise (elevate) the head of your bed 6 inches (15cm).  Try to reduce your stress, such as with yoga or meditation. If you need help reducing stress, ask your health care provider.  If you are overweight, reduce your weight to an amount that is healthy for you. Ask your health care provider for guidance about a safe weight loss goal.  Keep all follow-up visits as told by your health care provider. This is important. SEEK MEDICAL CARE IF:  You have new symptoms.  You have unexplained weight loss.  You have difficulty swallowing, or it hurts to swallow.  You have wheezing or a persistent cough.  Your symptoms do not improve with treatment.  You have a hoarse voice. SEEK IMMEDIATE MEDICAL CARE IF:  You have pain in your arms, neck, jaw, teeth, or back.  You feel sweaty, dizzy, or light-headed.  You have chest pain or shortness of breath.  You vomit and your vomit looks like blood or coffee grounds.  You faint.  Your stool is bloody or black.  You cannot swallow, drink, or eat.   +++++++++++++++++++++++++++++++++++++++++++++++++++++++++++++++++++++++++++++++++++++++  Food Choices for Gastroesophageal Reflux Disease, Adult When you have gastroesophageal reflux disease (GERD), the foods you eat and your eating habits are very important. Choosing the right foods can help ease the discomfort of GERD. WHAT GENERAL GUIDELINES DO I NEED TO FOLLOW?  Choose fruits, vegetables, whole grains, low-fat dairy products, and low-fat meat, fish, and poultry.  Limit fats such as oils, salad  dressings, butter, nuts, and avocado.  Keep a food diary to identify foods that cause symptoms.  Avoid foods that cause reflux. These may be different for different people.  Eat frequent small meals instead of three large meals each day.  Eat your meals slowly, in a relaxed setting.  Limit fried foods.  Cook foods using methods other than frying.  Avoid drinking alcohol.  Avoid drinking large amounts of liquids with your meals.  Avoid bending over or lying down until 2-3 hours after eating. WHAT FOODS ARE NOT RECOMMENDED? The following are some foods and drinks that may worsen your symptoms: Vegetables Tomatoes. Tomato juice. Tomato and spaghetti sauce. Chili peppers. Onion and garlic. Horseradish. Fruits Oranges, grapefruit, and lemon (fruit and juice). Meats High-fat meats, fish, and poultry. This includes hot dogs, ribs, ham, sausage, salami, and bacon. Dairy Whole milk and chocolate milk. Sour cream. Cream. Butter. Ice cream. Cream cheese.  Beverages Coffee and tea, with or without caffeine. Carbonated beverages or energy drinks. Condiments Hot sauce. Barbecue sauce.  Sweets/Desserts Chocolate and cocoa. Donuts. Peppermint and spearmint. Fats and Oils High-fat foods, including [place] fries and potato chips. Other Vinegar. Strong spices, such as black pepper, white pepper, red pepper, cayenne, curry powder, cloves, ginger, and chili powder. The items listed above may not be a complete list of foods and beverages to avoid. Contact  your dietitian for more information.   This information is not intended to replace advice given to you by your health care provider. Make sure you discuss any questions you have with your health care provider.   Document Released: 09/24/2005 Document Revised: 10/15/2014 Document Reviewed: 07/29/2013 Elsevier Interactive Patient Education [employer].

## 2015-10-27 ENCOUNTER — Other Ambulatory Visit: Payer: Self-pay | Admitting: Student

## 2015-10-27 NOTE — Telephone Encounter (Signed)
Rx request sent to pharmacy.  

## 2015-10-28 ENCOUNTER — Other Ambulatory Visit: Payer: Self-pay

## 2015-10-28 MED ORDER — TICAGRELOR 90 MG PO TABS: 90.0000 mg | ORAL_TABLET | Freq: Two times a day (BID) | ORAL | Status: DC

## 2015-10-28 NOTE — Telephone Encounter (Signed)
Rx(s) sent to pharmacy electronically.  

## 2015-11-01 ENCOUNTER — Encounter: Payer: Self-pay | Admitting: Internal Medicine

## 2015-12-02 ENCOUNTER — Ambulatory Visit: Payer: Self-pay | Admitting: Internal Medicine

## 2015-12-09 ENCOUNTER — Ambulatory Visit (INDEPENDENT_AMBULATORY_CARE_PROVIDER_SITE_OTHER): Payer: PPO | Admitting: Internal Medicine

## 2015-12-09 ENCOUNTER — Encounter: Payer: Self-pay | Admitting: Internal Medicine

## 2015-12-09 VITALS — BP 126/64 | HR 52 | Temp 97.5°F | Resp 16 | Ht 68.5 in | Wt 183.2 lb

## 2015-12-09 DIAGNOSIS — Z125 Encounter for screening for malignant neoplasm of prostate: Secondary | ICD-10-CM | POA: Diagnosis not present

## 2015-12-09 DIAGNOSIS — Z0001 Encounter for general adult medical examination with abnormal findings: Secondary | ICD-10-CM

## 2015-12-09 DIAGNOSIS — R972 Elevated prostate specific antigen [PSA]: Secondary | ICD-10-CM | POA: Diagnosis not present

## 2015-12-09 DIAGNOSIS — E1121 Type 2 diabetes mellitus with diabetic nephropathy: Secondary | ICD-10-CM

## 2015-12-09 DIAGNOSIS — I2510 Atherosclerotic heart disease of native coronary artery without angina pectoris: Secondary | ICD-10-CM | POA: Diagnosis not present

## 2015-12-09 DIAGNOSIS — E1129 Type 2 diabetes mellitus with other diabetic kidney complication: Secondary | ICD-10-CM | POA: Diagnosis not present

## 2015-12-09 DIAGNOSIS — I10 Essential (primary) hypertension: Secondary | ICD-10-CM

## 2015-12-09 DIAGNOSIS — Z79899 Other long term (current) drug therapy: Secondary | ICD-10-CM

## 2015-12-09 DIAGNOSIS — E782 Mixed hyperlipidemia: Secondary | ICD-10-CM | POA: Diagnosis not present

## 2015-12-09 DIAGNOSIS — Z1212 Encounter for screening for malignant neoplasm of rectum: Secondary | ICD-10-CM

## 2015-12-09 DIAGNOSIS — E559 Vitamin D deficiency, unspecified: Secondary | ICD-10-CM | POA: Diagnosis not present

## 2015-12-09 DIAGNOSIS — R6889 Other general symptoms and signs: Secondary | ICD-10-CM

## 2015-12-09 DIAGNOSIS — K219 Gastro-esophageal reflux disease without esophagitis: Secondary | ICD-10-CM | POA: Diagnosis not present

## 2015-12-09 DIAGNOSIS — N320 Bladder-neck obstruction: Secondary | ICD-10-CM | POA: Diagnosis not present

## 2015-12-09 LAB — URINALYSIS, MICROSCOPIC ONLY
Bacteria, UA: NONE SEEN [HPF]
CASTS: NONE SEEN [LPF]
Crystals: NONE SEEN [HPF]
SQUAMOUS EPITHELIAL / LPF: NONE SEEN [HPF] (ref <=5)
YEAST: NONE SEEN [HPF]

## 2015-12-09 LAB — HEPATIC FUNCTION PANEL
ALBUMIN: 4.3 g/dL (ref 3.6–5.1)
ALK PHOS: 67 U/L (ref 40–115)
ALT: 25 U/L (ref 9–46)
AST: 21 U/L (ref 10–35)
BILIRUBIN INDIRECT: 0.4 mg/dL (ref 0.2–1.2)
Bilirubin, Direct: 0.2 mg/dL (ref <=0.2)
TOTAL PROTEIN: 6.5 g/dL (ref 6.1–8.1)
Total Bilirubin: 0.6 mg/dL (ref 0.2–1.2)

## 2015-12-09 LAB — CBC WITH DIFFERENTIAL/PLATELET
Basophils Absolute: 0.1 10*3/uL (ref 0.0–0.1)
Basophils Relative: 1 % (ref 0–1)
Eosinophils Absolute: 0.5 10*3/uL (ref 0.0–0.7)
Eosinophils Relative: 5 % (ref 0–5)
HEMATOCRIT: 36.8 % — AB (ref 39.0–52.0)
HEMOGLOBIN: 12.1 g/dL — AB (ref 13.0–17.0)
LYMPHS PCT: 15 % (ref 12–46)
Lymphs Abs: 1.6 10*3/uL (ref 0.7–4.0)
MCH: 28.9 pg (ref 26.0–34.0)
MCHC: 32.9 g/dL (ref 30.0–36.0)
MCV: 88 fL (ref 78.0–100.0)
MONO ABS: 0.7 10*3/uL (ref 0.1–1.0)
MPV: 9.7 fL (ref 8.6–12.4)
Monocytes Relative: 7 % (ref 3–12)
NEUTROS ABS: 7.6 10*3/uL (ref 1.7–7.7)
NEUTROS PCT: 72 % (ref 43–77)
Platelets: 338 10*3/uL (ref 150–400)
RBC: 4.18 MIL/uL — AB (ref 4.22–5.81)
RDW: 15.5 % (ref 11.5–15.5)
WBC: 10.5 10*3/uL (ref 4.0–10.5)

## 2015-12-09 LAB — URINALYSIS, ROUTINE W REFLEX MICROSCOPIC
BILIRUBIN URINE: NEGATIVE
GLUCOSE, UA: NEGATIVE
KETONES UR: NEGATIVE
Leukocytes, UA: NEGATIVE
Nitrite: NEGATIVE
Specific Gravity, Urine: 1.021 (ref 1.001–1.035)
pH: 5.5 (ref 5.0–8.0)

## 2015-12-09 LAB — LIPID PANEL
CHOLESTEROL: 87 mg/dL — AB (ref 125–200)
HDL: 54 mg/dL (ref >=40)
LDL Cholesterol: 9 mg/dL (ref <130)
Total CHOL/HDL Ratio: 1.6 Ratio (ref <=5.0)
Triglycerides: 122 mg/dL (ref <150)
VLDL: 24 mg/dL (ref <30)

## 2015-12-09 LAB — HEMOGLOBIN A1C
HEMOGLOBIN A1C: 6 % — AB (ref <5.7)
MEAN PLASMA GLUCOSE: 126 mg/dL — AB (ref <117)

## 2015-12-09 LAB — TSH: TSH: 1.86 m[IU]/L (ref 0.40–4.50)

## 2015-12-09 LAB — BASIC METABOLIC PANEL WITH GFR
BUN: 23 mg/dL (ref 7–25)
CALCIUM: 9.8 mg/dL (ref 8.6–10.3)
CHLORIDE: 103 mmol/L (ref 98–110)
CO2: 28 mmol/L (ref 20–31)
Creat: 1.16 mg/dL (ref 0.70–1.18)
GFR, EST NON AFRICAN AMERICAN: 63 mL/min (ref >=60)
GFR, Est African American: 73 mL/min (ref >=60)
GLUCOSE: 97 mg/dL (ref 65–99)
POTASSIUM: 4.7 mmol/L (ref 3.5–5.3)
Sodium: 138 mmol/L (ref 135–146)

## 2015-12-09 LAB — MAGNESIUM: MAGNESIUM: 1.9 mg/dL (ref 1.5–2.5)

## 2015-12-09 NOTE — Progress Notes (Signed)
Patient ID: Daniel Burnett, male   DOB: 02/21/1946, 70 y.o.   MRN: [id number]  Annual  Preventative Visit And Comprehensive Evaluation & Examination     This very nice 70 y.o. MWM presents for a Preventative Visit & comprehensive evaluation. Patient has HTN predating from 70. In Dec 2016, he presented with an acute NSTEMI and had PTCA with #3 DES by Dr Ellyn Hack. Patient has done well since then and denies any cardiac symptoms as chest pain, palpitations, shortness of breath, dizziness or ankle swelling. Patient's primary cardiologist for f/u is Dr Martinique.      Patient's hyperlipidemia is controlled with diet and medications. Patient denies myalgias or other medication SE's. Last lipids were  Cholesterol 184; HDL 49; LDL 110; Triglycerides 125 on 08/26/2015 and patient was switched during the Dec hospitalization from Atorvastatin to Rosuvastatin.      Patient has T2_NIDDM since 2002 and patient denies reactive hypoglycemic symptoms, visual blurring, diabetic polys or paresthesias.  He reports daily FBG's range about 100 mg%. Last A1c was 6.3% on 09/28/2015.      Finally, patient has history of Vitamin D Deficiency of "73" in 2008 and last vitamin D was 92 on 08/26/2015.   Medication Sig  . aspirin 81 MG tablet Take 81 mg by mouth daily.  Marland Kitchen atenolol ( 25 MG Take 0.5 tablets (12.5 mg total) by mouth daily.  Marland Kitchen atorvastatin ( 80 MG tablet TAKE 1 TABLET (80 MG TOTAL) BY MOUTH DAILY AT 6 PM.  . benazepril 40 MG tablet TAKE 1 TABLET BY MOUTH EVERY DAY FOR BLOOD PRESSURE  . VITAMIN D 2000 UNITS  Take 2,000 Units by mouth 2 (two) times daily.   Marland Kitchen glipiZIDE  5 MG tablet Patient taking differently: TAKE 1/2 TABLET BY MOUTH TWICE DAILY WITH MEALS  . magnesium  500 MG Take 500 mg by mouth daily.  . metFORMIN-XR 500 MG  Patient taking differently: TAKE 1 TABLET TWICE A DAY FOR DIABETES  . NITROSTAT 0.4 MG SL  As needed for chest pain.  . Omega-3 FISH OIL Take 1 capsule by mouth 2 (two) times daily.   .  ranitidine 300 MG Take 1 to 2 tablets daily for heartburn & reflux  . rosuvastatin  20 MG  Take 1 tablet (20 mg total) by mouth daily.  . ticagrelor (BRILINTA) 90 MG  Take 1 tablet (90 mg total) by mouth 2 (two) times daily.   Allergies  Allergen Reactions  . Ppd [Tuberculin Purified Protein Derivative] Other (See Comments)    Positive tb test.  . Tricor [Fenofibrate] Other (See Comments)    GI upset   Past Medical History  Diagnosis Date  . Hyperlipidemia   . Hypertension   . Diabetes mellitus without complication ([place])   . Vitamin D deficiency   . GERD (gastroesophageal reflux disease)   . ED (erectile dysfunction)   . Colitis   . BPH (benign prostatic hyperplasia)   . Kidney stone   . CAD (coronary artery disease)     a. Cath 09/2015: 100% stenosis Prox LAD --> 2 DES placed, 90% D2, 99% 2nd Mrg --> DES placed, 70% RCA --> Medically manage   Health Maintenance  Topic Date Due  . Hepatitis C Screening  07-Oct-1946  . TETANUS/TDAP  08/07/1963  . ZOSTAVAX  08/06/2004  . PNA vac Low Risk Adult (2 of 2 - PCV13) 09/18/2013  . INFLUENZA VACCINE  05/09/2015  . FOOT EXAM  10/21/2015  . HEMOGLOBIN A1C  03/28/2016  .  OPHTHALMOLOGY EXAM  05/03/2016  . COLONOSCOPY  06/08/2017   Immunization History  Administered Date(s) Administered  . DT 02/09/2014  . DTaP 01/06/2005  . Influenza Whole 09/18/2012  . Pneumococcal Polysaccharide-23 09/18/2012   Past Surgical History  Procedure Laterality Date  . Cystoscopy w/ retrogrades  1991    stone extractions  . Flexible sigmoidoscopy  1993    negative  . Squamous cell carcinoma excision  2001    bridge of nose  . Cardiac catheterization N/A 09/27/2015    Procedure: Left Heart Cath and Coronary Angiography;  Surgeon: Leonie Man, MD;  Location: [facility];  Service: Cardiovascular;  Laterality: N/A;  . Cardiac catheterization N/A 09/27/2015    Procedure: Coronary Stent Intervention;  Surgeon: Leonie Man, MD;  Location:  [facility];  Service: Cardiovascular;  Laterality: N/A;  Lad/OM2   Family History  Problem Relation Age of Onset  . Heart disease Mother   . Heart attack Mother   . Heart disease Father   . Heart attack Father   . Diabetes Maternal Aunt   . Heart disease Brother   . Stroke Brother   . Hypertension Brother   . Cancer Cousin   . Leukemia Cousin     Social History   Social History  . Marital Status: Married    Spouse Name: N/A  . Number of Children: N/A  . Years of Education: N/A   Occupational History  . 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   Social History Main Topics  . Smoking status: Former Smoker    Quit date: 10/08/1962  . Smokeless tobacco: Not on file  . Alcohol Use: No  . Drug Use: No  . Sexual Activity: Not on file    ROS Constitutional: Denies fever, chills, weight loss/gain, headaches, insomnia,  night sweats or change in appetite. Does c/o fatigue. Eyes: Denies redness, blurred vision, diplopia, discharge, itchy or watery eyes.  ENT: Denies discharge, congestion, post nasal drip, epistaxis, sore throat, earache, hearing loss, dental pain, Tinnitus, Vertigo, Sinus pain or snoring.  Cardio: Denies chest pain, palpitations, irregular heartbeat, syncope, dyspnea, diaphoresis, orthopnea, PND, claudication or edema Respiratory: denies cough, dyspnea, DOE, pleurisy, hoarseness, laryngitis or wheezing.  Gastrointestinal: Denies dysphagia, heartburn, reflux, water brash, pain, cramps, nausea, vomiting, bloating, diarrhea, constipation, hematemesis, melena, hematochezia, jaundice or hemorrhoids Genitourinary: Denies dysuria, frequency, urgency, nocturia, hesitancy, discharge, hematuria or flank pain Musculoskeletal: Denies arthralgia, myalgia, stiffness, Jt. Swelling, pain, limp or strain/sprain. Denies Falls. Skin: Denies puritis, rash, hives, warts, acne, eczema or change in skin lesion Neuro: No weakness, tremor, incoordination, spasms, paresthesia or  pain Psychiatric: Denies confusion, memory loss or sensory loss. Denies Depression. Endocrine: Denies change in weight, skin, hair change, nocturia, and paresthesia, diabetic polys, visual blurring or hyper / hypo glycemic episodes.  Heme/Lymph: No excessive bleeding, bruising or enlarged lymph nodes.  Physical Exam  BP 126/64 mmHg  Pulse 52  Temp(Src) 97.5 F (36.4 C)  Resp 16  Ht 5' 8.5" (1.74 m)  Wt 183 lb 3.2 oz (83.099 kg)  BMI 27.45 kg/m2  General Appearance: Well nourished, in no apparent distress. Eyes: PERRLA, EOMs, conjunctiva no swelling or erythema, normal fundi and vessels. Sinuses: No frontal/maxillary tenderness ENT/Mouth: EACs patent / TMs  nl. Nares clear without erythema, swelling, mucoid exudates. Oral hygiene is good. No erythema, swelling, or exudate. Tongue normal, non-obstructing. Tonsils not swollen or erythematous. Hearing normal.  Neck: Supple, thyroid normal. No bruits, nodes or JVD. Respiratory: Respiratory effort normal.  BS equal and clear bilateral without rales, rhonci, wheezing or stridor. Cardio: Heart sounds are normal with regular rate and rhythm and no murmurs, rubs or gallops. Peripheral pulses are normal and equal bilaterally without edema. No aortic or femoral bruits. Chest: symmetric with normal excursions and percussion.  Abdomen: Soft, with Nl bowel sounds. Nontender, no guarding, rebound, hernias, masses, or organomegaly.  Lymphatics: Non tender without lymphadenopathy.  Genitourinary: No hernias.Testes nl. DRE - prostate nl for age - smooth & firm w/o nodules. Musculoskeletal: Full ROM all peripheral extremities, joint stability, 5/5 strength, and normal gait. Skin: Warm and dry without rashes, lesions, cyanosis, clubbing or  ecchymosis.  Neuro: Cranial nerves intact, reflexes equal bilaterally. Normal muscle tone, no cerebellar symptoms. Sensation intact.  Pysch: Alert and oriented X 3 with normal affect, insight and judgment appropriate.    Assessment and Plan  1. Annual Preventative/Screening Exam   - Microalbumin / creatinine urine ratio - EKG 12-Lead - [place], RETROPERITNL ABD,  LTD - POC Hemoccult Bld/Stl - PSA - CBC with Differential/Platelet - BASIC METABOLIC PANEL WITH GFR - Hepatic function panel - Magnesium - Lipid panel - TSH - Hemoglobin A1c - Insulin, random - VITAMIN D 25 Hydroxy - Urinalysis, Routine w reflex microscopic   2. Essential hypertension  - EKG 12-Lead - [place], RETROPERITNL ABD,  LTD - TSH  3. Hyperlipidemia  - Lipid panel - TSH  4. T2_NIDDM w/ CKD  - Microalbumin / creatinine urine ratio - Hemoglobin A1c - Insulin, random  5. Vitamin D deficiency  - VITAMIN D 25 Hydroxy   6. Elevated PSA   7. ASHD (arteriosclerotic heart disease)   8. Screening for rectal cancer  - POC Hemoccult Bld/Stl   9. Prostate cancer screening  - PSA  10. Gastroesophageal reflux disease   11. Medication management  - CBC with Differential/Platelet - BASIC METABOLIC PANEL WITH GFR - Hepatic function panel - Magnesium - Urinalysis, Routine w reflex microscopic   Continue prudent diet as discussed, weight control, BP monitoring, regular exercise, and medications as discussed.  Discussed med effects and SE's. Routine screening labs and tests as requested with regular follow-up as recommended. Over 40 minutes of exam, counseling, chart review and high complex critical decision making was performed

## 2015-12-09 NOTE — Patient Instructions (Signed)

## 2015-12-09 NOTE — Addendum Note (Signed)
Addended by: Unk Pinto on: 12/09/2015 07:07 PM   Modules accepted: Orders

## 2015-12-10 ENCOUNTER — Other Ambulatory Visit: Payer: Self-pay | Admitting: Internal Medicine

## 2015-12-10 DIAGNOSIS — R319 Hematuria, unspecified: Secondary | ICD-10-CM

## 2015-12-10 LAB — PSA: PSA: 3.17 ng/mL (ref <=4.00)

## 2015-12-10 LAB — MICROALBUMIN / CREATININE URINE RATIO
CREATININE, URINE: 161 mg/dL (ref 20–370)
MICROALB UR: 3.9 mg/dL
MICROALB/CREAT RATIO: 24 ug/mg{creat} (ref <30)

## 2015-12-10 LAB — VITAMIN D 25 HYDROXY (VIT D DEFICIENCY, FRACTURES): VIT D 25 HYDROXY: 82 ng/mL (ref 30–100)

## 2015-12-12 LAB — INSULIN, RANDOM: INSULIN: 14.3 u[IU]/mL (ref 2.0–19.6)

## 2016-01-06 ENCOUNTER — Encounter: Payer: Self-pay | Admitting: Cardiology

## 2016-01-06 ENCOUNTER — Other Ambulatory Visit: Payer: Self-pay | Admitting: Internal Medicine

## 2016-01-06 ENCOUNTER — Ambulatory Visit (INDEPENDENT_AMBULATORY_CARE_PROVIDER_SITE_OTHER): Payer: PPO | Admitting: Cardiology

## 2016-01-06 VITALS — BP 140/62 | HR 52 | Ht 70.0 in | Wt 185.1 lb

## 2016-01-06 DIAGNOSIS — I10 Essential (primary) hypertension: Secondary | ICD-10-CM | POA: Diagnosis not present

## 2016-01-06 DIAGNOSIS — I2510 Atherosclerotic heart disease of native coronary artery without angina pectoris: Secondary | ICD-10-CM

## 2016-01-06 DIAGNOSIS — E782 Mixed hyperlipidemia: Secondary | ICD-10-CM | POA: Diagnosis not present

## 2016-01-06 DIAGNOSIS — E1121 Type 2 diabetes mellitus with diabetic nephropathy: Secondary | ICD-10-CM

## 2016-01-06 NOTE — Patient Instructions (Signed)
Continue your current therapy  I will see you in 4 months  

## 2016-01-06 NOTE — Progress Notes (Signed)
Cardiology Office Note   Date:  01/06/2016   ID:  Daniel Burnett, DOB 19-Aug-1946, MRN [id number]  PCP:  Alesia Richards, MD  Cardiologist:  Dr. Kamirah Shugrue Martinique  Hiawatha Dressel Martinique, MD   Chief Complaint  Patient presents with  . Follow-up    unsure chest pain, has shortness of breath, no edema, has pain in legs, no lightheadedness or dizziness, no fatigue      History of Present Illness: Daniel Burnett is a 70 y.o. male with a history of HTN, HL, NIDDM, CKD III, BPH, d/c 12/22 after NSTEMI w/ DES x 2 LAD & DES OM2. Med rx for 70% RCA, EF 55-60% by echo.  When seen back for initial follow up visit Mr. Rathod complained of a lot of GI issues and indigestion. This improved somewhat with Zantac. He states this has been a chronic problem. He also reports he has had a bad recurrent chest cold that is just now getting better. When seen by Dr. Melford Aase in early March was noted to have some hematuria. Patient states he felt like he had a kidney stone at the time. No significant chest pain. His MI pain was lower mid sternal without radiation. He is walking at least one hour daily. No dyspnea or palpitations. He is eating a healthier diet. He is eating less red meat and is abstaining from alcohol.    Past Medical History  Diagnosis Date  . Hyperlipidemia   . Hypertension   . Diabetes mellitus without complication ([place])   . Vitamin D deficiency   . GERD (gastroesophageal reflux disease)   . ED (erectile dysfunction)   . Colitis   . BPH (benign prostatic hyperplasia)   . Kidney stone   . CAD (coronary artery disease)     a. Cath 09/2015: 100% stenosis Prox LAD --> 2 DES placed, 90% D2, 99% 2nd Mrg --> DES placed, 70% RCA --> Medically manage    Past Surgical History  Procedure Laterality Date  . Cystoscopy w/ retrogrades  1991    stone extractions  . Flexible sigmoidoscopy  1993    negative  . Squamous cell carcinoma excision  2001    bridge of nose  . Cardiac catheterization N/A  09/27/2015    Procedure: Left Heart Cath and Coronary Angiography;  Surgeon: Leonie Man, MD;  Location: [facility];  Service: Cardiovascular;  Laterality: N/A;  . Cardiac catheterization N/A 09/27/2015    Procedure: Coronary Stent Intervention;  Surgeon: Leonie Man, MD;  Location: [facility];  Service: Cardiovascular;  Laterality: N/A;  Lad/OM2    Current Outpatient Prescriptions  Medication Sig Dispense Refill  . aspirin 81 MG tablet Take 81 mg by mouth daily.    Marland Kitchen atenolol (TENORMIN) 25 MG tablet Take 0.5 tablets (12.5 mg total) by mouth daily. 30 tablet 6  . benazepril (LOTENSIN) 40 MG tablet TAKE 1 TABLET BY MOUTH EVERY DAY FOR BLOOD PRESSURE 90 tablet 3  . Cholecalciferol (VITAMIN D) 2000 UNITS CAPS Take 2,000 Units by mouth 2 (two) times daily.     Marland Kitchen glipiZIDE (GLUCOTROL) 5 MG tablet TAKE 1/2 TO 1 TABLET BY MOUTH TWICE DAILY WITH MEALS (Patient taking differently: TAKE 1/2 TABLET BY MOUTH TWICE DAILY WITH MEALS) 180 tablet 1  . magnesium gluconate (MAGONATE) 500 MG tablet Take 500 mg by mouth daily.    . metFORMIN (GLUCOPHAGE-XR) 500 MG 24 hr tablet TAKE 1-2 TABLETS TWICE A DAY FOR DIABETES (Patient taking differently: TAKE 1  TABLET TWICE A DAY FOR DIABETES) 360 tablet 1  . nitroGLYCERIN (NITROSTAT) 0.4 MG SL tablet Place 1 tablet (0.4 mg total) under the tongue every 5 (five) minutes x 3 doses as needed for chest pain. 25 tablet 2  . Omega-3 Fatty Acids (FISH OIL PO) Take 1 capsule by mouth 2 (two) times daily.     . ONE TOUCH ULTRA TEST test strip USE ONCE A DAY AS DIRECTED 100 each 1  . ranitidine (ZANTAC) 300 MG tablet Take 1 to 2 tablets daily for heartburn & reflux 180 tablet 3  . rosuvastatin (CRESTOR) 20 MG tablet Take 1 tablet (20 mg total) by mouth daily. 90 tablet 3  . ticagrelor (BRILINTA) 90 MG TABS tablet Take 1 tablet (90 mg total) by mouth 2 (two) times daily. 180 tablet 3   No current facility-administered medications for this visit.     Allergies:   Ppd and Tricor    Social History:  The patient  reports that he quit smoking about 53 years ago. He does not have any smokeless tobacco history on file. He reports that he does not drink alcohol or use illicit drugs.   Family History:  The patient's family history includes Cancer in his cousin; Diabetes in his maternal aunt; Heart attack in his father and mother; Heart disease in his brother, father, and mother; Hypertension in his brother; Leukemia in his cousin; Stroke in his brother.    ROS:  Please see the history of present illness. All other systems are reviewed and negative.    PHYSICAL EXAM: VS:  BP 140/62 mmHg  Pulse 52  Ht 5\' 10"  (1.778 m)  Wt 83.972 kg (185 lb 2 oz)  BMI 26.56 kg/m2  SpO2 99% , BMI Body mass index is 26.56 kg/(m^2). GEN: Well nourished, well developed, male in no acute distress HEENT: normal for age  Neck: no JVD, no carotid bruit, no masses Cardiac: RRR; normal 123456. [id number] systolic murmur at the LLSB. No gallop.  Respiratory:  clear to auscultation bilaterally, normal work of breathing GI: soft, nontender, nondistended, + BS MS: no deformity or atrophy; no edema; distal pulses are 2+ in all 4 extremities;  Distal pulses 2+.  Skin: warm and dry, no rash Neuro:  Strength and sensation are intact Psych: euthymic mood, full affect   EKG:  EKG is not ordered today.    Recent Labs: 12/09/2015: ALT 25; BUN 23; Creat 1.16; Hemoglobin 12.1*; Magnesium 1.9; Platelets 338; Potassium 4.7; Sodium 138; TSH 1.86    Lipid Panel    Component Value Date/Time   CHOL 87* 12/09/2015 1006   TRIG 122 12/09/2015 1006   HDL 54 12/09/2015 1006   CHOLHDL 1.6 12/09/2015 1006   VLDL 24 12/09/2015 1006   LDLCALC 9 12/09/2015 1006     Wt Readings from Last 3 Encounters:  01/06/16 83.972 kg (185 lb 2 oz)  12/09/15 83.099 kg (183 lb 3.2 oz)  10/24/15 84.823 kg (187 lb)     Other studies Reviewed: Additional studies/ records that were reviewed today  include: Hospital records, ECGs.  ASSESSMENT AND PLAN:  1.  CAD s/p NSTEMI in December 2016. Extensive stenting of the LAD and first OM. Residual 70% disease in the distal RCA. He is asymptomatic. He is on appropriate therapy with aspirin, beta blocker, ACE inhibitor and statin. Continue lifestyle modification.  2. Hyperlipidemia. Excellent control on Crestor.  3. HTN well controlled.  4. DM type 2 with CKD. Renal function improved. A1c demonstrates good diabetic  control.   5. Microscopic hematuria. ? Related to renal calculus. May want to repeat UA.   Current medicines are reviewed at length with the patient today.  The patient has concerns regarding medicines.CONCERNS were addressed   The following changes have been made: None  Labs/ tests ordered today include:   No orders of the defined types were placed in this encounter.    Disposition:   FU with Dr. Martinique in 4 months.  Signed, Rashon Rezek Martinique, MD  01/06/2016 8:10 AM    [place]

## 2016-01-10 DIAGNOSIS — Z0000 Encounter for general adult medical examination without abnormal findings: Secondary | ICD-10-CM | POA: Diagnosis not present

## 2016-01-10 DIAGNOSIS — R3121 Asymptomatic microscopic hematuria: Secondary | ICD-10-CM | POA: Diagnosis not present

## 2016-03-16 ENCOUNTER — Ambulatory Visit: Payer: Self-pay | Admitting: Internal Medicine

## 2016-03-16 ENCOUNTER — Encounter: Payer: Self-pay | Admitting: Internal Medicine

## 2016-03-16 ENCOUNTER — Ambulatory Visit (INDEPENDENT_AMBULATORY_CARE_PROVIDER_SITE_OTHER): Payer: PPO | Admitting: Internal Medicine

## 2016-03-16 VITALS — BP 124/70 | HR 48 | Temp 97.0°F | Resp 16 | Ht 68.5 in | Wt 180.7 lb

## 2016-03-16 DIAGNOSIS — E1129 Type 2 diabetes mellitus with other diabetic kidney complication: Secondary | ICD-10-CM | POA: Diagnosis not present

## 2016-03-16 DIAGNOSIS — E1121 Type 2 diabetes mellitus with diabetic nephropathy: Secondary | ICD-10-CM | POA: Diagnosis not present

## 2016-03-16 DIAGNOSIS — Z79899 Other long term (current) drug therapy: Secondary | ICD-10-CM | POA: Diagnosis not present

## 2016-03-16 DIAGNOSIS — E559 Vitamin D deficiency, unspecified: Secondary | ICD-10-CM | POA: Diagnosis not present

## 2016-03-16 DIAGNOSIS — I2510 Atherosclerotic heart disease of native coronary artery without angina pectoris: Secondary | ICD-10-CM | POA: Diagnosis not present

## 2016-03-16 DIAGNOSIS — I10 Essential (primary) hypertension: Secondary | ICD-10-CM

## 2016-03-16 DIAGNOSIS — R319 Hematuria, unspecified: Secondary | ICD-10-CM

## 2016-03-16 DIAGNOSIS — E782 Mixed hyperlipidemia: Secondary | ICD-10-CM | POA: Diagnosis not present

## 2016-03-16 NOTE — Patient Instructions (Signed)
Recommend Adult Low Dose Aspirin or   coated  Aspirin 81 mg daily   To reduce risk of Colon Cancer 20 %,   Skin Cancer 26 % ,   Melanoma 46%   and   Pancreatic cancer 60%   ++++++++++++++++++++++++++++++++++++++++++++++++++++++ Vitamin D goal   is between 70-100.   Please make sure that you are taking your Vitamin D as directed.   It is very important as a natural anti-inflammatory   helping hair, skin, and nails, as well as reducing stroke and heart attack risk.   It helps your bones and helps with mood.  It also decreases numerous cancer risks so please take it as directed.   Low Vit D is associated with a 200-300% higher risk for CANCER   and 200-300% higher risk for HEART   ATTACK  &  STROKE.   ......................................  It is also associated with higher death rate at younger ages,   autoimmune diseases like Rheumatoid arthritis, Lupus, Multiple Sclerosis.     Also many other serious conditions, like depression, Alzheimer's  Dementia, infertility, muscle aches, fatigue, fibromyalgia - just to name a few.  ++++++++++++++++++++++++++++++++++++++++++++++++  Recommend the book "The END of DIETING" by Dr Joel Fuhrman   & the book "The END of DIABETES " by Dr Joel Fuhrman  At [employer] - get book & Audio CD's     Being diabetic has a  300% increased risk for heart attack, stroke, cancer, and alzheimer- type vascular dementia. It is very important that you work harder with diet by avoiding all foods that are white. Avoid white rice (brown & wild rice is OK), white potatoes (sweetpotatoes in moderation is OK), White bread or wheat bread or anything made out of white flour like bagels, donuts, rolls, buns, biscuits, cakes, pastries, cookies, pizza crust, and pasta (made from white flour & egg whites) - vegetarian pasta or spinach or wheat pasta is OK. Multigrain breads like [employer] or [employer], or multigrain sandwich thins or flatbreads.  Diet,  exercise and weight loss can reverse and cure diabetes in the early stages.  Diet, exercise and weight loss is very important in the control and prevention of complications of diabetes which affects every system in your body, ie. Brain - dementia/stroke, eyes - glaucoma/blindness, heart - heart attack/heart failure, kidneys - dialysis, stomach - gastric paralysis, intestines - malabsorption, nerves - severe painful neuritis, circulation - gangrene & loss of a leg(s), and finally cancer and Alzheimers.    I recommend avoid fried & greasy foods,  sweets/candy, white rice (brown or wild rice or Quinoa is OK), white potatoes (sweet potatoes are OK) - anything made from white flour - bagels, doughnuts, rolls, buns, biscuits,white and wheat breads, pizza crust and traditional pasta made of white flour & egg white(vegetarian pasta or spinach or wheat pasta is OK).  Multi-grain bread is OK - like multi-grain flat bread or sandwich thins. Avoid alcohol in excess. Exercise is also important.    Eat all the vegetables you want - avoid meat, especially red meat and dairy - especially cheese.  Cheese is the most concentrated form of trans-fats which is the worst thing to clog up our arteries. Veggie cheese is OK which can be found in the fresh produce section at [employer] or [employer] or [employer]  ++++++++++++++++++++++++++++++++++++++++++++++++++ DASH Eating Plan  DASH stands for "Dietary Approaches to Stop Hypertension."   The DASH eating plan is a healthy eating plan that has been shown to reduce high blood   pressure (hypertension). Additional health benefits may include reducing the risk of type 2 diabetes mellitus, heart disease, and stroke. The DASH eating plan may also help with weight loss.  WHAT DO I NEED TO KNOW ABOUT THE DASH EATING PLAN?  For the DASH eating plan, you will follow these general guidelines:  Choose foods with a percent daily value for sodium of less than 5% (as listed on the food  label).  Use salt-free seasonings or herbs instead of table salt or sea salt.  Check with your health care provider or pharmacist before using salt substitutes.  Eat lower-sodium products, often labeled as "lower sodium" or "no salt added."  Eat fresh foods.  Eat more vegetables, fruits, and low-fat dairy products.    Choose whole grains. Look for the word "whole" as the first word in the ingredient list.  Choose fish   Limit sweets, desserts, sugars, and sugary drinks.  Choose heart-healthy fats.  Eat veggie cheese   Eat more home-cooked food and less restaurant, buffet, and fast food.  Limit fried foods.  Cook foods using methods other than frying.  Limit canned vegetables. If you do use them, rinse them well to decrease the sodium.  When eating at a restaurant, ask that your food be prepared with less salt, or no salt if possible.                      WHAT FOODS CAN I EAT?  Read Dr Joel Fuhrman's books on The End of Dieting & The End of Diabetes  Grains  Whole grain or whole wheat bread. Brown rice. Whole grain or whole wheat pasta. Quinoa, bulgur, and whole grain cereals. Low-sodium cereals. Corn or whole wheat flour tortillas. Whole grain cornbread. Whole grain crackers. Low-sodium crackers.  Vegetables  Fresh or frozen vegetables (raw, steamed, roasted, or grilled). Low-sodium or reduced-sodium tomato and vegetable juices. Low-sodium or reduced-sodium tomato sauce and paste. Low-sodium or reduced-sodium canned vegetables.   Fruits  All fresh, canned (in natural juice), or frozen fruits.  Protein Products   All fish and seafood.  Dried beans, peas, or lentils. Unsalted nuts and seeds. Unsalted canned beans.  Dairy  Low-fat dairy products, such as skim or 1% milk, 2% or reduced-fat cheeses, low-fat ricotta or cottage cheese, or plain low-fat yogurt. Low-sodium or reduced-sodium cheeses.  Fats and Oils  Tub margarines without trans fats. Light or  reduced-fat mayonnaise and salad dressings (reduced sodium). Avocado. Safflower, olive, or canola oils. Natural peanut or almond butter.  Other  Unsalted popcorn and pretzels. The items listed above may not be a complete list of recommended foods or beverages. Contact your dietitian for more options.  +++++++++++++++++++++++++++++++++++++++++++  WHAT FOODS ARE NOT RECOMMENDED?  Grains/ White flour or wheat flour  White bread. White pasta. White rice. Refined cornbread. Bagels and croissants. Crackers that contain trans fat.  Vegetables  Creamed or fried vegetables. Vegetables in a . Regular canned vegetables. Regular canned tomato sauce and paste. Regular tomato and vegetable juices.  Fruits  Dried fruits. Canned fruit in light or heavy syrup. Fruit juice.  Meat and Other Protein Products  Meat in general - RED mwaet & White meat.  Fatty cuts of meat. Ribs, chicken wings, bacon, sausage, bologna, salami, chitterlings, fatback, hot dogs, bratwurst, and packaged luncheon meats.  Dairy  Whole or 2% milk, cream, half-and-half, and cream cheese. Whole-fat or sweetened yogurt. Full-fat cheeses or blue cheese. Nondairy creamers and whipped toppings. Processed cheese, cheese spreads, or   cheese curds.  Condiments  Onion and garlic salt, seasoned salt, table salt, and sea salt. Canned and packaged gravies. Worcestershire sauce. Tartar sauce. Barbecue sauce. Teriyaki sauce. Soy sauce, including reduced sodium. Steak sauce. Fish sauce. Oyster sauce. Cocktail sauce. Horseradish. Ketchup and mustard. Meat flavorings and tenderizers. Bouillon cubes. Hot sauce. Tabasco sauce. Marinades. Taco seasonings. Relishes.  Fats and Oils Butter, stick margarine, lard, shortening and bacon fat. Coconut, palm kernel, or palm oils. Regular salad dressings.  Pickles and olives. Salted popcorn and pretzels.  The items listed above may not be a complete list of foods and beverages to avoid.   

## 2016-03-16 NOTE — Progress Notes (Signed)
Patient ID: Daniel Burnett, male   DOB: May 01, 1946, 70 y.o.   MRN: [id number]  [facility]                       Unk Pinto, M.D.        Uvaldo Bristle. Silverio Lay, P.A.-C       Starlyn Skeans, P.A.-C   [facility]                [address] [place], [place] [id number] Telephone [phone] Telefax [phone] _________________________________________________________________________     This very nice 70 y.o. MWM presents for  follow up with Hypertension, ASCAD, Hyperlipidemia, Pre-Diabetes and Vitamin D Deficiency.      Patient is treated for HTN & BP has been controlled at home. Today's BP: 124/70 mmHg.In Dec 2016, he had PCA/Stenting x 2 of the LAD & OM and has done well since.  Patient has had no complaints of any cardiac type chest pain, palpitations, dyspnea/orthopnea/PND, dizziness, claudication, or dependent edema.     Hyperlipidemia is controlled with diet & meds. Patient denies myalgias or other med SE's. Last Lipids were at goal with Cholesterol 87*; HDL 54; LDL 9; Triglycerides 122 on 12/09/2015.     Also, the patient has history of T2_NIDDM since 2002 and reports CBG's are running <100 mg%. He denies symptoms of reactive hypoglycemia, diabetic polys, paresthesias or visual blurring.  Last A1c was 6.0% on 12/09/2015.     Further, the patient also has history of Vitamin D Deficiency or "19" in 2008 and supplements vitamin D without any suspected side-effects. Last vitamin D was  82 on 12/09/2015.  Medication Sig  . aspirin 81 MG tablet Take 81 mg by mouth daily.  Marland Kitchen atenolol  25 MG tablet Take 0.5 tablets (12.5 mg total) by mouth daily.  . benazepril 40 MG tablet TAKE 1 TABLET BY MOUTH EVERY DAY FOR BLOOD PRESSURE  . VITAMIN D 2000 UNITS Take 2,000 Units by mouth 2 (two) times daily.   Marland Kitchen glipiZIDE  5 MG tablet TAKE 1/2 TO 1 TABLET BY MOUTH TWICE DAILY WITH MEALS   . magnesium 500 MG tablet Take 500  mg by mouth daily.  . metFORMIN-XR 500 MG  TAKE 1-2 TABLETS TWICE A DAY FOR DIABETES   . NITROSTAT 0.4 MG SL tablet Place 1 tablet (0.4 mg total) under the tongue every 5 (five) minutes x 3 doses as needed for chest pain.  . Omega-3 FISH OIL Take 1 capsule by mouth 2 (two) times daily.   . ranitidine (ZANTAC) 300 MG tablet Take 1 to 2 tablets daily for heartburn & reflux  . rosuvastatin (CRESTOR) 20 MG tablet Take 1 tablet (20 mg total) by mouth daily.  . ticagrelor (BRILINTA) 90 MG TABS  Take 1 tablet (90 mg total) by mouth 2 (two) times daily.   Allergies  Allergen Reactions  . Ppd [Tuberculin Purified Protein Derivative] Other (See Comments)    Positive tb test.  . Tricor [Fenofibrate] Other (See Comments)    GI upset   PMHx:   Past Medical History  Diagnosis Date  . Hyperlipidemia   . Hypertension   . Diabetes mellitus without complication ([place])   . Vitamin D deficiency   . GERD (gastroesophageal reflux disease)   . ED (erectile dysfunction)   . Colitis   . BPH (benign prostatic  hyperplasia)   . Kidney stone   . CAD (coronary artery disease)     a. Cath 09/2015: 100% stenosis Prox LAD --> 2 DES placed, 90% D2, 99% 2nd Mrg --> DES placed, 70% RCA --> Medically manage   Immunization History  Administered Date(s) Administered  . DT 02/09/2014  . DTaP 01/06/2005  . Influenza Whole 09/18/2012  . Pneumococcal Polysaccharide-23 09/18/2012   Past Surgical History  Procedure Laterality Date  . Cystoscopy w/ retrogrades  1991    stone extractions  . Flexible sigmoidoscopy  1993    negative  . Squamous cell carcinoma excision  2001    bridge of nose  . Cardiac catheterization N/A 09/27/2015    Procedure: Left Heart Cath and Coronary Angiography;  Surgeon: Leonie Man, MD  . Cardiac catheterization N/A 09/27/2015    Procedure: Coronary Stent Intervention;  Surgeon: Leonie Man, MD   FHx:    Reviewed / unchanged  SHx:    Reviewed / unchanged  Systems  Review:  Constitutional: Denies fever, chills, wt changes, headaches, insomnia, fatigue, night sweats, change in appetite. Eyes: Denies redness, blurred vision, diplopia, discharge, itchy, watery eyes.  ENT: Denies discharge, congestion, post nasal drip, epistaxis, sore throat, earache, hearing loss, dental pain, tinnitus, vertigo, sinus pain, snoring.  CV: Denies chest pain, palpitations, irregular heartbeat, syncope, dyspnea, diaphoresis, orthopnea, PND, claudication or edema. Respiratory: denies cough, dyspnea, DOE, pleurisy, hoarseness, laryngitis, wheezing.  Gastrointestinal: Denies dysphagia, odynophagia, heartburn, reflux, water brash, abdominal pain or cramps, nausea, vomiting, bloating, diarrhea, constipation, hematemesis, melena, hematochezia  or hemorrhoids. Genitourinary: Denies dysuria, frequency, urgency, nocturia, hesitancy, discharge, hematuria or flank pain. Musculoskeletal: Denies arthralgias, myalgias, stiffness, jt. swelling, pain, limping or strain/sprain.  Skin: Denies pruritus, rash, hives, warts, acne, eczema or change in skin lesion(s). Neuro: No weakness, tremor, incoordination, spasms, paresthesia or pain. Psychiatric: Denies confusion, memory loss or sensory loss. Endo: Denies change in weight, skin or hair change.  Heme/Lymph: No excessive bleeding, bruising or enlarged lymph nodes.  Physical Exam  BP 124/70 mmHg  Pulse 48  Temp(Src) 97 F (36.1 C)  Resp 16  Ht 5' 8.5" (1.74 m)  Wt 180 lb 11.2 oz (81.965 kg)  BMI 27.07 kg/m2  Appears well nourished and in no distress. Eyes: PERRLA, EOMs, conjunctiva no swelling or erythema. Sinuses: No frontal/maxillary tenderness ENT/Mouth: EAC's clear, TM's nl w/o erythema, bulging. Nares clear w/o erythema, swelling, exudates. Oropharynx clear without erythema or exudates. Oral hygiene is good. Tongue normal, non obstructing. Hearing intact.  Neck: Supple. Thyroid nl. Car 2+/2+ without bruits, nodes or JVD. Chest:  Respirations nl with BS clear & equal w/o rales, rhonchi, wheezing or stridor.  Cor: Heart sounds normal w/ regular rate and rhythm without sig. murmurs, gallops, clicks, or rubs. Peripheral pulses normal and equal  without edema.  Abdomen: Soft & bowel sounds normal. Non-tender w/o guarding, rebound, hernias, masses, or organomegaly.  Lymphatics: Unremarkable.  Musculoskeletal: Full ROM all peripheral extremities, joint stability, 5/5 strength, and normal gait.  Skin: Warm, dry without exposed rashes, lesions or ecchymosis apparent.  Neuro: Cranial nerves intact, reflexes equal bilaterally. Sensory-motor testing grossly intact. Tendon reflexes grossly intact.  Pysch: Alert & oriented x 3.  Insight and judgement nl & appropriate. No ideations.  Assessment and Plan:  1. Essential hypertension  - TSH  2. Hyperlipidemia  - Lipid panel - TSH  3. T2_NIDDM w/ CKD  - Hemoglobin A1c - Insulin, random  4. Vitamin D deficiency  - VITAMIN D 25 Hydroxy  5. ASHD (arteriosclerotic heart disease)   6. Medication management  - CBC with Differential/Platelet - BASIC METABOLIC PANEL WITH GFR - Hepatic function panel - Magnesium  7. Hematuria  - Urinalysis, Routine w reflex microscopic    Recommended regular exercise, BP monitoring, weight control, and discussed med and SE's. Recommended labs to assess and monitor clinical status. Further disposition pending results of labs. Over 30 minutes of exam, counseling, chart review was performed

## 2016-03-17 LAB — BASIC METABOLIC PANEL WITH GFR
BUN: 17 mg/dL (ref 7–25)
CALCIUM: 9 mg/dL (ref 8.6–10.3)
CO2: 23 mmol/L (ref 20–31)
CREATININE: 0.99 mg/dL (ref 0.70–1.18)
Chloride: 102 mmol/L (ref 98–110)
GFR, EST AFRICAN AMERICAN: 88 mL/min (ref >=60)
GFR, EST NON AFRICAN AMERICAN: 76 mL/min (ref >=60)
GLUCOSE: 106 mg/dL — AB (ref 65–99)
Potassium: 4.3 mmol/L (ref 3.5–5.3)
Sodium: 139 mmol/L (ref 135–146)

## 2016-03-17 LAB — CBC WITH DIFFERENTIAL/PLATELET
Basophils Absolute: 0 cells/uL (ref 0–200)
Basophils Relative: 0 %
EOS PCT: 6 %
Eosinophils Absolute: 438 cells/uL (ref 15–500)
HEMATOCRIT: 40.3 % (ref 38.5–50.0)
Hemoglobin: 13.4 g/dL (ref 13.2–17.1)
LYMPHS PCT: 20 %
Lymphs Abs: 1460 cells/uL (ref 850–3900)
MCH: 29.3 pg (ref 27.0–33.0)
MCHC: 33.3 g/dL (ref 32.0–36.0)
MCV: 88.2 fL (ref 80.0–100.0)
MONO ABS: 730 {cells}/uL (ref 200–950)
MPV: 10.1 fL (ref 7.5–12.5)
Monocytes Relative: 10 %
NEUTROS PCT: 64 %
Neutro Abs: 4672 cells/uL (ref 1500–7800)
Platelets: 243 10*3/uL (ref 140–400)
RBC: 4.57 MIL/uL (ref 4.20–5.80)
RDW: 14.5 % (ref 11.0–15.0)
WBC: 7.3 10*3/uL (ref 3.8–10.8)

## 2016-03-17 LAB — TSH: TSH: 1.84 mIU/L (ref 0.40–4.50)

## 2016-03-17 LAB — URINALYSIS, ROUTINE W REFLEX MICROSCOPIC
BILIRUBIN URINE: NEGATIVE
GLUCOSE, UA: NEGATIVE
Hgb urine dipstick: NEGATIVE
KETONES UR: NEGATIVE
Leukocytes, UA: NEGATIVE
Nitrite: NEGATIVE
PH: 6.5 (ref 5.0–8.0)
Protein, ur: NEGATIVE
SPECIFIC GRAVITY, URINE: 1.016 (ref 1.001–1.035)

## 2016-03-17 LAB — HEPATIC FUNCTION PANEL
ALBUMIN: 4 g/dL (ref 3.6–5.1)
ALT: 21 U/L (ref 9–46)
AST: 20 U/L (ref 10–35)
Alkaline Phosphatase: 56 U/L (ref 40–115)
BILIRUBIN INDIRECT: 0.5 mg/dL (ref 0.2–1.2)
Bilirubin, Direct: 0.2 mg/dL (ref <=0.2)
TOTAL PROTEIN: 6.2 g/dL (ref 6.1–8.1)
Total Bilirubin: 0.7 mg/dL (ref 0.2–1.2)

## 2016-03-17 LAB — MAGNESIUM: MAGNESIUM: 1.7 mg/dL (ref 1.5–2.5)

## 2016-03-17 LAB — LIPID PANEL
CHOLESTEROL: 86 mg/dL — AB (ref 125–200)
HDL: 62 mg/dL (ref >=40)
LDL Cholesterol: 14 mg/dL (ref <130)
TRIGLYCERIDES: 49 mg/dL (ref <150)
Total CHOL/HDL Ratio: 1.4 Ratio (ref <=5.0)
VLDL: 10 mg/dL (ref <30)

## 2016-03-17 LAB — HEMOGLOBIN A1C
Hgb A1c MFr Bld: 6.1 % — ABNORMAL HIGH (ref <5.7)
MEAN PLASMA GLUCOSE: 128 mg/dL

## 2016-03-17 LAB — VITAMIN D 25 HYDROXY (VIT D DEFICIENCY, FRACTURES): Vit D, 25-Hydroxy: 82 ng/mL (ref 30–100)

## 2016-03-17 LAB — INSULIN, RANDOM: Insulin: 10.1 u[IU]/mL (ref 2.0–19.6)

## 2016-03-30 ENCOUNTER — Ambulatory Visit (INDEPENDENT_AMBULATORY_CARE_PROVIDER_SITE_OTHER): Payer: PPO | Admitting: Physician Assistant

## 2016-03-30 ENCOUNTER — Encounter: Payer: Self-pay | Admitting: Internal Medicine

## 2016-03-30 VITALS — BP 114/70 | HR 48 | Temp 97.5°F | Resp 16 | Ht 68.5 in | Wt 177.4 lb

## 2016-03-30 DIAGNOSIS — L570 Actinic keratosis: Secondary | ICD-10-CM | POA: Diagnosis not present

## 2016-03-30 NOTE — Progress Notes (Signed)
Chief Complaint: Patient presents for evaluation of skin lesions. Patient has erythematous, scaly lesions on face and ear, changing in shape. He scrapped one off his forehead in the night, now with erythematous papules with dried tiny scab surrounded by erythematous area of 2x50mm.    Exam: suspicious lesion raised, border irregular, asymmetrical, bleeding, inflamed and red size 2x2 mm noted on the right forehead, with several actinic keratosis on ears and scalp  Procedure Details   The risks, benefits, indications, potential complications, and alternatives were explained to the patient and informed consent obtained.  Liquid nitrogen was use in a 3 freeze and thaw technique. The patient tolerated the procedure well.   Condition: Stable  Complications:  None  Diagnosis: Actinic Keratosis/superficial carcinoma  Procedure code:  [id number]  Plan: 1. Patient educated that the area will begin to heal in approximately a week.  2. Warning signs of infection were reviewed.    3. Recommended that the patient use OTC acetaminophen as needed for pain. 4. Follow up 3 months    Future Appointments Date Time Provider [place]  06/29/2016 8:45 AM Unk Pinto, MD [facility] None  07/06/2016 9:00 AM Peter M Martinique, MD [facility]  12/31/2016 9:00 AM Unk Pinto, MD [facility] None

## 2016-03-30 NOTE — Patient Instructions (Signed)
Actinic Keratosis Actinic keratosis is a precancerous growth on the skin. This means it could develop into skin cancer if it is not treated. About 1% of actinic keratoses turn into skin cancer within a year. It is important to have all such growths removed to prevent them from developing into skin cancer. CAUSES  Actinic keratosis is caused by getting too much ultraviolet (UV) radiation from the sun or other UV light sources. RISK FACTORS Factors that increase your chances of getting actinic keratosis include:  Having light-colored skin and blue eyes.  Having blonde or red hair.  Spending a lot of time in the sun.  Age. The risk of actinic keratosis increases with age. SYMPTOMS  Actinic keratosis growths look like scaly, rough spots of skin. They can be as small as a pinhead or as big as a quarter. They may itch, hurt, or feel sensitive. Sometimes there is a little tag of pink or gray skin growing off them. In some cases, actinic keratoses are easier felt than seen. They do not go away with the use of moisturizing lotions or creams. Actinic keratoses appear most often on areas of skin that get a lot of sun exposure. These areas include the:  Scalp.  Face.  Ears.  Lips.  Upper back.  Backs of the hands.  Forearms. DIAGNOSIS  Your health care provider can usually tell what is wrong by performing a physical exam. A tissue sample (biopsy) may also be taken and examined under a microscope. TREATMENT  Actinic keratosis can be treated several ways. Most treatments can be done in your health care provider's office. Treatment options may include:  Curettage. A tool is used to gently scrape off the growth.  Cryosurgery. Liquid nitrogen is applied to the growth to freeze it. The growth eventually falls off the skin.  Medicated creams, such as 5-fluorouracil or imiquimod. The medicine destroys the cells in the growth.  Chemical peels. Chemicals are applied to the growth and the outer  layers of skin are peeled off.  Photodynamic therapy. A drug that makes your skin more sensitive to light is applied to the skin. A strong, blue light is aimed at the skin and destroys the growth. PREVENTION  To prevent future sun damage:  Try to avoid the sun between 10:00 a.m. and 4:00 p.m. when it is the strongest.  Use a sunscreen or sunblock with SPF 30 or greater.  Apply sunscreen at least 30 minutes before exposure to the sun.  Always wear protective hats, clothing, and sunglasses with UV protection.  Avoid medicines, herbs, and foods that increase your sensitivity to sunlight.  Avoid tanning beds. HOME CARE INSTRUCTIONS   If your skin was covered with a bandage, change and remove the bandage as directed by your health care provider.  Keep the treated area dry as directed by your health care provider.  Apply any creams as prescribed by your health care provider. Follow the directions carefully.  Check your skin regularly for any changes.  Visit a skin doctor (dermatologist) every year for a skin exam. SEEK MEDICAL CARE IF:   Your skin does not heal and becomes irritated, red, or bleeds.  You notice any changes or new growths on your skin.   This information is not intended to replace advice given to you by your health care provider. Make sure you discuss any questions you have with your health care provider.   Document Released: 12/21/2008 Document Revised: 10/15/2014 Document Reviewed: 11/05/2011 Elsevier Interactive Patient Education 2016 [employer]  

## 2016-05-05 ENCOUNTER — Other Ambulatory Visit: Payer: Self-pay | Admitting: Internal Medicine

## 2016-05-23 ENCOUNTER — Other Ambulatory Visit: Payer: Self-pay | Admitting: Internal Medicine

## 2016-05-23 ENCOUNTER — Other Ambulatory Visit: Payer: Self-pay | Admitting: *Deleted

## 2016-05-23 MED ORDER — ATENOLOL 25 MG PO TABS: ORAL_TABLET | ORAL | 1 refills | Status: DC

## 2016-06-25 ENCOUNTER — Encounter: Payer: Self-pay | Admitting: Cardiology

## 2016-06-29 ENCOUNTER — Encounter: Payer: Self-pay | Admitting: Internal Medicine

## 2016-06-29 ENCOUNTER — Ambulatory Visit (INDEPENDENT_AMBULATORY_CARE_PROVIDER_SITE_OTHER): Payer: PPO | Admitting: Internal Medicine

## 2016-06-29 VITALS — BP 122/64 | HR 48 | Temp 97.3°F | Resp 16 | Ht 68.5 in | Wt 176.0 lb

## 2016-06-29 DIAGNOSIS — Z79899 Other long term (current) drug therapy: Secondary | ICD-10-CM

## 2016-06-29 DIAGNOSIS — E1121 Type 2 diabetes mellitus with diabetic nephropathy: Secondary | ICD-10-CM | POA: Diagnosis not present

## 2016-06-29 DIAGNOSIS — Z23 Encounter for immunization: Secondary | ICD-10-CM | POA: Diagnosis not present

## 2016-06-29 DIAGNOSIS — E782 Mixed hyperlipidemia: Secondary | ICD-10-CM | POA: Diagnosis not present

## 2016-06-29 DIAGNOSIS — I10 Essential (primary) hypertension: Secondary | ICD-10-CM

## 2016-06-29 DIAGNOSIS — E559 Vitamin D deficiency, unspecified: Secondary | ICD-10-CM | POA: Diagnosis not present

## 2016-06-29 DIAGNOSIS — I2510 Atherosclerotic heart disease of native coronary artery without angina pectoris: Secondary | ICD-10-CM | POA: Diagnosis not present

## 2016-06-29 LAB — CBC WITH DIFFERENTIAL/PLATELET
BASOS ABS: 96 {cells}/uL (ref 0–200)
BASOS PCT: 1 %
EOS PCT: 4 %
Eosinophils Absolute: 384 cells/uL (ref 15–500)
HCT: 40.6 % (ref 38.5–50.0)
HEMOGLOBIN: 13.5 g/dL (ref 13.2–17.1)
LYMPHS ABS: 2112 {cells}/uL (ref 850–3900)
Lymphocytes Relative: 22 %
MCH: 29.7 pg (ref 27.0–33.0)
MCHC: 33.3 g/dL (ref 32.0–36.0)
MCV: 89.4 fL (ref 80.0–100.0)
MPV: 9.6 fL (ref 7.5–12.5)
Monocytes Absolute: 768 cells/uL (ref 200–950)
Monocytes Relative: 8 %
NEUTROS ABS: 6240 {cells}/uL (ref 1500–7800)
Neutrophils Relative %: 65 %
Platelets: 251 10*3/uL (ref 140–400)
RBC: 4.54 MIL/uL (ref 4.20–5.80)
RDW: 14.1 % (ref 11.0–15.0)
WBC: 9.6 10*3/uL (ref 3.8–10.8)

## 2016-06-29 LAB — BASIC METABOLIC PANEL WITH GFR
BUN: 34 mg/dL — ABNORMAL HIGH (ref 7–25)
CHLORIDE: 102 mmol/L (ref 98–110)
CO2: 30 mmol/L (ref 20–31)
Calcium: 9.7 mg/dL (ref 8.6–10.3)
Creat: 1.39 mg/dL — ABNORMAL HIGH (ref 0.70–1.18)
GFR, EST NON AFRICAN AMERICAN: 51 mL/min — AB (ref >=60)
GFR, Est African American: 59 mL/min — ABNORMAL LOW (ref >=60)
Glucose, Bld: 74 mg/dL (ref 65–99)
POTASSIUM: 4.4 mmol/L (ref 3.5–5.3)
SODIUM: 140 mmol/L (ref 135–146)

## 2016-06-29 LAB — LIPID PANEL
CHOL/HDL RATIO: 1.5 ratio (ref <=5.0)
Cholesterol: 104 mg/dL — ABNORMAL LOW (ref 125–200)
HDL: 69 mg/dL (ref >=40)
LDL Cholesterol: 22 mg/dL (ref <130)
Triglycerides: 65 mg/dL (ref <150)
VLDL: 13 mg/dL (ref <30)

## 2016-06-29 LAB — HEPATIC FUNCTION PANEL
ALK PHOS: 51 U/L (ref 40–115)
ALT: 18 U/L (ref 9–46)
AST: 19 U/L (ref 10–35)
Albumin: 4.4 g/dL (ref 3.6–5.1)
BILIRUBIN DIRECT: 0.2 mg/dL (ref <=0.2)
BILIRUBIN INDIRECT: 0.5 mg/dL (ref 0.2–1.2)
BILIRUBIN TOTAL: 0.7 mg/dL (ref 0.2–1.2)
Total Protein: 6.6 g/dL (ref 6.1–8.1)

## 2016-06-29 LAB — MAGNESIUM: Magnesium: 2 mg/dL (ref 1.5–2.5)

## 2016-06-29 LAB — TSH: TSH: 1.76 mIU/L (ref 0.40–4.50)

## 2016-06-29 NOTE — Progress Notes (Signed)
[facility] Unk Pinto, M.D.        Uvaldo Bristle. Silverio Lay, P.A.-C       Starlyn Skeans, P.A.-C  [facility]                [address] [place] [id number] Telephone [phone] Telefax [phone] ______________________________________________________________________     This very nice 70 y.o. MWM presents for 6 month follow up with Hypertension, Hyperlipidemia, T2_NIDDM and Vitamin D Deficiency.      Patient is treated for HTN & BP has been controlled at home. Today's BP is 122/64. Patient has ASCAD & had Stents x 2 to the LAD & 1stOM in Dec 2016 by Dr Martinique and has done well w/o complaints of any cardiac type chest pain, palpitations, dyspnea/orthopnea/PND, dizziness, claudication, or dependent edema. Patient is c/o frequent easy bruising on the Brilinta 90 mg bid.      Hyperlipidemia is controlled with diet & meds. Patient denies myalgias or other med SE's. Last Lipids were at goal: Lab Results  Component Value Date   CHOL 86 (L) 03/16/2016   HDL 62 03/16/2016   LDLCALC 14 03/16/2016   TRIG 49 03/16/2016   CHOLHDL 1.4 03/16/2016      Also, the patient has history of T2_NIDDM and has had no symptoms of reactive hypoglycemia, diabetic polys, paresthesias or visual blurring. He reports CBG's <100 mg%.  Last A1c was not at goal:  Lab Results  Component Value Date   HGBA1C 6.1 (H) 03/16/2016      Further, the patient also has history of Vitamin D Deficiency of "19" in 2008 and supplements vitamin D without any suspected side-effects. Last vitamin D was at goal:  Lab Results  Component Value Date   VD25OH 82 03/16/2016   Current Outpatient Prescriptions on File Prior to Visit  Medication Sig  . aspirin 81 MG tablet Take 81 mg by mouth daily.  Marland Kitchen atenolol (TENORMIN) 25 MG tablet Take 1/2 to 1 tablet daily as directed.  . benazepril (LOTENSIN) 40 MG tablet TAKE 1 TABLET BY MOUTH EVERY DAY  FOR BLOOD PRESSURE  . Cholecalciferol (VITAMIN D) 2000 UNITS CAPS Take 2,000 Units by mouth 2 (two) times daily.   Marland Kitchen glipiZIDE 5 MG tablet TAKE 1/2 TO 1 TABLET BY MOUTH TWICE DAILY WITH MEALS (Patient taking differently: TAKE 1/2 TABLET BY MOUTH TWICE DAILY WITH MEALS)  . magnesium500 MG Take 500 mg by mouth daily.  . metFORMIN-XR 500 MG  TAKE 1-2 TABLETS TWICE A DAY FOR DIABETES  . NITROSTAT 0.4 MG SL Place 1 tablet (0.4 mg total) under the tongue every 5 (five) minutes x 3 doses as needed for chest pain.  . Omega-3 FISH OIL  Take 1 capsule by mouth 2 (two) times daily.   . Ranitidine  300 MG Take 1 to 2 tablets daily for heartburn & reflux  . rosuvastatin  20 MG  Take 1 tablet (20 mg total) by mouth daily.  . ticagrelor (BRILINTA) 90 MG  Take 1 tablet (90 mg total) by mouth 2 (two) times daily.    Allergies  Allergen Reactions  . Ppd [Tuberculin Purified Protein Derivative] Other (See Comments)    Positive tb test.  . Tricor [Fenofibrate] Other (See Comments)    GI upset   PMHx:   Past Medical History:  Diagnosis Date  . BPH (benign prostatic hyperplasia)   .  CAD (coronary artery disease)    a. Cath 09/2015: 100% stenosis Prox LAD --> 2 DES placed, 90% D2, 99% 2nd Mrg --> DES placed, 70% RCA --> Medically manage  . Colitis   . Diabetes mellitus without complication ([place])   . ED (erectile dysfunction)   . GERD (gastroesophageal reflux disease)   . Hyperlipidemia   . Hypertension   . Kidney stone   . Vitamin D deficiency    Immunization History  Administered Date(s) Administered  . DT 02/09/2014  . DTaP 01/06/2005  . Influenza Whole 09/18/2012  . Influenza, High Dose Seasonal PF 06/29/2016  . Pneumococcal Conjugate-13 06/29/2016  . Pneumococcal Polysaccharide-23 09/18/2012   Past Surgical History:  Procedure Laterality Date  . CARDIAC CATHETERIZATION N/A 09/27/2015   Procedure: Left Heart Cath and Coronary Angiography;  Surgeon: Leonie Man, MD;  Location: [facility];  Service: Cardiovascular;  Laterality: N/A;  . CARDIAC CATHETERIZATION N/A 09/27/2015   Procedure: Coronary Stent Intervention;  Surgeon: Leonie Man, MD;  Location: [facility];  Service: Cardiovascular;  Laterality: N/A;  Lad/OM2  . CYSTOSCOPY W/ RETROGRADES  1991   stone extractions  . [place]   negative  . SQUAMOUS CELL CARCINOMA EXCISION  2001   bridge of nose   FHx:    Reviewed / unchanged  SHx:    Reviewed / unchanged  Systems Review:  Constitutional: Denies fever, chills, wt changes, headaches, insomnia, fatigue, night sweats, change in appetite. Eyes: Denies redness, blurred vision, diplopia, discharge, itchy, watery eyes.  ENT: Denies discharge, congestion, post nasal drip, epistaxis, sore throat, earache, hearing loss, dental pain, tinnitus, vertigo, sinus pain, snoring.  CV: Denies chest pain, palpitations, irregular heartbeat, syncope, dyspnea, diaphoresis, orthopnea, PND, claudication or edema. Respiratory: denies cough, dyspnea, DOE, pleurisy, hoarseness, laryngitis, wheezing.  Gastrointestinal: Denies dysphagia, odynophagia, heartburn, reflux, water brash, abdominal pain or cramps, nausea, vomiting, bloating, diarrhea, constipation, hematemesis, melena, hematochezia  or hemorrhoids. Genitourinary: Denies dysuria, frequency, urgency, nocturia, hesitancy, discharge, hematuria or flank pain. Musculoskeletal: Denies arthralgias, myalgias, stiffness, jt. swelling, pain, limping or strain/sprain.  Skin: Denies pruritus, rash, hives, warts, acne, eczema or change in skin lesion(s). Neuro: No weakness, tremor, incoordination, spasms, paresthesia or pain. Psychiatric: Denies confusion, memory loss or sensory loss. Endo: Denies change in weight, skin or hair change.  Heme/Lymph: No excessive bleeding, bruising or enlarged lymph nodes.  Physical Exam BP 122/64   Pulse (!) 48   Temp 97.3 F (36.3 C)   Resp 16   Ht 5' 8.5" (1.74  m)   Wt 176 lb (79.8 kg)   BMI 26.37 kg/m   Appears well nourished and in no distress.  Eyes: PERRLA, EOMs, conjunctiva no swelling or erythema. Sinuses: No frontal/maxillary tenderness ENT/Mouth: EAC's clear, TM's nl w/o erythema, bulging. Nares clear w/o erythema, swelling, exudates. Oropharynx clear without erythema or exudates. Oral hygiene is good. Tongue normal, non obstructing. Hearing intact.  Neck: Supple. Thyroid nl. Car 2+/2+ without bruits, nodes or JVD. Chest: Respirations nl with BS clear & equal w/o rales, rhonchi, wheezing or stridor.  Cor: Heart sounds normal w/ regular rate and rhythm without sig. murmurs, gallops, clicks, or rubs. Peripheral pulses normal and equal  without edema.  Abdomen: Soft & bowel sounds normal. Non-tender w/o guarding, rebound, hernias, masses, or organomegaly.  Lymphatics: Unremarkable.  Musculoskeletal: Full ROM all peripheral extremities, joint stability, 5/5 strength, and normal gait.  Skin: Warm, dry without exposed rashes, lesions or ecchymosis apparent.  Neuro: Cranial  nerves intact, reflexes equal bilaterally. Sensory-motor testing grossly intact. Tendon reflexes grossly intact.  Pysch: Alert & oriented x 3.  Insight and judgement nl & appropriate. No ideations.  Assessment and Plan:  1. Essential hypertension  - Continue medication, monitor blood pressure at home. Continue DASH diet. Reminder to go to the ER if any CP, SOB, nausea, dizziness, severe HA, changes vision/speech, left arm numbness and tingling and jaw pain. - TSH  2. Hyperlipidemia  - Continue diet/meds, exercise,& lifestyle modifications. Continue monitor periodic cholesterol/liver & renal functions  - Lipid panel - TSH  3. T2_NIDDM w/ CKD  - Continue diet, exercise, lifestyle modifications. Monitor appropriate labs. - Hemoglobin A1c - Insulin, random  4. Vitamin D deficiency  - Continue supplementation. - VITAMIN D 25 Hydroxy   5. ASHD (arteriosclerotic  heart disease)   6. Medication management  - CBC with Differential/Platelet - BASIC METABOLIC PANEL WITH GFR - Hepatic function panel - Magnesium  7. Need for prophylactic vaccination and inoculation against influenza  - Flu vaccine HIGH DOSE PF (Fluzone High dose)  8. Need for prophylactic vaccination against Streptococcus pneumoniae (pneumococcus)  - Pneumococcal conjugate vaccine 13-valent       Recommended regular exercise, BP monitoring, weight control, and discussed med and SE's. Recommended labs to assess and monitor clinical status. Further disposition pending results of labs. Over 30 minutes of exam, counseling, chart review was performed

## 2016-06-29 NOTE — Patient Instructions (Signed)
Recommend Adult Low Dose Aspirin or  coated  Aspirin 81 mg daily  To reduce risk of Colon Cancer 20 %,  Skin Cancer 26 % ,  Melanoma 46%  and  Pancreatic cancer 60% +++++++++++++++++++++++++ Vitamin D goal  is between 70-100.  Please make sure that you are taking your Vitamin D as directed.  It is very important as a natural anti-inflammatory  helping hair, skin, and nails, as well as reducing stroke and heart attack risk.  It helps your bones and helps with mood. It also decreases numerous cancer risks so please take it as directed.  Low Vit D is associated with a 200-300% higher risk for CANCER  and 200-300% higher risk for HEART   ATTACK  &  STROKE.   ...................................... It is also associated with higher death rate at younger ages,  autoimmune diseases like Rheumatoid arthritis, Lupus, Multiple Sclerosis.    Also many other serious conditions, like depression, Alzheimer's Dementia, infertility, muscle aches, fatigue, fibromyalgia - just to name a few. ++++++++++++++++++++ Recommend the book "The END of DIETING" by Dr Joel Fuhrman  & the book "The END of DIABETES " by Dr Joel Fuhrman At [employer] - get book & Audio CD's    Being diabetic has a  300% increased risk for heart attack, stroke, cancer, and alzheimer- type vascular dementia. It is very important that you work harder with diet by avoiding all foods that are white. Avoid white rice (brown & wild rice is OK), white potatoes (sweetpotatoes in moderation is OK), White bread or wheat bread or anything made out of white flour like bagels, donuts, rolls, buns, biscuits, cakes, pastries, cookies, pizza crust, and pasta (made from white flour & egg whites) - vegetarian pasta or spinach or wheat pasta is OK. Multigrain breads like Marcellas's or Pepperidge Farm, or multigrain sandwich thins or flatbreads.  Diet, exercise and weight loss can reverse and cure diabetes in the early stages.  Diet, exercise and weight loss is  very important in the control and prevention of complications of diabetes which affects every system in your body, ie. Brain - dementia/stroke, eyes - glaucoma/blindness, heart - heart attack/heart failure, kidneys - dialysis, stomach - gastric paralysis, intestines - malabsorption, nerves - severe painful neuritis, circulation - gangrene & loss of a leg(s), and finally cancer and Alzheimers.    I recommend avoid fried & greasy foods,  sweets/candy, white rice (brown or wild rice or Quinoa is OK), white potatoes (sweet potatoes are OK) - anything made from white flour - bagels, doughnuts, rolls, buns, biscuits,white and wheat breads, pizza crust and traditional pasta made of white flour & egg white(vegetarian pasta or spinach or wheat pasta is OK).  Multi-grain bread is OK - like multi-grain flat bread or sandwich thins. Avoid alcohol in excess. Exercise is also important.    Eat all the vegetables you want - avoid meat, especially red meat and dairy - especially cheese.  Cheese is the most concentrated form of trans-fats which is the worst thing to clog up our arteries. Veggie cheese is OK which can be found in the fresh produce section at [employer] or [employer] or [employer]  +++++++++++++++++++++ DASH Eating Plan  DASH stands for "Dietary Approaches to Stop Hypertension."   The DASH eating plan is a healthy eating plan that has been shown to reduce high blood pressure (hypertension). Additional health benefits may include reducing the risk of type 2 diabetes mellitus, heart disease, and stroke. The DASH eating plan may also   help with weight loss. WHAT DO I NEED TO KNOW ABOUT THE DASH EATING PLAN? For the DASH eating plan, you will follow these general guidelines:  Choose foods with a percent daily value for sodium of less than 5% (as listed on the food label).  Use salt-free seasonings or herbs instead of table salt or sea salt.  Check with your health care provider or pharmacist before  using salt substitutes.  Eat lower-sodium products, often labeled as "lower sodium" or "no salt added."  Eat fresh foods.  Eat more vegetables, fruits, and low-fat dairy products.  Choose whole grains. Look for the word "whole" as the first word in the ingredient list.  Choose fish   Limit sweets, desserts, sugars, and sugary drinks.  Choose heart-healthy fats.  Eat veggie cheese   Eat more home-cooked food and less restaurant, buffet, and fast food.  Limit fried foods.  Cook foods using methods other than frying.  Limit canned vegetables. If you do use them, rinse them well to decrease the sodium.  When eating at a restaurant, ask that your food be prepared with less salt, or no salt if possible.                      WHAT FOODS CAN I EAT? Read Dr Joel Fuhrman's books on The End of Dieting & The End of Diabetes  Grains Whole grain or whole wheat bread. Brown rice. Whole grain or whole wheat pasta. Quinoa, bulgur, and whole grain cereals. Low-sodium cereals. Corn or whole wheat flour tortillas. Whole grain cornbread. Whole grain crackers. Low-sodium crackers.  Vegetables Fresh or frozen vegetables (raw, steamed, roasted, or grilled). Low-sodium or reduced-sodium tomato and vegetable juices. Low-sodium or reduced-sodium tomato sauce and paste. Low-sodium or reduced-sodium canned vegetables.   Fruits All fresh, canned (in natural juice), or frozen fruits.  Protein Products  All fish and seafood.  Dried beans, peas, or lentils. Unsalted nuts and seeds. Unsalted canned beans.  Dairy Low-fat dairy products, such as skim or 1% milk, 2% or reduced-fat cheeses, low-fat ricotta or cottage cheese, or plain low-fat yogurt. Low-sodium or reduced-sodium cheeses.  Fats and Oils Tub margarines without trans fats. Light or reduced-fat mayonnaise and salad dressings (reduced sodium). Avocado. Safflower, olive, or canola oils. Natural peanut or almond butter.  Other Unsalted popcorn  and pretzels. The items listed above may not be a complete list of recommended foods or beverages. Contact your dietitian for more options.  +++++++++++++++  WHAT FOODS ARE NOT RECOMMENDED? Grains/ White flour or wheat flour White bread. White pasta. White rice. Refined cornbread. Bagels and croissants. Crackers that contain trans fat.  Vegetables  Creamed or fried vegetables. Vegetables in a . Regular canned vegetables. Regular canned tomato sauce and paste. Regular tomato and vegetable juices.  Fruits Dried fruits. Canned fruit in light or heavy syrup. Fruit juice.  Meat and Other Protein Products Meat in general - RED meat & White meat.  Fatty cuts of meat. Ribs, chicken wings, all processed meats as bacon, sausage, bologna, salami, fatback, hot dogs, bratwurst and packaged luncheon meats.  Dairy Whole or 2% milk, cream, half-and-half, and cream cheese. Whole-fat or sweetened yogurt. Full-fat cheeses or blue cheese. Non-dairy creamers and whipped toppings. Processed cheese, cheese spreads, or cheese curds.  Condiments Onion and garlic salt, seasoned salt, table salt, and sea salt. Canned and packaged gravies. Worcestershire sauce. Tartar sauce. Barbecue sauce. Teriyaki sauce. Soy sauce, including reduced sodium. Steak sauce. Fish sauce. Oyster sauce. Cocktail   sauce. Horseradish. Ketchup and mustard. Meat flavorings and tenderizers. Bouillon cubes. Hot sauce. Tabasco sauce. Marinades. Taco seasonings. Relishes.  Fats and Oils Butter, stick margarine, lard, shortening and bacon fat. Coconut, palm kernel, or palm oils. Regular salad dressings.  Pickles and olives. Salted popcorn and pretzels.  The items listed above may not be a complete list of foods and beverages to avoid.   

## 2016-06-30 LAB — HEMOGLOBIN A1C
Hgb A1c MFr Bld: 5.6 % (ref <5.7)
MEAN PLASMA GLUCOSE: 114 mg/dL

## 2016-06-30 LAB — INSULIN, RANDOM: INSULIN: 9 u[IU]/mL (ref 2.0–19.6)

## 2016-06-30 LAB — VITAMIN D 25 HYDROXY (VIT D DEFICIENCY, FRACTURES): VIT D 25 HYDROXY: 84 ng/mL (ref 30–100)

## 2016-07-05 NOTE — Progress Notes (Signed)
Cardiology Office Note   Date:  07/06/2016   ID:  Daniel Burnett, DOB May 23, 1946, MRN [id number]  PCP:  Alesia Richards, MD  Cardiologist:  Dr. Dinesha Twiggs Martinique  Erielle Gawronski [place], MD   Chief Complaint  Patient presents with  . Follow-up    4 MONTHS  . Coronary Artery Disease    History of Present Illness: Daniel Burnett is a 70 y.o. male with a history of HTN, HL, NIDDM, CKD III, BPH, d/c 12/22 after NSTEMI w/ DES x 2 LAD & DES OM2. Med rx for 70% RCA, EF 55-60% by echo.  When seen back for initial follow up visit Daniel Burnett complained of a lot of GI issues and indigestion. This improved somewhat with Zantac. He states this has been a chronic problem. He still notes some indigestion. His MI pain was lower mid sternal without radiation. He is walking at least one hour daily. [place]. No dyspnea or palpitations. He is eating a healthier diet. Does note easy bruising.   Past Medical History:  Diagnosis Date  . BPH (benign prostatic hyperplasia)   . CAD (coronary artery disease)    a. Cath 09/2015: 100% stenosis Prox LAD --> 2 DES placed, 90% D2, 99% 2nd Mrg --> DES placed, 70% RCA --> Medically manage  . Colitis   . Diabetes mellitus without complication ([place])   . ED (erectile dysfunction)   . GERD (gastroesophageal reflux disease)   . Hyperlipidemia   . Hypertension   . Kidney stone   . Vitamin D deficiency     Past Surgical History:  Procedure Laterality Date  . CARDIAC CATHETERIZATION N/A 09/27/2015   Procedure: Left Heart Cath and Coronary Angiography;  Surgeon: Leonie Man, MD;  Location: [facility];  Service: Cardiovascular;  Laterality: N/A;  . CARDIAC CATHETERIZATION N/A 09/27/2015   Procedure: Coronary Stent Intervention;  Surgeon: Leonie Man, MD;  Location: [facility];  Service: Cardiovascular;  Laterality: N/A;  Lad/OM2  . CYSTOSCOPY W/ RETROGRADES  1991   stone extractions  . [place]   negative    . SQUAMOUS CELL CARCINOMA EXCISION  2001   bridge of nose    Current Outpatient Prescriptions  Medication Sig Dispense Refill  . aspirin 81 MG tablet Take 81 mg by mouth daily.    Marland Kitchen atenolol (TENORMIN) 25 MG tablet Take 1/2 to 1 tablet daily as directed. 90 tablet 1  . benazepril (LOTENSIN) 40 MG tablet TAKE 1 TABLET BY MOUTH EVERY DAY FOR BLOOD PRESSURE 90 tablet 3  . Cholecalciferol (VITAMIN D) 2000 UNITS CAPS Take 2,000 Units by mouth 2 (two) times daily.     Marland Kitchen glipiZIDE (GLUCOTROL) 5 MG tablet TAKE 1/2 TO 1 TABLET BY MOUTH TWICE DAILY WITH MEALS (Patient taking differently: TAKE 1/2 TABLET BY MOUTH TWICE DAILY WITH MEALS) 180 tablet 1  . magnesium gluconate (MAGONATE) 500 MG tablet Take 500 mg by mouth daily.    . metFORMIN (GLUCOPHAGE-XR) 500 MG 24 hr tablet TAKE 1-2 TABLETS TWICE A DAY FOR DIABETES 360 tablet 1  . nitroGLYCERIN (NITROSTAT) 0.4 MG SL tablet Place 1 tablet (0.4 mg total) under the tongue every 5 (five) minutes x 3 doses as needed for chest pain. 25 tablet 2  . Omega-3 Fatty Acids (FISH OIL PO) Take 1 capsule by mouth 2 (two) times daily.     . ONE TOUCH ULTRA TEST test strip USE ONCE A DAY AS DIRECTED 100 each  99  . ranitidine (ZANTAC) 300 MG tablet Take 1 to 2 tablets daily for heartburn & reflux 180 tablet 3  . rosuvastatin (CRESTOR) 20 MG tablet Take 0.5 tablets (10 mg total) by mouth daily. 90 tablet 3  . ticagrelor (BRILINTA) 90 MG TABS tablet Take 1 tablet (90 mg total) by mouth 2 (two) times daily. 180 tablet 3   No current facility-administered medications for this visit.     Allergies:   Ppd [tuberculin purified protein derivative] and Tricor [fenofibrate]    Social History:  The patient  reports that he quit smoking about 53 years ago. He has never used smokeless tobacco. He reports that he does not drink alcohol or use drugs.   Family History:  The patient's family history includes Cancer in his cousin; Diabetes in his maternal aunt; Heart attack in his  father and mother; Heart disease in his brother, father, and mother; Hypertension in his brother; Leukemia in his cousin; Stroke in his brother.    ROS:  Please see the history of present illness. All other systems are reviewed and negative.    PHYSICAL EXAM: VS:  BP 132/64   Pulse (!) 51   Ht 5\' 10"  (1.778 m)   Wt 176 lb (79.8 kg)   BMI 25.25 kg/m  , BMI Body mass index is 25.25 kg/m. GEN: Well nourished, well developed, male in no acute distress  HEENT: normal for age  Neck: no JVD, no carotid bruit, no masses Cardiac: RRR; normal 123456. [id number] systolic murmur at the LLSB. No gallop.  Respiratory:  clear to auscultation bilaterally, normal work of breathing GI: soft, nontender, nondistended, + BS MS: no deformity or atrophy; no edema; distal pulses are 2+ in all 4 extremities;  Distal pulses 2+.   Skin: warm and dry, no rash Neuro:  Strength and sensation are intact Psych: euthymic mood, full affect   EKG:  EKG is not ordered today.    Recent Labs: 06/29/2016: ALT 18; BUN 34; Creat 1.39; Hemoglobin 13.5; Magnesium 2.0; Platelets 251; Potassium 4.4; Sodium 140; TSH 1.76    Lipid Panel    Component Value Date/Time   CHOL 104 (L) 06/29/2016 0921   TRIG 65 06/29/2016 0921   HDL 69 06/29/2016 0921   CHOLHDL 1.5 06/29/2016 0921   VLDL 13 06/29/2016 0921   LDLCALC 22 06/29/2016 0921     Wt Readings from Last 3 Encounters:  07/06/16 176 lb (79.8 kg)  06/29/16 176 lb (79.8 kg)  03/30/16 177 lb 6.4 oz (80.5 kg)     Other studies Reviewed: Additional studies/ records that were reviewed today include: Hospital records, ECGs.  ASSESSMENT AND PLAN:  1.  CAD s/p NSTEMI in December 2016. Extensive stenting of the LAD and first OM. Residual 70% disease in the distal RCA. He is asymptomatic. He is on appropriate therapy with aspirin, beta blocker, ACE inhibitor and statin. Continue lifestyle modification. After Dec 22 he can stop Brilinta and stay on ASA only.  2.  Hyperlipidemia. Excellent control on Crestor. Dose recently reduced to 10 mg daily.  3. HTN well controlled.  4. DM type 2 with CKD. Renal function improved. A1c demonstrates good diabetic control.    Current medicines are reviewed at length with the patient today.  The patient has no concerns regarding medicines.  The following changes have been made: None  Labs/ tests ordered today include:   No orders of the defined types were placed in this encounter.   Disposition:   FU with  Dr. Martinique in 6 months.  Signed, Dresean Beckel [place], MD  07/06/2016 9:23 AM    [facility]

## 2016-07-06 ENCOUNTER — Encounter: Payer: Self-pay | Admitting: Cardiology

## 2016-07-06 ENCOUNTER — Ambulatory Visit (INDEPENDENT_AMBULATORY_CARE_PROVIDER_SITE_OTHER): Payer: PPO | Admitting: Cardiology

## 2016-07-06 VITALS — BP 132/64 | HR 51 | Ht 70.0 in | Wt 176.0 lb

## 2016-07-06 DIAGNOSIS — I10 Essential (primary) hypertension: Secondary | ICD-10-CM

## 2016-07-06 DIAGNOSIS — I2510 Atherosclerotic heart disease of native coronary artery without angina pectoris: Secondary | ICD-10-CM

## 2016-07-06 DIAGNOSIS — E782 Mixed hyperlipidemia: Secondary | ICD-10-CM | POA: Diagnosis not present

## 2016-07-06 MED ORDER — ROSUVASTATIN CALCIUM 20 MG PO TABS: 10.0000 mg | ORAL_TABLET | Freq: Every day | ORAL | 3 refills | Status: DC

## 2016-07-06 NOTE — Patient Instructions (Signed)
On December 20 this year you can stop taking Brilinta and continue your other therapy  I will see you in 6 months

## 2016-07-10 ENCOUNTER — Other Ambulatory Visit: Payer: Self-pay | Admitting: Internal Medicine

## 2016-08-03 ENCOUNTER — Other Ambulatory Visit: Payer: Self-pay | Admitting: Internal Medicine

## 2016-08-24 ENCOUNTER — Other Ambulatory Visit: Payer: Self-pay | Admitting: Physician Assistant

## 2016-08-24 NOTE — Telephone Encounter (Signed)
Rx has been sent to the pharmacy electronically. ° °

## 2016-08-27 ENCOUNTER — Telehealth: Payer: Self-pay | Admitting: Cardiology

## 2016-08-27 NOTE — Telephone Encounter (Signed)
F/u message ° °Pt states he is returning RN call. Please call back to discuss  °

## 2016-08-27 NOTE — Telephone Encounter (Signed)
Pt called again,he said would you please call before 12:00. I instructed pt that Dr Martinique and youu were seeing patients.

## 2016-08-27 NOTE — Telephone Encounter (Signed)
Returned call to patient this morning.He requested Brilinta 90 mg samples.Samples left at [facility] office front desk.

## 2016-08-27 NOTE — Telephone Encounter (Signed)
Sent to [facility] - I cannot locate documentation or reason for call.

## 2016-10-05 ENCOUNTER — Ambulatory Visit: Payer: Self-pay | Admitting: Internal Medicine

## 2016-10-09 ENCOUNTER — Encounter: Payer: Self-pay | Admitting: Internal Medicine

## 2016-10-09 ENCOUNTER — Ambulatory Visit (INDEPENDENT_AMBULATORY_CARE_PROVIDER_SITE_OTHER): Payer: PPO | Admitting: Internal Medicine

## 2016-10-09 VITALS — BP 128/70 | HR 54 | Temp 98.4°F | Resp 16 | Ht 70.0 in | Wt 180.0 lb

## 2016-10-09 DIAGNOSIS — I2510 Atherosclerotic heart disease of native coronary artery without angina pectoris: Secondary | ICD-10-CM

## 2016-10-09 DIAGNOSIS — E559 Vitamin D deficiency, unspecified: Secondary | ICD-10-CM | POA: Diagnosis not present

## 2016-10-09 DIAGNOSIS — K219 Gastro-esophageal reflux disease without esophagitis: Secondary | ICD-10-CM

## 2016-10-09 DIAGNOSIS — I25110 Atherosclerotic heart disease of native coronary artery with unstable angina pectoris: Secondary | ICD-10-CM

## 2016-10-09 DIAGNOSIS — Z79899 Other long term (current) drug therapy: Secondary | ICD-10-CM | POA: Diagnosis not present

## 2016-10-09 DIAGNOSIS — E1121 Type 2 diabetes mellitus with diabetic nephropathy: Secondary | ICD-10-CM | POA: Diagnosis not present

## 2016-10-09 DIAGNOSIS — R6889 Other general symptoms and signs: Secondary | ICD-10-CM | POA: Diagnosis not present

## 2016-10-09 DIAGNOSIS — I214 Non-ST elevation (NSTEMI) myocardial infarction: Secondary | ICD-10-CM

## 2016-10-09 DIAGNOSIS — Z0000 Encounter for general adult medical examination without abnormal findings: Secondary | ICD-10-CM

## 2016-10-09 DIAGNOSIS — I10 Essential (primary) hypertension: Secondary | ICD-10-CM

## 2016-10-09 DIAGNOSIS — R972 Elevated prostate specific antigen [PSA]: Secondary | ICD-10-CM

## 2016-10-09 DIAGNOSIS — Z0001 Encounter for general adult medical examination with abnormal findings: Secondary | ICD-10-CM

## 2016-10-09 DIAGNOSIS — E782 Mixed hyperlipidemia: Secondary | ICD-10-CM | POA: Diagnosis not present

## 2016-10-09 LAB — BASIC METABOLIC PANEL WITH GFR
BUN: 29 mg/dL — ABNORMAL HIGH (ref 7–25)
CALCIUM: 9.5 mg/dL (ref 8.6–10.3)
CO2: 27 mmol/L (ref 20–31)
Chloride: 102 mmol/L (ref 98–110)
Creat: 1.08 mg/dL (ref 0.70–1.18)
GFR, EST NON AFRICAN AMERICAN: 68 mL/min (ref >=60)
GFR, Est African American: 79 mL/min (ref >=60)
GLUCOSE: 80 mg/dL (ref 65–99)
Potassium: 5.1 mmol/L (ref 3.5–5.3)
SODIUM: 138 mmol/L (ref 135–146)

## 2016-10-09 LAB — LIPID PANEL
Cholesterol: 107 mg/dL (ref <200)
HDL: 57 mg/dL (ref >40)
LDL CALC: 36 mg/dL (ref <100)
Total CHOL/HDL Ratio: 1.9 Ratio (ref <5.0)
Triglycerides: 71 mg/dL (ref <150)
VLDL: 14 mg/dL (ref <30)

## 2016-10-09 LAB — CBC WITH DIFFERENTIAL/PLATELET
BASOS PCT: 1 %
Basophils Absolute: 94 cells/uL (ref 0–200)
EOS ABS: 282 {cells}/uL (ref 15–500)
Eosinophils Relative: 3 %
HEMATOCRIT: 41.3 % (ref 38.5–50.0)
Hemoglobin: 14.2 g/dL (ref 13.2–17.1)
Lymphocytes Relative: 18 %
Lymphs Abs: 1692 cells/uL (ref 850–3900)
MCH: 30.4 pg (ref 27.0–33.0)
MCHC: 34.4 g/dL (ref 32.0–36.0)
MCV: 88.4 fL (ref 80.0–100.0)
MONO ABS: 564 {cells}/uL (ref 200–950)
MPV: 10 fL (ref 7.5–12.5)
Monocytes Relative: 6 %
NEUTROS ABS: 6768 {cells}/uL (ref 1500–7800)
Neutrophils Relative %: 72 %
PLATELETS: 224 10*3/uL (ref 140–400)
RBC: 4.67 MIL/uL (ref 4.20–5.80)
RDW: 14.1 % (ref 11.0–15.0)
WBC: 9.4 10*3/uL (ref 3.8–10.8)

## 2016-10-09 LAB — HEPATIC FUNCTION PANEL
ALT: 17 U/L (ref 9–46)
AST: 18 U/L (ref 10–35)
Albumin: 4.2 g/dL (ref 3.6–5.1)
Alkaline Phosphatase: 65 U/L (ref 40–115)
BILIRUBIN DIRECT: 0.2 mg/dL (ref <=0.2)
BILIRUBIN INDIRECT: 0.5 mg/dL (ref 0.2–1.2)
Total Bilirubin: 0.7 mg/dL (ref 0.2–1.2)
Total Protein: 6.5 g/dL (ref 6.1–8.1)

## 2016-10-09 NOTE — Patient Instructions (Signed)
Please try simethicone or Gas X to see if this will help with gas and bloating after eating.  You can take this up to 3 times per day.  It is available over the counter.

## 2016-10-09 NOTE — Progress Notes (Signed)
MEDICARE ANNUAL WELLNESS VISIT AND FOLLOW UP Assessment:    1. ASHD (arteriosclerotic heart disease) -follows with Dr. Martinique -lipids and blood pressure at goal -diabetes at goal  2. Atherosclerosis of native coronary artery of native heart with unstable angina pectoris ([place]) -no recent complaints of chest pain -angina now stable  3. Essential hypertension -cont meds -currently well controlled -dash diet -medications refilled -exercise as tolerated  4. NSTEMI (non-ST elevated myocardial infarction) ([place]) -resolved after heart catheterization -followed by cardiology  5. Gastroesophageal reflux disease, esophagitis presence not specified -cont medications   6. T2_NIDDM w/ CKD -cont medications -consider cutting out sulfonyrueas - Hemoglobin A1c  7. Elevated PSA -followed by urology -cont PSA monitoring  8. Hyperlipidemia -cont statin -at goal - Lipid panel  9. Medication management  - CBC with Differential/Platelet - BASIC METABOLIC PANEL WITH GFR - Hepatic function panel  10. Vitamin D deficiency -cont Vit D  11. Medicare annual wellness visit, subsequent -due next year    Over 30 minutes of exam, counseling, chart review, and critical decision making was performed  Future Appointments Date Time Provider [place]  12/28/2016 2:45 PM Peter M Martinique, MD [facility]  01/16/2017 10:00 AM Unk Pinto, MD [facility] None     Plan:   During the course of the visit the patient was educated and counseled about appropriate screening and preventive services including:    Pneumococcal vaccine   Influenza vaccine  Prevnar 13  Td vaccine  Screening electrocardiogram  Colorectal cancer screening  Diabetes screening  Glaucoma screening  Nutrition counseling    Subjective:  Daniel Burnett is a 71 y.o. male who presents for Medicare Annual Wellness Visit and 3 month follow up for HTN, hyperlipidemia, Type 2 diabetes, and  vitamin D Def.   His blood pressure has been controlled at home, today their BP is BP: 128/70 He does not workout. He denies chest pain, shortness of breath, dizziness. He did go back to work and is practically doing the same thing he was doing before.  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     He is on cholesterol medication and denies myalgias. His cholesterol is at goal. The cholesterol last visit was:   Lab Results  Component Value Date   CHOL 104 (L) 06/29/2016   HDL 69 06/29/2016   LDLCALC 22 06/29/2016   TRIG 65 06/29/2016   CHOLHDL 1.5 06/29/2016   He has been doing really well with his blood sugars at home.  He has not had any morning blood sugars over 140.  He is taking his medication.  He is working on exercise and diet.  He is walking a lot currently for his job.     Lab Results  Component Value Date   HGBA1C 5.6 06/29/2016   Last GFR Lab Results  Component Value Date   GFRNONAA 51 (L) 06/29/2016     Lab Results  Component Value Date   GFRAA 59 (L) 06/29/2016   Patient is on Vitamin D supplement.   Lab Results  Component Value Date   VD25OH 33 06/29/2016     He reports that he is still having some reflux.  He reports that he has some trouble with gas and belching.  He reports that the zantac doesn't help as much as he would like.  He feels like pepto bismol helps more.     Medication Review: Current Outpatient Prescriptions on File Prior to  Visit  Medication Sig Dispense Refill  . aspirin 81 MG tablet Take 81 mg by mouth daily.    Marland Kitchen atenolol (TENORMIN) 25 MG tablet Take 1/2 to 1 tablet daily as directed. 90 tablet 1  . benazepril (LOTENSIN) 40 MG tablet TAKE 1 TABLET BY MOUTH EVERY DAY FOR BLOOD PRESSURE 90 tablet 1  . BRILINTA 90 MG TABS tablet TAKE 1 TABLET (90 MG TOTAL) BY MOUTH 2 (TWO) TIMES DAILY. 60 tablet 0  . Cholecalciferol (VITAMIN D) 2000 UNITS CAPS Take 2,000 Units by mouth 2 (two) times daily.     Marland Kitchen  glipiZIDE (GLUCOTROL) 5 MG tablet TAKE 1/2 TO 1 TABLET BY MOUTH TWICE DAILY WITH MEALS 180 tablet 1  . magnesium gluconate (MAGONATE) 500 MG tablet Take 500 mg by mouth daily.    . metFORMIN (GLUCOPHAGE-XR) 500 MG 24 hr tablet TAKE 1-2 TABLETS TWICE A DAY FOR DIABETES 360 tablet 1  . nitroGLYCERIN (NITROSTAT) 0.4 MG SL tablet Place 1 tablet (0.4 mg total) under the tongue every 5 (five) minutes x 3 doses as needed for chest pain. 25 tablet 2  . Omega-3 Fatty Acids (FISH OIL PO) Take 1 capsule by mouth 2 (two) times daily.     . ONE TOUCH ULTRA TEST test strip USE ONCE A DAY AS DIRECTED 100 each 99  . ranitidine (ZANTAC) 300 MG tablet Take 1 to 2 tablets daily for heartburn & reflux 180 tablet 3  . rosuvastatin (CRESTOR) 20 MG tablet Take 0.5 tablets (10 mg total) by mouth daily. 90 tablet 3   No current facility-administered medications on file prior to visit.     Allergies: Allergies  Allergen Reactions  . Ppd [Tuberculin Purified Protein Derivative] Other (See Comments)    Positive tb test.  . Tricor [Fenofibrate] Other (See Comments)    GI upset    Current Problems (verified) has Essential hypertension; Hyperlipidemia; Vitamin D deficiency; Medication management; T2_NIDDM w/ CKD; Elevated PSA; Medicare annual wellness visit, subsequent; NSTEMI (non-ST elevated myocardial infarction) ([place]); Atherosclerosis of native coronary artery of native heart with unstable angina pectoris ([place]); ASHD (arteriosclerotic heart disease); and Esophageal reflux on his problem list.  Screening Tests Immunization History  Administered Date(s) Administered  . DT 02/09/2014  . DTaP 01/06/2005  . Influenza Whole 09/18/2012  . Influenza, High Dose Seasonal PF 06/29/2016  . Pneumococcal Conjugate-13 06/29/2016  . Pneumococcal Polysaccharide-23 09/18/2012    Preventative care: Last colonoscopy: 2008, will order cologuard Prior vaccinations: TD or Tdap: 2015  Influenza: 2017  Pneumococcal:  2017 Prevnar13: 2013 Shingles/Zostavax: Declined  Names of Other Physician/Practitioners you currently use: 1. [facility] here for primary care 2. Dr. Delman Cheadle, eye doctor, last visit 2016 3. [facility] [address] , dentist, last visit 2016 Patient Care Team: Unk Pinto, MD as PCP - General (Internal Medicine) Melissa Noon, [place] as Referring Physician (Optometry) Teena Irani, MD as Consulting Physician (Gastroenterology)  Surgical: He  has a past surgical history that includes Cystoscopy w/ retrogrades (1991); Flexible sigmoidoscopy (1993); Squamous cell carcinoma excision (2001); Cardiac catheterization (N/A, 09/27/2015); and Cardiac catheterization (N/A, 09/27/2015). Family His family history includes Cancer in his cousin; Diabetes in his maternal aunt; Heart attack in his father and mother; Heart disease in his brother, father, and mother; Hypertension in his brother; Leukemia in his cousin; Stroke in his brother. Social history  He reports that he quit smoking about 54 years ago. He has never used smokeless tobacco. He reports that he does not drink alcohol or  use drugs.  MEDICARE WELLNESS OBJECTIVES: Physical activity: Current Exercise Habits: The patient has a physically strenous job, but has no regular exercise apart from work. Cardiac risk factors: Cardiac Risk Factors include: advanced age (>33men, >57 women);hypertension;male gender;obesity (BMI >30kg/m2) Depression/mood screen:   Depression screen [facility] 2/9 10/09/2016  Decreased Interest 0  Down, Depressed, Hopeless 0  PHQ - 2 Score 0    ADLs:  In your present state of health, do you have any difficulty performing the following activities: 10/09/2016 06/29/2016  Hearing? N N  Vision? N N  Difficulty concentrating or making decisions? N N  Walking or climbing stairs? N N  Dressing or bathing? N N  Doing errands, shopping? N N  Preparing Food and eating ? N -  Using the Toilet? N -  In the past  six months, have you accidently leaked urine? N -  Do you have problems with loss of bowel control? N -  Managing your Medications? N -  Managing your Finances? N -  Housekeeping or managing your Housekeeping? N -  Some recent data might be hidden     Cognitive Testing  Alert? Yes  Normal Appearance?Yes  Oriented to person? Yes  Place? Yes   Time? Yes  Recall of three objects?  Yes  Can perform simple calculations? Yes  Displays appropriate judgment?Yes  Can read the correct time from a watch face?Yes  EOL planning: Does Patient Have a Medical Advance Directive?: No Would patient like information on creating a medical advance directive?: Yes (MAU/Ambulatory/Procedural Areas - Information given)   Objective:   Today's Vitals   10/09/16 0929  BP: 128/70  Pulse: (!) 54  Resp: 16  Temp: 98.4 F (36.9 C)  TempSrc: Temporal  Weight: 180 lb (81.6 kg)  Height: 5\' 10"  (1.778 m)   Body mass index is 25.83 kg/m.  General appearance: alert, no distress, WD/WN, male HEENT: normocephalic, sclerae anicteric, TMs pearly, nares patent, no discharge or erythema, pharynx normal Oral cavity: MMM, no lesions Neck: supple, no lymphadenopathy, no thyromegaly, no masses Heart: RRR, normal S1, S2, no murmurs Lungs: CTA bilaterally, no wheezes, rhonchi, or rales Abdomen: +bs, soft, non tender, non distended, no masses, no hepatomegaly, no splenomegaly Musculoskeletal: nontender, no swelling, no obvious deformity Extremities: no edema, no cyanosis, no clubbing Pulses: 2+ symmetric, upper and lower extremities, normal cap refill Neurological: alert, oriented x 3, CN2-12 intact, strength normal upper extremities and lower extremities, sensation normal throughout, DTRs 2+ throughout, no cerebellar signs, gait normal Psychiatric: normal affect, behavior normal, pleasant   Medicare Attestation I have personally reviewed: The patient's medical and social history Their use of alcohol, tobacco or  illicit drugs Their current medications and supplements The patient's functional ability including ADLs,fall risks, home safety risks, cognitive, and hearing and visual impairment Diet and physical activities Evidence for depression or mood disorders  The patient's weight, height, BMI, and visual acuity have been recorded in the chart.  I have made referrals, counseling, and provided education to the patient based on review of the above and I have provided the patient with a written personalized care plan for preventive services.     Daniel Skeans, PA-C   10/09/2016

## 2016-10-10 LAB — HEMOGLOBIN A1C
Hgb A1c MFr Bld: 5.9 % — ABNORMAL HIGH (ref <5.7)
MEAN PLASMA GLUCOSE: 123 mg/dL

## 2016-11-20 ENCOUNTER — Other Ambulatory Visit: Payer: Self-pay | Admitting: Internal Medicine

## 2016-11-20 DIAGNOSIS — K219 Gastro-esophageal reflux disease without esophagitis: Secondary | ICD-10-CM

## 2016-12-27 NOTE — Progress Notes (Signed)
Cardiology Office Note   Date:  12/28/2016   ID:  Daniel Burnett, DOB [birth year], MRN [id number]  PCP:  Alesia Richards, MD  Cardiologist:  Brea Coleson Martinique, MD   Chief Complaint  Patient presents with  . Follow-up    6 months  . Coronary Artery Disease    History of Present Illness: Daniel Burnett is a 71 y.o. male with a history of HTN, HL, NIDDM, CKD III, BPH, d/c 09/29/15 after NSTEMI w/ DES x 2 LAD & DES OM2. Med rx for 70% RCA, EF 55-60% by echo.  When seen back for initial follow up visit Mr. Daniel Burnett complained of a lot of GI issues and indigestion. This improved somewhat with Zantac. He states this has been a chronic problem. He still notes some indigestion. This is sporadic and not related to activity. He is working on a job now and hasn't been exercising as much. His energy level is good. No dyspnea. He is planning to work another 2-3 months then retire. Reports recent eye exam was normal.   Past Medical History:  Diagnosis Date  . BPH (benign prostatic hyperplasia)   . CAD (coronary artery disease)    a. Cath 09/2015: 100% stenosis Prox LAD --> 2 DES placed, 90% D2, 99% 2nd Mrg --> DES placed, 70% RCA --> Medically manage  . Colitis   . Diabetes mellitus without complication ([place])   . ED (erectile dysfunction)   . GERD (gastroesophageal reflux disease)   . Hyperlipidemia   . Hypertension   . Kidney stone   . Vitamin D deficiency     Past Surgical History:  Procedure Laterality Date  . CARDIAC CATHETERIZATION N/A 09/27/2015   Procedure: Left Heart Cath and Coronary Angiography;  Surgeon: Leonie Man, MD;  Location: [facility];  Service: Cardiovascular;  Laterality: N/A;  . CARDIAC CATHETERIZATION N/A 09/27/2015   Procedure: Coronary Stent Intervention;  Surgeon: Leonie Man, MD;  Location: [place] [facility];  Service: Cardiovascular;  Laterality: N/A;  Lad/OM2  . CYSTOSCOPY W/ RETROGRADES  1991   stone extractions  . [place]   negative  . SQUAMOUS CELL CARCINOMA EXCISION  2001   bridge of nose    Current Outpatient Prescriptions  Medication Sig Dispense Refill  . aspirin 81 MG tablet Take 81 mg by mouth daily.    Marland Kitchen atenolol (TENORMIN) 25 MG tablet TAKE 1/2 TO 1 TABLET BY MOUTH DAILY AS DIRECTED 90 tablet 1  . benazepril (LOTENSIN) 40 MG tablet TAKE 1 TABLET BY MOUTH EVERY DAY FOR BLOOD PRESSURE 90 tablet 1  . Cholecalciferol (VITAMIN D) 2000 UNITS CAPS Take 2,000 Units by mouth 2 (two) times daily.     Marland Kitchen glipiZIDE (GLUCOTROL) 5 MG tablet TAKE 1/2 TO 1 TABLET BY MOUTH TWICE DAILY WITH MEALS 180 tablet 1  . magnesium gluconate (MAGONATE) 500 MG tablet Take 500 mg by mouth daily.    . metFORMIN (GLUCOPHAGE) 500 MG tablet TAKE 1 TO 2 TABLETS BY MOUTH TWICE A DAY FOR DIABETES 360 tablet 1  . nitroGLYCERIN (NITROSTAT) 0.4 MG SL tablet Place 1 tablet (0.4 mg total) under the tongue every 5 (five) minutes x 3 doses as needed for chest pain. 25 tablet 2  . Omega-3 Fatty Acids (FISH OIL PO) Take 1 capsule by mouth 2 (two) times daily.     . ONE TOUCH ULTRA TEST test strip USE ONCE A DAY AS DIRECTED 100 each 99  . ranitidine (ZANTAC)  300 MG tablet TAKE 1 TO 2 TABLETS DAILY FOR HEARTBURN & REFLUX 180 tablet 2  . rosuvastatin (CRESTOR) 20 MG tablet Take 0.5 tablets (10 mg total) by mouth daily. 90 tablet 3   No current facility-administered medications for this visit.     Allergies:   Ppd [tuberculin purified protein derivative] and Tricor [fenofibrate]    Social History:  The patient  reports that he quit smoking about 54 years ago. He has never used smokeless tobacco. He reports that he does not drink alcohol or use drugs.   Family History:  The patient's family history includes Cancer in his cousin; Diabetes in his maternal aunt; Heart attack in his father and mother; Heart disease in his brother, father, and mother; Hypertension in his brother; Leukemia in his cousin; Stroke in his brother.     ROS:  Please see the history of present illness. All other systems are reviewed and negative.    PHYSICAL EXAM: VS:  BP 132/68   Pulse (!) 49   Ht 5\' 10"  (1.778 m)   Wt 181 lb (82.1 kg)   BMI 25.97 kg/m  , BMI Body mass index is 25.97 kg/m. GEN: Well nourished, well developed, male in no acute distress  HEENT: normal for age  Neck: no JVD, no carotid bruit, no masses Cardiac: RRR; normal U3-1. 4-9/7 systolic murmur at the LLSB. No gallop.  Respiratory:  clear to auscultation bilaterally, normal work of breathing GI: soft, nontender, nondistended, + BS MS: no deformity or atrophy; no edema; distal pulses are 2+ in all 4 extremities;  Distal pulses 2+.   Skin: warm and dry, no rash Neuro:  Strength and sensation are intact Psych: euthymic mood, full affect   EKG:  EKG is  ordered today. Sinus bradycardia rate 49. Otherwise normal. I have personally reviewed and interpreted this study.   Recent Labs: 06/29/2016: Magnesium 2.0; TSH 1.76 10/09/2016: ALT 17; BUN 29; Creat 1.08; Hemoglobin 14.2; Platelets 224; Potassium 5.1; Sodium 138    Lipid Panel    Component Value Date/Time   CHOL 107 10/09/2016 1004   TRIG 71 10/09/2016 1004   HDL 57 10/09/2016 1004   CHOLHDL 1.9 10/09/2016 1004   VLDL 14 10/09/2016 1004   LDLCALC 36 10/09/2016 1004     Wt Readings from Last 3 Encounters:  12/28/16 181 lb (82.1 kg)  10/09/16 180 lb (81.6 kg)  07/06/16 176 lb (79.8 kg)     Other studies Reviewed: Additional studies/ records that were reviewed today include: none  ASSESSMENT AND PLAN:  1.  CAD s/p NSTEMI in December 2016. Extensive stenting of the LAD and first OM. Residual 70% disease in the distal RCA. He is asymptomatic. He is on appropriate therapy with aspirin, beta blocker, ACE inhibitor and statin. Continue lifestyle modification. He is no longer on Brilinta. He does have chronic indigestion. I told him to let me know if there is any change in frequency or severity of his  symptoms.  2. Hyperlipidemia. Excellent control on Crestor.   3. HTN well controlled.  4. DM type 2 with CKD. Followed by primary care.    Current medicines are reviewed at length with the patient today.  The patient has no concerns regarding medicines.  The following changes have been made: None  Labs/ tests ordered today include:   Orders Placed This Encounter  Procedures  . EKG 12-Lead    Disposition:   FU with Dr. Martinique in 6 months.  Signed, Phinehas Grounds Martinique, MD  12/28/2016 3:24 PM    [facility]

## 2016-12-28 ENCOUNTER — Encounter: Payer: Self-pay | Admitting: Cardiology

## 2016-12-28 ENCOUNTER — Ambulatory Visit (INDEPENDENT_AMBULATORY_CARE_PROVIDER_SITE_OTHER): Payer: PPO | Admitting: Cardiology

## 2016-12-28 VITALS — BP 132/68 | HR 49 | Ht 70.0 in | Wt 181.0 lb

## 2016-12-28 DIAGNOSIS — I25110 Atherosclerotic heart disease of native coronary artery with unstable angina pectoris: Secondary | ICD-10-CM

## 2016-12-28 DIAGNOSIS — I10 Essential (primary) hypertension: Secondary | ICD-10-CM | POA: Diagnosis not present

## 2016-12-28 DIAGNOSIS — E782 Mixed hyperlipidemia: Secondary | ICD-10-CM | POA: Diagnosis not present

## 2016-12-28 DIAGNOSIS — E119 Type 2 diabetes mellitus without complications: Secondary | ICD-10-CM | POA: Diagnosis not present

## 2016-12-28 NOTE — Patient Instructions (Signed)
Continue your current therapy  I will see you in 6 months.   

## 2016-12-31 ENCOUNTER — Encounter: Payer: Self-pay | Admitting: Internal Medicine

## 2017-01-15 NOTE — Progress Notes (Signed)
[facility]   Unk Pinto, M.D.    Uvaldo Bristle. Silverio Lay, P.A.-C      Starlyn Skeans, P.A.-C  [facility]                [address] [place], [place] [address] Telephone [phone] Telefax [phone] Annual  Screening/Preventative Visit  & Comprehensive Evaluation & Examination     This very nice [name] presents for a Screening/Preventative Visit & comprehensive evaluation and management of multiple medical co-morbidities.  Patient has been followed for HTN, ASCAD,  T2_NIDDM, Hyperlipidemia and Vitamin D Deficiency.     HTN predates since 1995. Patient's BP has been controlled at home.  Patient was hospitalized in Dec 2016 with a NSTEMI and underwent PCA/DES x 2 by Dr Ellyn Hack and he's followed by Dr Martinique. Today's BP is at goal - 130/64. Patient denies any cardiac symptoms as chest pain, palpitations, shortness of breath, dizziness or ankle swelling.     Patient's hyperlipidemia is controlled with diet and medications. Patient denies myalgias or other medication SE's. Last lipids were  Lab Results  Component Value Date   CHOL 107 10/09/2016   HDL 57 10/09/2016   LDLCALC 36 10/09/2016   TRIG 71 10/09/2016   CHOLHDL 1.9 10/09/2016      Patient has T2_NIDDM (2002) with CKD 3  and patient denies reactive hypoglycemic symptoms, visual blurring, diabetic polys or paresthesias. He reports CBG's are usu less than 110 mg%. Last A1c was  Lab Results  Component Value Date   HGBA1C 5.9 (H) 10/09/2016       Finally, patient has history of Vitamin D Deficiency ("19" in 2008) and last vitamin D was  Lab Results  Component Value Date   VD25OH 83 06/29/2016   Current Outpatient Prescriptions on File Prior to Visit  Medication Sig  . aspirin 81 MG tablet Take 81 mg by mouth daily.  Marland Kitchen atenolol (TENORMIN) 25 MG tablet TAKE 1/2 TO 1 TABLET BY MOUTH DAILY AS DIRECTED  . benazepril (LOTENSIN) 40 MG tablet  TAKE 1 TABLET BY MOUTH EVERY DAY FOR BLOOD PRESSURE  . Cholecalciferol (VITAMIN D) 2000 UNITS CAPS Take 2,000 Units by mouth 2 (two) times daily.   Marland Kitchen glipiZIDE  5 MG tablet TAKE 1/2 TO 1 TABLET BY MOUTH TWICE DAILY WITH MEALS  . magnesium  500 MG tablet Take 500 mg by mouth daily.  . metFORMIN  500 MG tablet TAKE 1 TO 2 TABLETS BY MOUTH TWICE A DAY FOR DIABETES  . NITROSTAT 0.4 MG SL tablet as needed for chest pain.  . Omega-3 FISH OIL  Take 1 capsule by mouth 2 (two) times daily.   . Ranitidine 300 MG tablet TAKE 1 TO 2 TABLETS DAILY FOR HEARTBURN & REFLUX  . rosuvastatin  20 MG tablet Take 0.5 tablets (10 mg total) by mouth daily.   Allergies  Allergen Reactions  . Ppd [Tuberculin Purified Protein Derivative] Other (See Comments)    Positive tb test.  . Tricor [Fenofibrate] Other (See Comments)    GI upset   Past Medical History:  Diagnosis Date  . BPH (benign prostatic hyperplasia)   . CAD (coronary artery disease)    a. Cath 09/2015: 100% stenosis Prox LAD --> 2 DES placed, 90% D2, 99% 2nd Mrg --> DES placed, 70% RCA --> Medically manage  . Colitis   . Diabetes mellitus  without complication ([place])   . ED (erectile dysfunction)   . GERD (gastroesophageal reflux disease)   . Hyperlipidemia   . Hypertension   . Kidney stone   . Vitamin D deficiency    Health Maintenance  Topic Date Due  . Hepatitis C Screening  1946-09-01  . FOOT EXAM  12/08/2016  . HEMOGLOBIN A1C  04/08/2017  . INFLUENZA VACCINE  05/08/2017  . COLONOSCOPY  06/08/2017  . OPHTHALMOLOGY EXAM  12/28/2017  . TETANUS/TDAP  02/10/2024  . PNA vac Low Risk Adult  Completed   Immunization History  Administered Date(s) Administered  . DT 02/09/2014  . DTaP 01/06/2005  . Influenza Whole 09/18/2012  . Influenza, High Dose Seasonal PF 06/29/2016  . Pneumococcal Conjugate-13 06/29/2016  . Pneumococcal Polysaccharide-23 09/18/2012   Past Surgical History:  Procedure Laterality Date  . CARDIAC CATHETERIZATION N/A  09/27/2015   Procedure: Left Heart Cath and Coronary Angiography;  Surgeon: Leonie Man, MD;  Location: [facility];  Service: Cardiovascular;  Laterality: N/A;  . CARDIAC CATHETERIZATION N/A 09/27/2015   Procedure: Coronary Stent Intervention;  Surgeon: Leonie Man, MD;  Location: [facility];  Service: Cardiovascular;  Laterality: N/A;  Lad/OM2  . CYSTOSCOPY W/ RETROGRADES  1991   stone extractions  . E. Lopez   negative  . SQUAMOUS CELL CARCINOMA EXCISION  2001   bridge of nose   Family History  Problem Relation Age of Onset  . Heart disease Mother   . Heart attack Mother   . Heart disease Father   . Heart attack Father   . Heart disease Brother   . Stroke Brother   . Hypertension Brother   . Diabetes Maternal Aunt   . Cancer Cousin   . Leukemia Cousin    Social History   Social History  . Marital status: Married    Spouse name: N/A  . Number of children: N/A  . Years of education: N/A   Occupational History  . Retired.   Social History Main Topics  . Smoking status: Former Smoker    Quit date: 10/08/1962  . Smokeless tobacco: Never Used  . Alcohol use No  . Drug use: No  . Sexual activity: Not on file    ROS Constitutional: Denies fever, chills, weight loss/gain, headaches, insomnia,  night sweats or change in appetite. Does c/o fatigue. Eyes: Denies redness, blurred vision, diplopia, discharge, itchy or watery eyes.  ENT: Denies discharge, congestion, post nasal drip, epistaxis, sore throat, earache, hearing loss, dental pain, Tinnitus, Vertigo, Sinus pain or snoring.  Cardio: Denies chest pain, palpitations, irregular heartbeat, syncope, dyspnea, diaphoresis, orthopnea, PND, claudication or edema Respiratory: denies cough, dyspnea, DOE, pleurisy, hoarseness, laryngitis or wheezing.  Gastrointestinal: Denies dysphagia, heartburn, reflux, water brash, pain, cramps, nausea, vomiting, bloating, diarrhea, constipation,  hematemesis, melena, hematochezia, jaundice or hemorrhoids Genitourinary: Denies dysuria, frequency, urgency, nocturia, hesitancy, discharge, hematuria or flank pain Musculoskeletal: Denies arthralgia, myalgia, stiffness, Jt. Swelling, pain, limp or strain/sprain. Denies Falls. Skin: Denies puritis, rash, hives, warts, acne, eczema or change in skin lesion Neuro: No weakness, tremor, incoordination, spasms, paresthesia or pain Psychiatric: Denies confusion, memory loss or sensory loss. Denies Depression. Endocrine: Denies change in weight, skin, hair change, nocturia, and paresthesia, diabetic polys, visual blurring or hyper / hypo glycemic episodes.  Heme/Lymph: No excessive bleeding, bruising or enlarged lymph nodes.  Physical Exam  BP 130/64   Pulse (!) 52   Temp 97.4 F (36.3 C)   Resp 16  Ht 5\' 9"  (1.753 m)   Wt 179 lb 12.8 oz (81.6 kg)   BMI 26.55 kg/m   General Appearance: Well nourished and well groomed and in no apparent distress.  Eyes: PERRLA, EOMs, conjunctiva no swelling or erythema, normal fundi and vessels. Sinuses: No frontal/maxillary tenderness ENT/Mouth: EACs patent / TMs  nl. Nares clear without erythema, swelling, mucoid exudates. Oral hygiene is good. No erythema, swelling, or exudate. Tongue normal, non-obstructing. Tonsils not swollen or erythematous. Hearing normal.  Neck: Supple, thyroid normal. No bruits, nodes or JVD. Respiratory: Respiratory effort normal.  BS equal and clear bilateral without rales, rhonci, wheezing or stridor. Cardio: Heart sounds are normal with regular rate and rhythm and no murmurs, rubs or gallops. Peripheral pulses are normal and equal bilaterally without edema. No aortic or femoral bruits. Chest: symmetric with normal excursions and percussion.  Abdomen: Soft, with Nl bowel sounds. Nontender, no guarding, rebound, hernias, masses, or organomegaly.  Lymphatics: Non tender without lymphadenopathy.  Genitourinary: No hernias.Testes  nl. DRE - prostate nl for age - smooth & firm w/o nodules. Musculoskeletal: Full ROM all peripheral extremities, joint stability, 5/5 strength, and normal gait. Skin: Warm and dry without rashes, lesions, cyanosis, clubbing or  ecchymosis.  Neuro: Cranial nerves intact, reflexes equal bilaterally. Normal muscle tone, no cerebellar symptoms. Sensation intact to touch, vibratory and Monofilament to the toes bilaterally.  Pysch: Alert and oriented X 3 with normal affect, insight and judgment appropriate.   Assessment and Plan  1. Annual Preventative/Screening Exam    2. Essential hypertension  - EKG 12-Lead - [place], RETROPERITNL ABD,  LTD - Urinalysis, Routine w reflex microscopic - Microalbumin / creatinine urine ratio - CBC with Differential/Platelet - BASIC METABOLIC PANEL WITH GFR - Magnesium - TSH  3. Mixed hyperlipidemia  - EKG 12-Lead - [place], RETROPERITNL ABD,  LTD - Hepatic function panel - Lipid panel - TSH  4. Type 2 diabetes mellitus with stage 3 chronic kidney disease, without long-term current use of insulin (HCC)  - EKG 12-Lead - [place], RETROPERITNL ABD,  LTD - Microalbumin / creatinine urine ratio - HM DIABETES FOOT EXAM - LOW EXTREMITY NEUR EXAM DOCUM - Hemoglobin A1c - Insulin, random  5. Vitamin D deficiency  - VITAMIN D 25 Hydroxy   6. ASHD (arteriosclerotic heart disease)  - EKG 12-Lead  7. Prostate cancer screening  - PSA  8. Screening for rectal cancer  - POC Hemoccult Bld/Stl   9. Elevated PSA   10. Gastroesophageal reflux disease   11. Screening for ischemic heart disease  - EKG 12-Lead  12. Screening for AAA (aortic abdominal aneurysm)  - [place], RETROPERITNL ABD,  LTD  13. Medication management  - Urinalysis, Routine w reflex microscopic - CBC with Differential/Platelet - BASIC METABOLIC PANEL WITH GFR - Hepatic function panel - Magnesium - Lipid panel - TSH - Hemoglobin A1c - Insulin, random - VITAMIN D 25 Hydroxy         Patient was counseled in prudent diet, weight contro to achieve/maintain BMI less than 25, BP monitoring, regular exercise and medications as discussed.  Discussed med effects and SE's. Routine screening labs and tests as requested with regular follow-up as recommended. Over 40 minutes of exam, counseling, chart review and high complex critical decision making was performed

## 2017-01-15 NOTE — Patient Instructions (Signed)

## 2017-01-16 ENCOUNTER — Encounter: Payer: Self-pay | Admitting: Internal Medicine

## 2017-01-16 ENCOUNTER — Ambulatory Visit (INDEPENDENT_AMBULATORY_CARE_PROVIDER_SITE_OTHER): Payer: PPO | Admitting: Internal Medicine

## 2017-01-16 VITALS — BP 130/64 | HR 52 | Temp 97.4°F | Resp 16 | Ht 69.0 in | Wt 179.8 lb

## 2017-01-16 DIAGNOSIS — Z136 Encounter for screening for cardiovascular disorders: Secondary | ICD-10-CM

## 2017-01-16 DIAGNOSIS — E782 Mixed hyperlipidemia: Secondary | ICD-10-CM | POA: Diagnosis not present

## 2017-01-16 DIAGNOSIS — R972 Elevated prostate specific antigen [PSA]: Secondary | ICD-10-CM

## 2017-01-16 DIAGNOSIS — N183 Chronic kidney disease, stage 3 unspecified: Secondary | ICD-10-CM

## 2017-01-16 DIAGNOSIS — R6889 Other general symptoms and signs: Secondary | ICD-10-CM

## 2017-01-16 DIAGNOSIS — E1122 Type 2 diabetes mellitus with diabetic chronic kidney disease: Secondary | ICD-10-CM | POA: Diagnosis not present

## 2017-01-16 DIAGNOSIS — I2510 Atherosclerotic heart disease of native coronary artery without angina pectoris: Secondary | ICD-10-CM

## 2017-01-16 DIAGNOSIS — Z125 Encounter for screening for malignant neoplasm of prostate: Secondary | ICD-10-CM

## 2017-01-16 DIAGNOSIS — N320 Bladder-neck obstruction: Secondary | ICD-10-CM | POA: Diagnosis not present

## 2017-01-16 DIAGNOSIS — E559 Vitamin D deficiency, unspecified: Secondary | ICD-10-CM | POA: Diagnosis not present

## 2017-01-16 DIAGNOSIS — K219 Gastro-esophageal reflux disease without esophagitis: Secondary | ICD-10-CM

## 2017-01-16 DIAGNOSIS — I10 Essential (primary) hypertension: Secondary | ICD-10-CM | POA: Diagnosis not present

## 2017-01-16 DIAGNOSIS — Z0001 Encounter for general adult medical examination with abnormal findings: Secondary | ICD-10-CM | POA: Diagnosis not present

## 2017-01-16 DIAGNOSIS — Z79899 Other long term (current) drug therapy: Secondary | ICD-10-CM

## 2017-01-16 DIAGNOSIS — Z1212 Encounter for screening for malignant neoplasm of rectum: Secondary | ICD-10-CM

## 2017-01-16 LAB — BASIC METABOLIC PANEL WITH GFR
BUN: 23 mg/dL (ref 7–25)
CALCIUM: 9.8 mg/dL (ref 8.6–10.3)
CO2: 29 mmol/L (ref 20–31)
CREATININE: 1.39 mg/dL — AB (ref 0.70–1.18)
Chloride: 101 mmol/L (ref 98–110)
GFR, EST AFRICAN AMERICAN: 58 mL/min — AB (ref >=60)
GFR, Est Non African American: 50 mL/min — ABNORMAL LOW (ref >=60)
Glucose, Bld: 70 mg/dL (ref 65–99)
Potassium: 4.6 mmol/L (ref 3.5–5.3)
Sodium: 138 mmol/L (ref 135–146)

## 2017-01-16 LAB — HEPATIC FUNCTION PANEL
ALBUMIN: 4.2 g/dL (ref 3.6–5.1)
ALT: 21 U/L (ref 9–46)
AST: 19 U/L (ref 10–35)
Alkaline Phosphatase: 59 U/L (ref 40–115)
BILIRUBIN TOTAL: 0.7 mg/dL (ref 0.2–1.2)
Bilirubin, Direct: 0.2 mg/dL (ref <=0.2)
Indirect Bilirubin: 0.5 mg/dL (ref 0.2–1.2)
Total Protein: 6.4 g/dL (ref 6.1–8.1)

## 2017-01-16 LAB — LIPID PANEL
CHOLESTEROL: 104 mg/dL (ref <200)
HDL: 58 mg/dL (ref >40)
LDL Cholesterol: 30 mg/dL (ref <100)
TRIGLYCERIDES: 82 mg/dL (ref <150)
Total CHOL/HDL Ratio: 1.8 Ratio (ref <5.0)
VLDL: 16 mg/dL (ref <30)

## 2017-01-16 LAB — CBC WITH DIFFERENTIAL/PLATELET
BASOS ABS: 96 {cells}/uL (ref 0–200)
Basophils Relative: 1 %
EOS ABS: 480 {cells}/uL (ref 15–500)
Eosinophils Relative: 5 %
HCT: 42.6 % (ref 38.5–50.0)
Hemoglobin: 14.3 g/dL (ref 13.2–17.1)
LYMPHS PCT: 22 %
Lymphs Abs: 2112 cells/uL (ref 850–3900)
MCH: 29.7 pg (ref 27.0–33.0)
MCHC: 33.6 g/dL (ref 32.0–36.0)
MCV: 88.4 fL (ref 80.0–100.0)
MPV: 9.8 fL (ref 7.5–12.5)
Monocytes Absolute: 960 cells/uL — ABNORMAL HIGH (ref 200–950)
Monocytes Relative: 10 %
Neutro Abs: 5952 cells/uL (ref 1500–7800)
Neutrophils Relative %: 62 %
PLATELETS: 230 10*3/uL (ref 140–400)
RBC: 4.82 MIL/uL (ref 4.20–5.80)
RDW: 13.8 % (ref 11.0–15.0)
WBC: 9.6 10*3/uL (ref 3.8–10.8)

## 2017-01-16 LAB — MAGNESIUM: MAGNESIUM: 2.1 mg/dL (ref 1.5–2.5)

## 2017-01-16 LAB — TSH: TSH: 1.79 mIU/L (ref 0.40–4.50)

## 2017-01-17 LAB — URINALYSIS, MICROSCOPIC ONLY
Bacteria, UA: NONE SEEN [HPF]
Casts: NONE SEEN [LPF]
Crystals: NONE SEEN [HPF]
SQUAMOUS EPITHELIAL / LPF: NONE SEEN [HPF] (ref <=5)
WBC UA: NONE SEEN WBC/HPF (ref <=5)
Yeast: NONE SEEN [HPF]

## 2017-01-17 LAB — URINALYSIS, ROUTINE W REFLEX MICROSCOPIC
Bilirubin Urine: NEGATIVE
Glucose, UA: NEGATIVE
Ketones, ur: NEGATIVE
LEUKOCYTES UA: NEGATIVE
NITRITE: NEGATIVE
PROTEIN: NEGATIVE
Specific Gravity, Urine: 1.014 (ref 1.001–1.035)
pH: 8 (ref 5.0–8.0)

## 2017-01-17 LAB — HEMOGLOBIN A1C
Hgb A1c MFr Bld: 6 % — ABNORMAL HIGH (ref <5.7)
Mean Plasma Glucose: 126 mg/dL

## 2017-01-17 LAB — INSULIN, RANDOM: INSULIN: 3.2 u[IU]/mL (ref 2.0–19.6)

## 2017-01-17 LAB — VITAMIN D 25 HYDROXY (VIT D DEFICIENCY, FRACTURES): VIT D 25 HYDROXY: 90 ng/mL (ref 30–100)

## 2017-01-17 LAB — MICROALBUMIN / CREATININE URINE RATIO
Creatinine, Urine: 96 mg/dL (ref 20–370)
MICROALB UR: 0.7 mg/dL
Microalb Creat Ratio: 7 mcg/mg creat (ref <30)

## 2017-01-17 LAB — PSA: PSA: 2.6 ng/mL (ref <=4.0)

## 2017-01-30 ENCOUNTER — Other Ambulatory Visit: Payer: Self-pay | Admitting: Internal Medicine

## 2017-02-06 ENCOUNTER — Encounter: Payer: Self-pay | Admitting: Internal Medicine

## 2017-03-11 DIAGNOSIS — T1501XA Foreign body in cornea, right eye, initial encounter: Secondary | ICD-10-CM | POA: Diagnosis not present

## 2017-03-13 DIAGNOSIS — T1501XD Foreign body in cornea, right eye, subsequent encounter: Secondary | ICD-10-CM | POA: Diagnosis not present

## 2017-03-31 ENCOUNTER — Other Ambulatory Visit: Payer: Self-pay | Admitting: Internal Medicine

## 2017-04-25 DIAGNOSIS — I700 Atherosclerosis of aorta: Secondary | ICD-10-CM | POA: Insufficient documentation

## 2017-04-25 NOTE — Progress Notes (Signed)
Assessment and Plan:   Essential hypertension - continue medications, DASH diet, exercise and monitor at home. Call if greater than 130/80.  -     CBC with Differential/Platelet -     Hepatic function panel -     BASIC METABOLIC PANEL WITH GFR -     TSH  Atherosclerosis of native coronary artery of native heart with unstable angina pectoris (HCC) Control blood pressure, cholesterol, glucose, increase exercise.  -     Lipid panel -     Hemoglobin A1c  T2_NIDDM w/ CKD Discussed general issues about diabetes pathophysiology and management., Educational material distributed., Suggested low cholesterol diet., Encouraged aerobic exercise., Discussed foot care., Reminded to get yearly retinal exam. -     Hemoglobin A1c  Hyperlipidemia -continue medications, check lipids, decrease fatty foods, increase activity.  -     Lipid panel  Medication management -     Magnesium  Thoracic aorta atherosclerosis (HCC) Control blood pressure, cholesterol, glucose, increase exercise.   Generalized abdominal pain Benign AB, check lab, avoid lactose, stop metformin x 1-2 weeks to see if resolved, if any worsening issues call office or go to ER, ? From stress from being back at work, add probiotic.   Continue diet and meds as discussed. Further disposition pending results of labs. Discussed med's effects and SE's.    Over 30 minutes of exam, counseling, chart review, and critical decision making was performed Future Appointments Date Time Provider [place]  07/05/2017 8:40 AM Martinique, Peter M, MD [facility]  08/05/2017 9:30 AM Unk Pinto, MD [facility] None  02/11/2018 10:00 AM Unk Pinto, MD [facility] None     HPI 71 y.o. male  presents for 3 month follow up on hypertension, cholesterol, diabetes and vitamin D deficiency.   He complains of AB pain x 2-3 weeks, has history of kidney stones, has a lot of AB bloating, can be worse with food but better if he passes gas. No  diarrhea, constipation, blood in stool. Has had negative cologuard this year. He is under more stress, went back to work, he is helping to set up a building.     His blood pressure has been controlled at home, he is on atenolol 100mg  1/2 pill daily and he is benazepril 40mg  daily,, today his BP is BP: 122/84. He has history of NSTEMI s/p DES stent in 2016.   He does not workout. He denies chest pain, shortness of breath, dizziness.  He is not on cholesterol medication and denies myalgias. His cholesterol is at goal. The cholesterol was:   Lab Results  Component Value Date   CHOL 104 01/16/2017   HDL 58 01/16/2017   LDLCALC 30 01/16/2017   TRIG 82 01/16/2017   CHOLHDL 1.8 01/16/2017    He has been working on diet and exercise for diabetes with diabetic chronic kidney disease, he is on bASA, he is on ACE/ARB, and denies  paresthesia of the feet, polydipsia, polyuria and visual disturbances. Last A1C was:  Lab Results  Component Value Date   HGBA1C 6.0 (H) 01/16/2017  Last GFR: Lab Results  Component Value Date   GFRNONAA 50 (L) 01/16/2017   Patient is on Vitamin D supplement. Lab Results  Component Value Date   VD25OH 90 01/16/2017   BMI is Body mass index is 26.08 kg/m., he is working on diet and exercise. Wt Readings from Last 3 Encounters:  04/29/17 176 lb 9.6 oz (80.1 kg)  01/16/17 179 lb 12.8 oz (81.6 kg)  12/28/16 181 lb (82.1 kg)    Current Medications:  Current Outpatient Prescriptions on File Prior to Visit  Medication Sig Dispense Refill  . aspirin 81 MG tablet Take 81 mg by mouth daily.    Marland Kitchen atenolol (TENORMIN) 25 MG tablet TAKE 1/2 TO 1 TABLET BY MOUTH DAILY AS DIRECTED 90 tablet 1  . benazepril (LOTENSIN) 40 MG tablet TAKE 1 TABLET BY MOUTH EVERY DAY FOR BLOOD PRESSURE 90 tablet 3  . Cholecalciferol (VITAMIN D) 2000 UNITS CAPS Take 2,000 Units by mouth 2 (two) times daily.     Marland Kitchen glipiZIDE (GLUCOTROL) 5 MG tablet TAKE 1/2 TO 1 TABLET BY MOUTH TWICE DAILY WITH MEALS  180 tablet 1  . magnesium gluconate (MAGONATE) 500 MG tablet Take 500 mg by mouth daily.    . metFORMIN (GLUCOPHAGE) 500 MG tablet TAKE 1 TO 2 TABLETS BY MOUTH TWICE A DAY FOR DIABETES 360 tablet 1  . nitroGLYCERIN (NITROSTAT) 0.4 MG SL tablet Place 1 tablet (0.4 mg total) under the tongue every 5 (five) minutes x 3 doses as needed for chest pain. 25 tablet 2  . Omega-3 Fatty Acids (FISH OIL PO) Take 1 capsule by mouth 2 (two) times daily.     . ONE TOUCH ULTRA TEST test strip USE ONCE A DAY AS DIRECTED 100 each 99  . ranitidine (ZANTAC) 300 MG tablet TAKE 1 TO 2 TABLETS DAILY FOR HEARTBURN & REFLUX 180 tablet 2  . rosuvastatin (CRESTOR) 20 MG tablet Take 0.5 tablets (10 mg total) by mouth daily. 90 tablet 3   No current facility-administered medications on file prior to visit.    Medical History:  Past Medical History:  Diagnosis Date  . BPH (benign prostatic hyperplasia)   . CAD (coronary artery disease)    a. Cath 09/2015: 100% stenosis Prox LAD --> 2 DES placed, 90% D2, 99% 2nd Mrg --> DES placed, 70% RCA --> Medically manage  . Colitis   . Diabetes mellitus without complication ([place])   . ED (erectile dysfunction)   . GERD (gastroesophageal reflux disease)   . Hyperlipidemia   . Hypertension   . Kidney stone   . Vitamin D deficiency    Allergies:  Allergies  Allergen Reactions  . Ppd [Tuberculin Purified Protein Derivative] Other (See Comments)    Positive tb test.  . Tricor [Fenofibrate] Other (See Comments)    GI upset     Review of Systems:  Review of Systems  Constitutional: Negative for chills, fever and malaise/fatigue.  HENT: Positive for hearing loss (wears hearing aids). Negative for congestion, sore throat and tinnitus.   Eyes: Negative.   Respiratory: Negative for cough, shortness of breath and wheezing.   Cardiovascular: Negative for chest pain, palpitations and leg swelling.  Gastrointestinal: Positive for abdominal pain. Negative for blood in stool,  constipation, diarrhea, heartburn, melena, nausea and vomiting.  Genitourinary: Negative for dysuria, frequency and urgency.  Skin: Negative.   Neurological: Negative for dizziness, sensory change, loss of consciousness and headaches.  Psychiatric/Behavioral: Negative for depression. The patient is not nervous/anxious and does not have insomnia.     Family history- Review and unchanged Social history- Review and unchanged Physical Exam: BP 122/84   Pulse (!) 57   Temp 97.7 F (36.5 C)   Resp 14   Ht 5\' 9"  (1.753 m)   Wt 176 lb 9.6 oz (80.1 kg)   SpO2 97%   BMI 26.08 kg/m  Wt Readings from Last 3 Encounters:  04/29/17 176 lb 9.6 oz (  80.1 kg)  01/16/17 179 lb 12.8 oz (81.6 kg)  12/28/16 181 lb (82.1 kg)   General Appearance: Well nourished, in no apparent distress. Eyes: PERRLA, EOMs, conjunctiva no swelling or erythema Sinuses: No Frontal/maxillary tenderness ENT/Mouth: Ext aud canals clear, TMs without erythema, bulging. No erythema, swelling, or exudate on post pharynx.  Tonsils not swollen or erythematous. Hearing abnormal, wears hearing aids.   Neck: Supple, thyroid normal.  Respiratory: Respiratory effort normal, BS equal bilaterally without rales, rhonchi, wheezing or stridor.  Cardio: RRR with no MRGs. Brisk peripheral pulses without edema.  Abdomen: Soft, + BS.  Non tender, no guarding, rebound, hernias, masses. Lymphatics: Non tender without lymphadenopathy.  Musculoskeletal: Full ROM, 5/5 strength, Normal gait Skin: Warm, dry without rashes, lesions, ecchymosis.  Neuro: Cranial nerves intact. No cerebellar symptoms.  Psych: Awake and oriented X 3, normal affect, Insight and Judgment appropriate.    Vicie Mutters, PA-C 8:45 AM [facility]

## 2017-04-29 ENCOUNTER — Encounter: Payer: Self-pay | Admitting: Physician Assistant

## 2017-04-29 ENCOUNTER — Ambulatory Visit (INDEPENDENT_AMBULATORY_CARE_PROVIDER_SITE_OTHER): Payer: PPO | Admitting: Physician Assistant

## 2017-04-29 VITALS — BP 122/84 | HR 57 | Temp 97.7°F | Resp 14 | Ht 69.0 in | Wt 176.6 lb

## 2017-04-29 DIAGNOSIS — E782 Mixed hyperlipidemia: Secondary | ICD-10-CM | POA: Diagnosis not present

## 2017-04-29 DIAGNOSIS — R1084 Generalized abdominal pain: Secondary | ICD-10-CM | POA: Diagnosis not present

## 2017-04-29 DIAGNOSIS — E1121 Type 2 diabetes mellitus with diabetic nephropathy: Secondary | ICD-10-CM

## 2017-04-29 DIAGNOSIS — I700 Atherosclerosis of aorta: Secondary | ICD-10-CM | POA: Diagnosis not present

## 2017-04-29 DIAGNOSIS — Z79899 Other long term (current) drug therapy: Secondary | ICD-10-CM

## 2017-04-29 DIAGNOSIS — I10 Essential (primary) hypertension: Secondary | ICD-10-CM | POA: Diagnosis not present

## 2017-04-29 DIAGNOSIS — I25110 Atherosclerotic heart disease of native coronary artery with unstable angina pectoris: Secondary | ICD-10-CM

## 2017-04-29 LAB — BASIC METABOLIC PANEL WITH GFR
BUN: 20 mg/dL (ref 7–25)
CALCIUM: 9.6 mg/dL (ref 8.6–10.3)
CO2: 30 mmol/L (ref 20–31)
Chloride: 102 mmol/L (ref 98–110)
Creat: 1.08 mg/dL (ref 0.70–1.18)
GFR, EST NON AFRICAN AMERICAN: 68 mL/min (ref >=60)
GFR, Est African American: 79 mL/min (ref >=60)
GLUCOSE: 84 mg/dL (ref 65–99)
Potassium: 4.8 mmol/L (ref 3.5–5.3)
Sodium: 137 mmol/L (ref 135–146)

## 2017-04-29 LAB — MAGNESIUM: Magnesium: 2 mg/dL (ref 1.5–2.5)

## 2017-04-29 LAB — CBC WITH DIFFERENTIAL/PLATELET
BASOS PCT: 1 %
Basophils Absolute: 91 cells/uL (ref 0–200)
EOS ABS: 637 {cells}/uL — AB (ref 15–500)
Eosinophils Relative: 7 %
HEMATOCRIT: 42.3 % (ref 38.5–50.0)
HEMOGLOBIN: 14.3 g/dL (ref 13.2–17.1)
LYMPHS ABS: 1820 {cells}/uL (ref 850–3900)
LYMPHS PCT: 20 %
MCH: 29.9 pg (ref 27.0–33.0)
MCHC: 33.8 g/dL (ref 32.0–36.0)
MCV: 88.5 fL (ref 80.0–100.0)
MONO ABS: 728 {cells}/uL (ref 200–950)
MPV: 9.8 fL (ref 7.5–12.5)
Monocytes Relative: 8 %
NEUTROS ABS: 5824 {cells}/uL (ref 1500–7800)
Neutrophils Relative %: 64 %
Platelets: 262 10*3/uL (ref 140–400)
RBC: 4.78 MIL/uL (ref 4.20–5.80)
RDW: 13.6 % (ref 11.0–15.0)
WBC: 9.1 10*3/uL (ref 3.8–10.8)

## 2017-04-29 LAB — HEPATIC FUNCTION PANEL
ALK PHOS: 64 U/L (ref 40–115)
ALT: 18 U/L (ref 9–46)
AST: 17 U/L (ref 10–35)
Albumin: 4.2 g/dL (ref 3.6–5.1)
BILIRUBIN DIRECT: 0.2 mg/dL (ref <=0.2)
BILIRUBIN INDIRECT: 0.5 mg/dL (ref 0.2–1.2)
TOTAL PROTEIN: 6.4 g/dL (ref 6.1–8.1)
Total Bilirubin: 0.7 mg/dL (ref 0.2–1.2)

## 2017-04-29 LAB — LIPID PANEL
CHOL/HDL RATIO: 1.8 ratio (ref <5.0)
CHOLESTEROL: 99 mg/dL (ref <200)
HDL: 54 mg/dL (ref >40)
LDL Cholesterol: 26 mg/dL (ref <100)
Triglycerides: 97 mg/dL (ref <150)
VLDL: 19 mg/dL (ref <30)

## 2017-04-29 LAB — TSH: TSH: 1.92 mIU/L (ref 0.40–4.50)

## 2017-04-29 NOTE — Patient Instructions (Signed)
Stop milk products like milk, cheese etc Stop the metformin for 1-2 weeks.  Call if the sugar is higher than 150 in the morning.  Make sure you are on zantac/ranitidine.   Please go to the ER if you have any severe AB pain, unable to hold down food/water, blood in stool or vomit, chest pain, shortness of breath, or any worsening symptoms.   Please make sure you decrease bad carbs like white bread, white rice, potatoes, corn, soft drinks, pasta, cereals, refined sugars, sweet tea, dried fruits, and fruit juice. Good carbs are okay to eat in moderation like sweet potatoes, brown rice, whole grain pasta/bread, most fruit (except dried fruit) and you can eat as many veggies as you want.   Greater than 6.5 is considered diabetic. Between 6.4 and 5.7 is prediabetic If your A1C is less than 5.7 you are NOT diabetic.  Targets for Glucose Readings: Time of Check Target for patients WITHOUT Diabetes Target for DIABETICS  Before Meals Less than 100  less than 150  Two hours after meals Less than 200  Less than 250     Food Choices for Gastroesophageal Reflux Disease, Adult When you have gastroesophageal reflux disease (GERD), the foods you eat and your eating habits are very important. Choosing the right foods can help ease your discomfort. What guidelines do I need to follow?  Choose fruits, vegetables, whole grains, and low-fat dairy products.  Choose low-fat meat, fish, and poultry.  Limit fats such as oils, salad dressings, butter, nuts, and avocado.  Keep a food diary. This helps you identify foods that cause symptoms.  Avoid foods that cause symptoms. These may be different for everyone.  Eat small meals often instead of 3 large meals a day.  Eat your meals slowly, in a place where you are relaxed.  Limit fried foods.  Cook foods using methods other than frying.  Avoid drinking alcohol.  Avoid drinking large amounts of liquids with your meals.  Avoid bending over or lying  down until 2-3 hours after eating. What foods are not recommended? These are some foods and drinks that may make your symptoms worse: Vegetables Tomatoes. Tomato juice. Tomato and spaghetti sauce. Chili peppers. Onion and garlic. Horseradish. Fruits Oranges, grapefruit, and lemon (fruit and juice). Meats High-fat meats, fish, and poultry. This includes hot dogs, ribs, ham, sausage, salami, and bacon. Dairy Whole milk and chocolate milk. Sour cream. Cream. Butter. Ice cream. Cream cheese. Drinks Coffee and tea. Bubbly (carbonated) drinks or energy drinks. Condiments Hot sauce. Barbecue sauce. Sweets/Desserts Chocolate and cocoa. Donuts. Peppermint and spearmint. Fats and Oils High-fat foods. This includes [place] fries and potato chips. Other Vinegar. Strong spices. This includes black pepper, white pepper, red pepper, cayenne, curry powder, cloves, ginger, and chili powder. The items listed above may not be a complete list of foods and drinks to avoid. Contact your dietitian for more information. This information is not intended to replace advice given to you by your health care provider. Make sure you discuss any questions you have with your health care provider. Document Released: 03/25/2012 Document Revised: 03/01/2016 Document Reviewed: 07/29/2013 Elsevier Interactive Patient Education  2017 [employer].   10 Tips on Belching, Bloating, and Flatulence 1. Belching is caused by swallowed air from:  Eating or drinking too fast  Poorly fitting dentures; not chewing food completely  Carbonated beverages  Chewing gum or sucking on hard candies  Excessive swallowing due to nervous tension or postnasal drip  Forced belching to relieve abdominal  discomfort 2. To prevent excessive belching, avoid:  Carbonated beverages  Chewing gum  Hard candies  Simethicone/GasX may be helpful  3. Abdominal bloating and discomfort may be due to intestinal sensitivity or symptoms of irritable bowel  syndrome. To relieve symptoms, avoid:  Broccoli  Baked beans  Cabbage  Carbonated drinks  Cauliflower  Chewing gum  Hard candy 4. Abdominal distention resulting from weak abdominal muscles:  Is better in the morning  Gets worse as the day progresses  Is relieved by lying down 5. To prevent Abdominal distention:  Tighten abdominal muscles by pulling in your stomach several times during the day  Do sit-up exercises if possible  Wear an abdominal support garment if exercise is too difficult 6. Flatulence is gas created through bacterial action in the bowel and passed rectally. Keep in mind that:  10-18 passages per day are normal  Primary gases are harmless and odorless  Noticeable smells are trace gases related to food intake 7. Foods that are likely to form gas include:  Milk, dairy products, and medications that contain lactose--If your body doesn't produce the enzyme (lactase) to break it down.  Certain vegetables--baked beans, cauliflower, broccoli, cabbage  Certain starches--wheat, oats, corn, potatoes. Rice is a good substitute. 8. Identify offending foods. Reduce or eliminate these gas-forming foods from your diet.

## 2017-04-30 LAB — HEMOGLOBIN A1C
HEMOGLOBIN A1C: 6.3 % — AB (ref <5.7)
Mean Plasma Glucose: 134 mg/dL

## 2017-05-22 ENCOUNTER — Encounter: Payer: Self-pay | Admitting: Physician Assistant

## 2017-05-22 ENCOUNTER — Ambulatory Visit (INDEPENDENT_AMBULATORY_CARE_PROVIDER_SITE_OTHER): Payer: PPO | Admitting: Physician Assistant

## 2017-05-22 VITALS — BP 138/80 | HR 50 | Temp 97.5°F | Resp 14 | Ht 69.0 in | Wt 174.6 lb

## 2017-05-22 DIAGNOSIS — R109 Unspecified abdominal pain: Secondary | ICD-10-CM

## 2017-05-22 MED ORDER — TRAMADOL HCL 50 MG PO TABS: ORAL_TABLET | ORAL | 0 refills | Status: DC

## 2017-05-22 MED ORDER — PANTOPRAZOLE SODIUM 40 MG PO TBEC: 40.0000 mg | DELAYED_RELEASE_TABLET | Freq: Every day | ORAL | 0 refills | Status: DC

## 2017-05-22 NOTE — Progress Notes (Signed)
Subjective:    Patient ID: Daniel Burnett, Daniel Burnett    DOB: [birth year], 71 y.o.   MRN: [id number]  HPI 71 y.o. WM with history of kidney stones presents with AB pain x 6 weeks.  He was seen 04/29/17 for regular follow up and complained of AB pain, stopped metformin but did keep using milk products, but continues to have AB pain.  Continue to have AB pain, occ left flank pain and under his left rib, worse at night, no fever, chills. No abnormal urine. Some heart burn/burping/belching.  No diarrhea, no constipation, no blood in stool or black stool. Had negative cologuard this year.   Blood pressure 138/80, pulse (!) 50, temperature (!) 97.5 F (36.4 C), resp. rate 14, height 5\' 9"  (1.753 m), weight 174 lb 9.6 oz (79.2 kg), SpO2 95 %.  Medications Current Outpatient Prescriptions on File Prior to Visit  Medication Sig  . aspirin 81 MG tablet Take 81 mg by mouth daily.  Marland Kitchen atenolol (TENORMIN) 25 MG tablet TAKE 1/2 TO 1 TABLET BY MOUTH DAILY AS DIRECTED  . benazepril (LOTENSIN) 40 MG tablet TAKE 1 TABLET BY MOUTH EVERY DAY FOR BLOOD PRESSURE  . Cholecalciferol (VITAMIN D) 2000 UNITS CAPS Take 2,000 Units by mouth 2 (two) times daily.   Marland Kitchen glipiZIDE (GLUCOTROL) 5 MG tablet TAKE 1/2 TO 1 TABLET BY MOUTH TWICE DAILY WITH MEALS  . magnesium gluconate (MAGONATE) 500 MG tablet Take 500 mg by mouth daily.  . metFORMIN (GLUCOPHAGE) 500 MG tablet TAKE 1 TO 2 TABLETS BY MOUTH TWICE A DAY FOR DIABETES  . nitroGLYCERIN (NITROSTAT) 0.4 MG SL tablet Place 1 tablet (0.4 mg total) under the tongue every 5 (five) minutes x 3 doses as needed for chest pain.  . Omega-3 Fatty Acids (FISH OIL PO) Take 1 capsule by mouth 2 (two) times daily.   . ONE TOUCH ULTRA TEST test strip USE ONCE A DAY AS DIRECTED  . ranitidine (ZANTAC) 300 MG tablet TAKE 1 TO 2 TABLETS DAILY FOR HEARTBURN & REFLUX  . rosuvastatin (CRESTOR) 20 MG tablet Take 0.5 tablets (10 mg total) by mouth daily.   No current facility-administered  medications on file prior to visit.     Problem list He has Essential hypertension; Hyperlipidemia; Vitamin D deficiency; Medication management; T2_NIDDM w/ CKD; Elevated PSA; Medicare annual wellness visit, subsequent; Atherosclerosis of native coronary artery of native heart with unstable angina pectoris ([place]); ASHD (arteriosclerotic heart disease); Esophageal reflux; and Thoracic aorta atherosclerosis ([place]) on his problem list.  Review of Systems  Respiratory: Negative.   Cardiovascular: Negative.   Gastrointestinal: Positive for abdominal distention and abdominal pain. Negative for anal bleeding, blood in stool, constipation, diarrhea, nausea, rectal pain and vomiting.  Genitourinary: Positive for flank pain. Negative for decreased urine volume, difficulty urinating, discharge, dysuria, enuresis, frequency, genital sores, hematuria, penile pain, penile swelling, scrotal swelling, testicular pain and urgency.  Musculoskeletal: Negative for back pain.  Skin: Negative for rash.  All other systems reviewed and are negative.      Objective:   Physical Exam  Constitutional: He is oriented to person, place, and time. He appears well-developed and well-nourished.  HENT:  Head: Normocephalic and atraumatic.  Right Ear: External ear normal.  Left Ear: External ear normal.  Nose: Nose normal.  Eyes: Conjunctivae and EOM are normal.  Neck: Normal range of motion. Neck supple. No JVD present. No thyromegaly present.  Cardiovascular: Normal rate, regular rhythm, normal heart sounds and intact distal pulses.   Pulmonary/Chest:  Effort normal and breath sounds normal.  Abdominal: Soft. Bowel sounds are normal. He exhibits no distension and no mass. There is no tenderness. There is no rebound and no guarding.  Musculoskeletal: Normal range of motion. He exhibits no edema or tenderness.  Lymphadenopathy:    He has no cervical adenopathy.  Neurological: He is alert and oriented to person, place, and  time. No cranial nerve deficit. Coordination normal.  Skin: Skin is warm and dry.  Psychiatric: He has a normal mood and affect. His behavior is normal. Judgment and thought content normal.  Nursing note and vitals reviewed.      Assessment & Plan:    Left flank pain/Epigastric pain No red flags, normal cologuard History of stones, declines KUB/CT at this time since going on vacation Saturday If still having an issue will get when he gets back If symptoms worse or any change in symptoms will go to ER -     Urinalysis, Routine w reflex microscopic -     Urine Culture -     pantoprazole (PROTONIX) 40 MG tablet; Take 1 tablet (40 mg total) by mouth daily. -     traMADol (ULTRAM) 50 MG tablet; 1 pill twice daily as needed for pain

## 2017-05-22 NOTE — Patient Instructions (Addendum)
Get on protonix 40mg  x 2 weeks then stop Can take tramadol up to 2 x daily for pain STOP MILK, CHEESE  If worse pain, nausea/vomiting, diarrhea, chills, shortness of breath, chest pain, fever go to ER   Flank Pain, Adult Flank pain is pain that is located on the side of the body between the upper abdomen and the back. This area is called the flank. The pain may occur over a short period of time (acute), or it may be long-term or recurring (chronic). It may be mild or severe. Flank pain can be caused by many things, including:  Muscle soreness or injury.  Kidney stones or kidney disease.  Stress.  A disease of the spine (vertebral disk disease).  A lung infection (pneumonia).  Fluid around the lungs (pulmonary edema).  A skin rash caused by the chickenpox virus (shingles).  Tumors that affect the back of the abdomen.  Gallbladder disease.  Follow these instructions at home:  Drink enough fluid to keep your urine clear or pale yellow.  Rest as told by your health care provider.  Take over-the-counter and prescription medicines only as told by your health care provider.  Keep a journal to track what has caused your flank pain and what has made it feel better.  Keep all follow-up visits as told by your health care provider. This is important. Contact a health care provider if:  Your pain is not controlled with medicine.  You have new symptoms.  Your pain gets worse.  You have a fever.  Your symptoms last longer than 2-3 days.  You have trouble urinating or you are urinating very frequently. Get help right away if:  You have trouble breathing or you are short of breath.  Your abdomen hurts or it is swollen or red.  You have nausea or vomiting.  You feel faint or you pass out.  You have blood in your urine. Summary  Flank pain is pain that is located on the side of the body between the upper abdomen and the back.  The pain may occur over a short period of  time (acute), or it may be long-term or recurring (chronic). It may be mild or severe.  Flank pain can be caused by many things.  Contact your health care provider if your symptoms get worse or they last longer than 2-3 days. This information is not intended to replace advice given to you by your health care provider. Make sure you discuss any questions you have with your health care provider. Document Released: 11/15/2005 Document Revised: 12/07/2016 Document Reviewed: 12/07/2016 Elsevier Interactive Patient Education  2018 [employer].

## 2017-05-23 LAB — URINALYSIS, ROUTINE W REFLEX MICROSCOPIC
Bilirubin Urine: NEGATIVE
Glucose, UA: NEGATIVE
Hgb urine dipstick: NEGATIVE
Ketones, ur: NEGATIVE
Leukocytes, UA: NEGATIVE
Nitrite: NEGATIVE
Protein, ur: NEGATIVE
Specific Gravity, Urine: 1.024 (ref 1.001–1.03)
pH: 7 (ref 5.0–8.0)

## 2017-05-23 LAB — URINE CULTURE
MICRO NUMBER: 80883397
RESULT: NO GROWTH
SPECIMEN QUALITY:: ADEQUATE

## 2017-06-07 ENCOUNTER — Other Ambulatory Visit: Payer: Self-pay | Admitting: Internal Medicine

## 2017-06-12 ENCOUNTER — Other Ambulatory Visit: Payer: Self-pay | Admitting: Internal Medicine

## 2017-06-28 NOTE — Progress Notes (Signed)
Cardiology Office Note   Date:  07/05/2017   ID:  Daniel Burnett, DOB 1946/06/14, MRN [id number]  PCP:  Unk Pinto, MD  Cardiologist:  Wilmore Holsomback Martin[place], MD   Chief Complaint  Patient presents with  . Coronary Artery Disease    History of Present Illness: Daniel Burnett is a 71 y.o. male with a history of HTN, HL, NIDDM, CKD III, BPH, d/c 09/29/15 after NSTEMI w/ DES x 2 LAD & DES OM2. Med rx for 70% RCA, EF 55-60% by echo.  On follow up today he notes he has left flank pain for several weeks similar to when he had kidney stones in past. Primary care checked a UA and culture and they were negative. He has chronic stomach upset. Still working in [employer]. Energy level is good. He has no chest pain or dyspnea. No palpitations. No edema.    Past Medical History:  Diagnosis Date  . BPH (benign prostatic hyperplasia)   . CAD (coronary artery disease)    a. Cath 09/2015: 100% stenosis Prox LAD --> 2 DES placed, 90% D2, 99% 2nd Mrg --> DES placed, 70% RCA --> Medically manage  . Colitis   . Diabetes mellitus without complication ([place])   . ED (erectile dysfunction)   . GERD (gastroesophageal reflux disease)   . Hyperlipidemia   . Hypertension   . Kidney stone   . Vitamin D deficiency     Past Surgical History:  Procedure Laterality Date  . CARDIAC CATHETERIZATION N/A 09/27/2015   Procedure: Left Heart Cath and Coronary Angiography;  Surgeon: Leonie Man, MD;  Location: [facility];  Service: Cardiovascular;  Laterality: N/A;  . CARDIAC CATHETERIZATION N/A 09/27/2015   Procedure: Coronary Stent Intervention;  Surgeon: Leonie Man, MD;  Location: [facility];  Service: Cardiovascular;  Laterality: N/A;  Lad/OM2  . CYSTOSCOPY W/ RETROGRADES  1991   stone extractions  . [place]   negative  . SQUAMOUS CELL CARCINOMA EXCISION  2001   bridge of nose    Current Outpatient Prescriptions  Medication Sig Dispense Refill  .  aspirin 81 MG tablet Take 81 mg by mouth daily.    Marland Kitchen atenolol (TENORMIN) 25 MG tablet TAKE 1/2 TO 1 TABLET BY MOUTH DAILY AS DIRECTED 90 tablet 1  . benazepril (LOTENSIN) 40 MG tablet TAKE 1 TABLET BY MOUTH EVERY DAY FOR BLOOD PRESSURE 90 tablet 3  . Cholecalciferol (VITAMIN D) 2000 UNITS CAPS Take 2,000 Units by mouth 2 (two) times daily.     Marland Kitchen glipiZIDE (GLUCOTROL) 5 MG tablet TAKE 1/2 TO 1 TABLET BY MOUTH TWICE DAILY WITH MEALS 180 tablet 1  . magnesium gluconate (MAGONATE) 500 MG tablet Take 500 mg by mouth daily.    . metFORMIN (GLUCOPHAGE) 500 MG tablet TAKE 1 TO 2 TABLETS BY MOUTH TWICE A DAY FOR DIABETES 360 tablet 1  . nitroGLYCERIN (NITROSTAT) 0.4 MG SL tablet Place 1 tablet (0.4 mg total) under the tongue every 5 (five) minutes x 3 doses as needed for chest pain. 25 tablet 2  . Omega-3 Fatty Acids (FISH OIL PO) Take 1 capsule by mouth 2 (two) times daily.     . ONE TOUCH ULTRA TEST test strip USE ONCE A DAY AS DIRECTED 100 each 5  . pantoprazole (PROTONIX) 40 MG tablet Take 1 tablet (40 mg total) by mouth daily. 30 tablet 0  . ranitidine (ZANTAC) 300 MG tablet TAKE 1 TO 2 TABLETS DAILY FOR  HEARTBURN & REFLUX 180 tablet 2  . rosuvastatin (CRESTOR) 20 MG tablet Take 0.5 tablets (10 mg total) by mouth daily. 90 tablet 3   No current facility-administered medications for this visit.     Allergies:   Ppd [tuberculin purified protein derivative] and Tricor [fenofibrate]    Social History:  The patient  reports that he quit smoking about 54 years ago. He has never used smokeless tobacco. He reports that he does not drink alcohol or use drugs.   Family History:  The patient's family history includes Cancer in his cousin; Diabetes in his maternal aunt; Heart attack in his father and mother; Heart disease in his brother, father, and mother; Hypertension in his brother; Leukemia in his cousin; Stroke in his brother.    ROS:  Please see the history of present illness. All other systems are  reviewed and negative.    PHYSICAL EXAM: VS:  BP 140/63 (BP Location: Right Arm)   Pulse (!) 46   Ht 5\' 9"  (1.753 m)   Wt 178 lb (80.7 kg)   BMI 26.29 kg/m  , BMI Body mass index is 26.29 kg/m. GENERAL:  Well appearing WM in NAD HEENT:  PERRL, EOMI, sclera are clear. Oropharynx is clear. NECK:  No jugular venous distention, carotid upstroke brisk and symmetric, no bruits, no thyromegaly or adenopathy LUNGS:  Clear to auscultation bilaterally CHEST:  Unremarkable HEART:  RRR,  PMI not displaced or sustained,S1 and S2 within normal limits, no S3, no S4: no clicks, no rubs, 1-8/2 systolic murmur LSB.  ABD:  Soft, nontender. BS +, no masses or bruits. No hepatomegaly, no splenomegaly EXT:  2 + pulses throughout, no edema, no cyanosis no clubbing SKIN:  Warm and dry.  No rashes NEURO:  Alert and oriented x 3. Cranial nerves II through XII intact. PSYCH:  Cognitively intact     EKG:  EKG is  Not ordered today.    Recent Labs: 04/29/2017: ALT 18; BUN 20; Creat 1.08; Hemoglobin 14.3; Magnesium 2.0; Platelets 262; Potassium 4.8; Sodium 137; TSH 1.92    Lipid Panel    Component Value Date/Time   CHOL 99 04/29/2017 0913   TRIG 97 04/29/2017 0913   HDL 54 04/29/2017 0913   CHOLHDL 1.8 04/29/2017 0913   VLDL 19 04/29/2017 0913   LDLCALC 26 04/29/2017 0913     Wt Readings from Last 3 Encounters:  07/05/17 178 lb (80.7 kg)  05/22/17 174 lb 9.6 oz (79.2 kg)  04/29/17 176 lb 9.6 oz (80.1 kg)     Other studies Reviewed: Additional studies/ records that were reviewed today include: none  ASSESSMENT AND PLAN:  1.  CAD s/p NSTEMI in December 2016. Extensive stenting of the LAD and first OM. Residual 70% disease in the distal RCA. He is asymptomatic. He is on appropriate therapy with aspirin, beta blocker, ACE inhibitor and statin. Continue lifestyle modification.   2. Hyperlipidemia. Excellent control on Crestor.   3. HTN well controlled.  4. DM type 2 with CKD. Followed by  primary care. Currently on glpizide and metformin. Given recent data would consider switching glipizide to Jardiance for CV risk reduction but will defer to primary care.   5. Left flank pain. Likely kidney stone. Will check back with primary care. May need CT.    Current medicines are reviewed at length with the patient today.  The patient has no concerns regarding medicines.  The following changes have been made: None  Labs/ tests ordered today include:  No orders of the defined types were placed in this encounter.   Disposition:   FU with Dr. Martinique in 6 months.  Signed, Mosella Kasa Martin[place], MD  07/05/2017 9:35 AM    [facility]

## 2017-07-05 ENCOUNTER — Encounter: Payer: Self-pay | Admitting: Cardiology

## 2017-07-05 ENCOUNTER — Ambulatory Visit (INDEPENDENT_AMBULATORY_CARE_PROVIDER_SITE_OTHER): Payer: PPO | Admitting: Cardiology

## 2017-07-05 VITALS — BP 140/63 | HR 46 | Ht 69.0 in | Wt 178.0 lb

## 2017-07-05 DIAGNOSIS — E782 Mixed hyperlipidemia: Secondary | ICD-10-CM | POA: Diagnosis not present

## 2017-07-05 DIAGNOSIS — E1121 Type 2 diabetes mellitus with diabetic nephropathy: Secondary | ICD-10-CM | POA: Diagnosis not present

## 2017-07-05 DIAGNOSIS — I2510 Atherosclerotic heart disease of native coronary artery without angina pectoris: Secondary | ICD-10-CM | POA: Diagnosis not present

## 2017-07-05 DIAGNOSIS — I10 Essential (primary) hypertension: Secondary | ICD-10-CM

## 2017-07-05 NOTE — Patient Instructions (Addendum)
Continue your current therapy  I will see you in 6 months.   

## 2017-07-13 ENCOUNTER — Other Ambulatory Visit: Payer: Self-pay | Admitting: Physician Assistant

## 2017-07-13 DIAGNOSIS — R109 Unspecified abdominal pain: Secondary | ICD-10-CM

## 2017-08-05 ENCOUNTER — Ambulatory Visit (INDEPENDENT_AMBULATORY_CARE_PROVIDER_SITE_OTHER): Payer: PPO | Admitting: Internal Medicine

## 2017-08-05 ENCOUNTER — Encounter: Payer: Self-pay | Admitting: Internal Medicine

## 2017-08-05 VITALS — BP 138/76 | HR 52 | Temp 97.7°F | Resp 18 | Ht 69.0 in | Wt 175.2 lb

## 2017-08-05 DIAGNOSIS — K219 Gastro-esophageal reflux disease without esophagitis: Secondary | ICD-10-CM | POA: Diagnosis not present

## 2017-08-05 DIAGNOSIS — E782 Mixed hyperlipidemia: Secondary | ICD-10-CM

## 2017-08-05 DIAGNOSIS — Z23 Encounter for immunization: Secondary | ICD-10-CM

## 2017-08-05 DIAGNOSIS — E1122 Type 2 diabetes mellitus with diabetic chronic kidney disease: Secondary | ICD-10-CM

## 2017-08-05 DIAGNOSIS — E559 Vitamin D deficiency, unspecified: Secondary | ICD-10-CM

## 2017-08-05 DIAGNOSIS — N182 Chronic kidney disease, stage 2 (mild): Secondary | ICD-10-CM | POA: Diagnosis not present

## 2017-08-05 DIAGNOSIS — Z79899 Other long term (current) drug therapy: Secondary | ICD-10-CM

## 2017-08-05 DIAGNOSIS — R109 Unspecified abdominal pain: Secondary | ICD-10-CM | POA: Diagnosis not present

## 2017-08-05 DIAGNOSIS — N183 Chronic kidney disease, stage 3 (moderate): Secondary | ICD-10-CM | POA: Diagnosis not present

## 2017-08-05 DIAGNOSIS — I10 Essential (primary) hypertension: Secondary | ICD-10-CM

## 2017-08-05 NOTE — Patient Instructions (Signed)
Recommend Adult Low Dose Aspirin or  coated  Aspirin 81 mg daily  To reduce risk of Colon Cancer 20 %,  Skin Cancer 26 % ,  Melanoma 46%  and  Pancreatic cancer 60% +++++++++++++++++++++++++ Vitamin D goal  is between 70-100.  Please make sure that you are taking your Vitamin D as directed.  It is very important as a natural anti-inflammatory  helping hair, skin, and nails, as well as reducing stroke and heart attack risk.  It helps your bones and helps with mood. It also decreases numerous cancer risks so please take it as directed.  Low Vit D is associated with a 200-300% higher risk for CANCER  and 200-300% higher risk for HEART   ATTACK  &  STROKE.   ...................................... It is also associated with higher death rate at younger ages,  autoimmune diseases like Rheumatoid arthritis, Lupus, Multiple Sclerosis.    Also many other serious conditions, like depression, Alzheimer's Dementia, infertility, muscle aches, fatigue, fibromyalgia - just to name a few. ++++++++++++++++++++ Recommend the book "The END of DIETING" by Dr Joel Fuhrman  & the book "The END of DIABETES " by Dr Joel Fuhrman At [employer] - get book & Audio CD's    Being diabetic has a  300% increased risk for heart attack, stroke, cancer, and alzheimer- type vascular dementia. It is very important that you work harder with diet by avoiding all foods that are white. Avoid white rice (brown & wild rice is OK), white potatoes (sweetpotatoes in moderation is OK), White bread or wheat bread or anything made out of white flour like bagels, donuts, rolls, buns, biscuits, cakes, pastries, cookies, pizza crust, and pasta (made from white flour & egg whites) - vegetarian pasta or spinach or wheat pasta is OK. Multigrain breads like Deano's or [employer]idge [employer], or multigrain sandwich thins or flatbreads.  Diet, exercise and weight loss can reverse and cure diabetes in the early stages.  Diet, exercise and weight loss is  very important in the control and prevention of complications of diabetes which affects every system in your body, ie. Brain - dementia/stroke, eyes - glaucoma/blindness, heart - heart attack/heart failure, kidneys - dialysis, stomach - gastric paralysis, intestines - malabsorption, nerves - severe painful neuritis, circulation - gangrene & loss of a leg(s), and finally cancer and Alzheimers.    I recommend avoid fried & greasy foods,  sweets/candy, white rice (brown or wild rice or Quinoa is OK), white potatoes (sweet potatoes are OK) - anything made from white flour - bagels, doughnuts, rolls, buns, biscuits,white and wheat breads, pizza crust and traditional pasta made of white flour & egg white(vegetarian pasta or spinach or wheat pasta is OK).  Multi-grain bread is OK - like multi-grain flat bread or sandwich thins. Avoid alcohol in excess. Exercise is also important.    Eat all the vegetables you want - avoid meat, especially red meat and dairy - especially cheese.  Cheese is the most concentrated form of trans-fats which is the worst thing to clog up our arteries. Veggie cheese is OK which can be found in the fresh produce section at [employer] or [employer] or [employer]  +++++++++++++++++++++ DASH Eating Plan  DASH stands for "Dietary Approaches to Stop Hypertension."   The DASH eating plan is a healthy eating plan that has been shown to reduce high blood pressure (hypertension). Additional health benefits may include reducing the risk of type 2 diabetes mellitus, heart disease, and stroke. The DASH eating plan may also   help with weight loss. WHAT DO I NEED TO KNOW ABOUT THE DASH EATING PLAN? For the DASH eating plan, you will follow these general guidelines:  Choose foods with a percent daily value for sodium of less than 5% (as listed on the food label).  Use salt-free seasonings or herbs instead of table salt or sea salt.  Check with your health care provider or pharmacist before  using salt substitutes.  Eat lower-sodium products, often labeled as "lower sodium" or "no salt added."  Eat fresh foods.  Eat more vegetables, fruits, and low-fat dairy products.  Choose whole grains. Look for the word "whole" as the first word in the ingredient list.  Choose fish   Limit sweets, desserts, sugars, and sugary drinks.  Choose heart-healthy fats.  Eat veggie cheese   Eat more home-cooked food and less restaurant, buffet, and fast food.  Limit fried foods.  Cook foods using methods other than frying.  Limit canned vegetables. If you do use them, rinse them well to decrease the sodium.  When eating at a restaurant, ask that your food be prepared with less salt, or no salt if possible.                      WHAT FOODS CAN I EAT? Read Dr Joel Fuhrman's books on The End of Dieting & The End of Diabetes  Grains Whole grain or whole wheat bread. Brown rice. Whole grain or whole wheat pasta. Quinoa, bulgur, and whole grain cereals. Low-sodium cereals. Corn or whole wheat flour tortillas. Whole grain cornbread. Whole grain crackers. Low-sodium crackers.  Vegetables Fresh or frozen vegetables (raw, steamed, roasted, or grilled). Low-sodium or reduced-sodium tomato and vegetable juices. Low-sodium or reduced-sodium tomato sauce and paste. Low-sodium or reduced-sodium canned vegetables.   Fruits All fresh, canned (in natural juice), or frozen fruits.  Protein Products  All fish and seafood.  Dried beans, peas, or lentils. Unsalted nuts and seeds. Unsalted canned beans.  Dairy Low-fat dairy products, such as skim or 1% milk, 2% or reduced-fat cheeses, low-fat ricotta or cottage cheese, or plain low-fat yogurt. Low-sodium or reduced-sodium cheeses.  Fats and Oils Tub margarines without trans fats. Light or reduced-fat mayonnaise and salad dressings (reduced sodium). Avocado. Safflower, olive, or canola oils. Natural peanut or almond butter.  Other Unsalted popcorn  and pretzels. The items listed above may not be a complete list of recommended foods or beverages. Contact your dietitian for more options.  +++++++++++++++  WHAT FOODS ARE NOT RECOMMENDED? Grains/ White flour or wheat flour White bread. White pasta. White rice. Refined cornbread. Bagels and croissants. Crackers that contain trans fat.  Vegetables  Creamed or fried vegetables. Vegetables in a . Regular canned vegetables. Regular canned tomato sauce and paste. Regular tomato and vegetable juices.  Fruits Dried fruits. Canned fruit in light or heavy syrup. Fruit juice.  Meat and Other Protein Products Meat in general - RED meat & White meat.  Fatty cuts of meat. Ribs, chicken wings, all processed meats as bacon, sausage, bologna, salami, fatback, hot dogs, bratwurst and packaged luncheon meats.  Dairy Whole or 2% milk, cream, half-and-half, and cream cheese. Whole-fat or sweetened yogurt. Full-fat cheeses or blue cheese. Non-dairy creamers and whipped toppings. Processed cheese, cheese spreads, or cheese curds.  Condiments Onion and garlic salt, seasoned salt, table salt, and sea salt. Canned and packaged gravies. Worcestershire sauce. Tartar sauce. Barbecue sauce. Teriyaki sauce. Soy sauce, including reduced sodium. Steak sauce. Fish sauce. Oyster sauce. Cocktail   sauce. Horseradish. Ketchup and mustard. Meat flavorings and tenderizers. Bouillon cubes. Hot sauce. Tabasco sauce. Marinades. Taco seasonings. Relishes.  Fats and Oils Butter, stick margarine, lard, shortening and bacon fat. Coconut, palm kernel, or palm oils. Regular salad dressings.  Pickles and olives. Salted popcorn and pretzels.  The items listed above may not be a complete list of foods and beverages to avoid.   

## 2017-08-05 NOTE — Progress Notes (Signed)
This very nice [name] presents for 6 month follow up with Hypertension, Hyperlipidemia, Pre-Diabetes and Vitamin D Deficiency.      Patient is treated for HTN (1995) & BP has been controlled at home. Today's BP is at goal - 138/76. Patient had PCA/DES x 2 for NSTEMI in Dec 2016 & is followed by Dr Daniel Burnett. Patient has had no complaints of any cardiac type chest pain, palpitations, dyspnea / orthopnea / PND, dizziness, claudication, or dependent edema.      Patient has hx/o kidney stones and relates recent intermittent vague L posterior flank to LLQ discomfort. He defers scheduling a CT Abd today, but if has more discomfort would prefer to see Dr Daniel Burnett.      Hyperlipidemia is controlled with diet & meds. Patient denies myalgias or other med SE's. Last Lipids were at goal: Lab Results  Component Value Date   CHOL 99 04/29/2017   HDL 54 04/29/2017   LDLCALC 26 04/29/2017   TRIG 97 04/29/2017   CHOLHDL 1.8 04/29/2017      Also, the patient has history of T2_NIDDM (2002) w/CKD3 and has had no symptoms of reactive hypoglycemia, diabetic polys, paresthesias or visual blurring.  Last A1c was not at goal: Lab Results  Component Value Date   HGBA1C 6.3 (H) 04/29/2017      Further, the patient also has history of Vitamin D Deficiency("19" / 2008) and supplements vitamin D without any suspected side-effects. Last vitamin D was at goal: Lab Results  Component Value Date   VD25OH 90 01/16/2017   Current Outpatient Prescriptions on File Prior to Visit  Medication Sig  . aspirin 81 MG tablet Take 81 mg by mouth daily.  Marland Kitchen atenolol (TENORMIN) 25 MG tablet TAKE 1/2 TO 1 TABLET BY MOUTH DAILY AS DIRECTED  . benazepril (LOTENSIN) 40 MG tablet TAKE 1 TABLET BY MOUTH EVERY DAY FOR BLOOD PRESSURE  . Cholecalciferol (VITAMIN D) 2000 UNITS CAPS Take 2,000 Units by mouth 2 (two) times daily.   Marland Kitchen glipiZIDE (GLUCOTROL) 5 MG tablet TAKE 1/2 TO 1 TABLET BY MOUTH TWICE DAILY WITH MEALS  . magnesium  gluconate (MAGONATE) 500 MG tablet Take 500 mg by mouth daily.  . metFORMIN (GLUCOPHAGE) 500 MG tablet TAKE 1 TO 2 TABLETS BY MOUTH TWICE A DAY FOR DIABETES  . nitroGLYCERIN (NITROSTAT) 0.4 MG SL tablet Place 1 tablet (0.4 mg total) under the tongue every 5 (five) minutes x 3 doses as needed for chest pain.  . Omega-3 Fatty Acids (FISH OIL PO) Take 1 capsule by mouth 2 (two) times daily.   . ONE TOUCH ULTRA TEST test strip USE ONCE A DAY AS DIRECTED  . rosuvastatin (CRESTOR) 20 MG tablet Take 0.5 tablets (10 mg total) by mouth daily.   No current facility-administered medications on file prior to visit.    Allergies  Allergen Reactions  . Ppd [Tuberculin Purified Protein Derivative] Other (See Comments)    Positive tb test.  . Tricor [Fenofibrate] Other (See Comments)    GI upset   PMHx:   Past Medical History:  Diagnosis Date  . BPH (benign prostatic hyperplasia)   . CAD (coronary artery disease)    a. Cath 09/2015: 100% stenosis Prox LAD --> 2 DES placed, 90% D2, 99% 2nd Mrg --> DES placed, 70% RCA --> Medically manage  . Colitis   . Diabetes mellitus without complication ([place])   . ED (erectile dysfunction)   . GERD (gastroesophageal reflux disease)   .  Hyperlipidemia   . Hypertension   . Kidney stone   . Vitamin D deficiency    Immunization History  Administered Date(s) Administered  . DT 02/09/2014  . DTaP 01/06/2005  . Influenza Whole 09/18/2012  . Influenza, High Dose Seasonal PF 06/29/2016  . Pneumococcal Conjugate-13 06/29/2016  . Pneumococcal Polysaccharide-23 09/18/2012   Past Surgical History:  Procedure Laterality Date  . CARDIAC CATHETERIZATION N/A 09/27/2015   Procedure: Left Heart Cath and Coronary Angiography;  Surgeon: Leonie Man, MD;  Location: [facility];  Service: Cardiovascular;  Laterality: N/A;  . CARDIAC CATHETERIZATION N/A 09/27/2015   Procedure: Coronary Stent Intervention;  Surgeon: Leonie Man, MD;  Location: [facility];  Service: Cardiovascular;  Laterality: N/A;  Lad/OM2  . CYSTOSCOPY W/ RETROGRADES  1991   stone extractions  . [place]   negative  . SQUAMOUS CELL CARCINOMA EXCISION  2001   bridge of nose   FHx:    Reviewed / unchanged  SHx:    Reviewed / unchanged  Systems Review:  Constitutional: Denies fever, chills, wt changes, headaches, insomnia, fatigue, night sweats, change in appetite. Eyes: Denies redness, blurred vision, diplopia, discharge, itchy, watery eyes.  ENT: Denies discharge, congestion, post nasal drip, epistaxis, sore throat, earache, hearing loss, dental pain, tinnitus, vertigo, sinus pain, snoring.  CV: Denies chest pain, palpitations, irregular heartbeat, syncope, dyspnea, diaphoresis, orthopnea, PND, claudication or edema. Respiratory: denies cough, dyspnea, DOE, pleurisy, hoarseness, laryngitis, wheezing.  Gastrointestinal: Denies dysphagia, odynophagia, heartburn, reflux, water brash, abdominal pain or cramps, nausea, vomiting, bloating, diarrhea, constipation, hematemesis, melena, hematochezia  or hemorrhoids. Genitourinary: Denies dysuria, frequency, urgency, nocturia, hesitancy, discharge, hematuria or flank pain. Musculoskeletal: Denies arthralgias, myalgias, stiffness, jt. swelling, pain, limping or strain/sprain.  Skin: Denies pruritus, rash, hives, warts, acne, eczema or change in skin lesion(s). Neuro: No weakness, tremor, incoordination, spasms, paresthesia or pain. Psychiatric: Denies confusion, memory loss or sensory loss. Endo: Denies change in weight, skin or hair change.  Heme/Lymph: No excessive bleeding, bruising or enlarged lymph nodes.  Physical Exam  BP 138/76   Pulse (!) 52   Temp 97.7 F (36.5 C)   Resp 18   Ht 5\' 9"  (1.753 m)   Wt 175 lb 3.2 oz (79.5 kg)   BMI 25.87 kg/m   Appears well nourished, well groomed  and in no distress.  Eyes: PERRLA, EOMs, conjunctiva no swelling or erythema. Sinuses: No frontal/maxillary  tenderness ENT/Mouth: EAC's clear, TM's nl w/o erythema, bulging. Nares clear w/o erythema, swelling, exudates. Oropharynx clear without erythema or exudates. Oral hygiene is good. Tongue normal, non obstructing. Hearing intact.  Neck: Supple. Thyroid nl. Car 2+/2+ without bruits, nodes or JVD. Chest: Respirations nl with BS clear & equal w/o rales, rhonchi, wheezing or stridor.  Cor: Heart sounds normal w/ regular rate and rhythm without sig. murmurs, gallops, clicks or rubs. Peripheral pulses normal and equal  without edema.  Abdomen: Soft & bowel sounds normal. Non-tender w/o guarding, rebound, hernias, masses or organomegaly.  Lymphatics: Unremarkable.  Musculoskeletal: Full ROM all peripheral extremities, joint stability, 5/5 strength and normal gait.  Skin: Warm, dry without exposed rashes, lesions or ecchymosis apparent.  Neuro: Cranial nerves intact, reflexes equal bilaterally. Sensory-motor testing grossly intact. Tendon reflexes grossly intact.  Pysch: Alert & oriented x 3.  Insight and judgement nl & appropriate. No ideations.  Assessment and Plan:  1. Essential hypertension  - Continue medication, monitor blood pressure at home.  - Continue DASH diet. Reminder  to go to the ER if any CP,  SOB, nausea, dizziness, severe HA, changes vision/speech.  - CBC with Differential/Platelet - BASIC METABOLIC PANEL WITH GFR - Magnesium - TSH  2. Hyperlipidemia, mixed  - Continue diet/meds, exercise,& lifestyle modifications.  - Continue monitor periodic cholesterol/liver & renal functions   - Hepatic function panel - Lipid panel - TSH  3. Type 2 diabetes mellitus with stage 2 chronic kidney disease, without long-term current use of insulin (HCC)  - Continue diet, exercise, lifestyle modifications.  - Monitor appropriate labs.  - Hemoglobin A1c - Insulin, random  4. Vitamin D deficiency  - Continue supplementation. - VITAMIN D 25 Hydroxy  5. Gastroesophageal reflux  disease  - pantoprazole (PROTONIX) 40 MG tablet; Take 40 mg by mouth daily.; Refill: 0  6. Left flank pain  - Urinalysis, Routine w reflex microscopic  7. Medication management  - CBC with Differential/Platelet - BASIC METABOLIC PANEL WITH GFR - Hepatic function panel - Magnesium - Lipid panel - TSH - Hemoglobin A1c - Insulin, random - VITAMIN D 25 Hydroxy  8. Need for immunization against influenza  - Flu vaccine HIGH DOSE PF (Fluzone High dose)          Discussed  regular exercise, BP monitoring, weight control to achieve/maintain BMI less than 25 and discussed med and SE's. Recommended labs to assess and monitor clinical status with further disposition pending results of labs. Over 30 minutes of exam, counseling, chart review was performed.

## 2017-08-06 LAB — BASIC METABOLIC PANEL WITH GFR
BUN: 21 mg/dL (ref 7–25)
CHLORIDE: 97 mmol/L — AB (ref 98–110)
CO2: 31 mmol/L (ref 20–32)
Calcium: 10 mg/dL (ref 8.6–10.3)
Creat: 1 mg/dL (ref 0.70–1.18)
GFR, Est African American: 87 mL/min/{1.73_m2} (ref >=60)
GFR, Est Non African American: 75 mL/min/{1.73_m2} (ref >=60)
GLUCOSE: 109 mg/dL — AB (ref 65–99)
POTASSIUM: 4.8 mmol/L (ref 3.5–5.3)
Sodium: 138 mmol/L (ref 135–146)

## 2017-08-06 LAB — LIPID PANEL
CHOLESTEROL: 126 mg/dL (ref <200)
HDL: 65 mg/dL (ref >40)
LDL Cholesterol (Calc): 42 mg/dL (calc)
Non-HDL Cholesterol (Calc): 61 mg/dL (calc) (ref <130)
Total CHOL/HDL Ratio: 1.9 (calc) (ref <5.0)
Triglycerides: 109 mg/dL (ref <150)

## 2017-08-06 LAB — HEPATIC FUNCTION PANEL
AG Ratio: 1.7 (calc) (ref 1.0–2.5)
ALKALINE PHOSPHATASE (APISO): 98 U/L (ref 40–115)
ALT: 15 U/L (ref 9–46)
AST: 13 U/L (ref 10–35)
Albumin: 4.4 g/dL (ref 3.6–5.1)
BILIRUBIN INDIRECT: 0.5 mg/dL (ref 0.2–1.2)
Bilirubin, Direct: 0.2 mg/dL (ref 0.0–0.2)
Globulin: 2.6 g/dL (calc) (ref 1.9–3.7)
TOTAL PROTEIN: 7 g/dL (ref 6.1–8.1)
Total Bilirubin: 0.7 mg/dL (ref 0.2–1.2)

## 2017-08-06 LAB — URINALYSIS, ROUTINE W REFLEX MICROSCOPIC
Bacteria, UA: NONE SEEN /HPF
Bilirubin Urine: NEGATIVE
Glucose, UA: NEGATIVE
HYALINE CAST: NONE SEEN /LPF
Ketones, ur: NEGATIVE
Leukocytes, UA: NEGATIVE
Nitrite: NEGATIVE
PH: 8 (ref 5.0–8.0)
Protein, ur: NEGATIVE
RBC / HPF: NONE SEEN /HPF (ref 0–2)
SPECIFIC GRAVITY, URINE: 1.012 (ref 1.001–1.03)
Squamous Epithelial / LPF: NONE SEEN /HPF (ref <=5)
WBC, UA: NONE SEEN /HPF (ref 0–5)

## 2017-08-06 LAB — CBC WITH DIFFERENTIAL/PLATELET
BASOS PCT: 1.1 %
Basophils Absolute: 130 cells/uL (ref 0–200)
EOS ABS: 791 {cells}/uL — AB (ref 15–500)
Eosinophils Relative: 6.7 %
HCT: 43.2 % (ref 38.5–50.0)
Hemoglobin: 14.9 g/dL (ref 13.2–17.1)
Lymphs Abs: 2301 cells/uL (ref 850–3900)
MCH: 29.2 pg (ref 27.0–33.0)
MCHC: 34.5 g/dL (ref 32.0–36.0)
MCV: 84.7 fL (ref 80.0–100.0)
MONOS PCT: 8.3 %
MPV: 10 fL (ref 7.5–12.5)
Neutro Abs: 7599 cells/uL (ref 1500–7800)
Neutrophils Relative %: 64.4 %
PLATELETS: 293 10*3/uL (ref 140–400)
RBC: 5.1 10*6/uL (ref 4.20–5.80)
RDW: 12.2 % (ref 11.0–15.0)
TOTAL LYMPHOCYTE: 19.5 %
WBC mixed population: 979 cells/uL — ABNORMAL HIGH (ref 200–950)
WBC: 11.8 10*3/uL — ABNORMAL HIGH (ref 3.8–10.8)

## 2017-08-06 LAB — TSH: TSH: 1.71 m[IU]/L (ref 0.40–4.50)

## 2017-08-06 LAB — INSULIN, RANDOM: INSULIN: 5.4 u[IU]/mL (ref 2.0–19.6)

## 2017-08-06 LAB — HEMOGLOBIN A1C
HEMOGLOBIN A1C: 7.1 %{Hb} — AB (ref <5.7)
Mean Plasma Glucose: 157 (calc)
eAG (mmol/L): 8.7 (calc)

## 2017-08-06 LAB — MAGNESIUM: MAGNESIUM: 2 mg/dL (ref 1.5–2.5)

## 2017-08-06 LAB — VITAMIN D 25 HYDROXY (VIT D DEFICIENCY, FRACTURES): VIT D 25 HYDROXY: 92 ng/mL (ref 30–100)

## 2017-08-08 ENCOUNTER — Other Ambulatory Visit: Payer: Self-pay | Admitting: Cardiology

## 2017-11-05 ENCOUNTER — Encounter: Payer: Self-pay | Admitting: Internal Medicine

## 2017-11-05 ENCOUNTER — Other Ambulatory Visit: Payer: Self-pay | Admitting: Internal Medicine

## 2017-11-05 ENCOUNTER — Ambulatory Visit (INDEPENDENT_AMBULATORY_CARE_PROVIDER_SITE_OTHER): Payer: PPO | Admitting: Internal Medicine

## 2017-11-05 VITALS — BP 116/58 | HR 64 | Temp 97.5°F | Resp 16 | Ht 69.0 in | Wt 167.4 lb

## 2017-11-05 DIAGNOSIS — R1084 Generalized abdominal pain: Secondary | ICD-10-CM

## 2017-11-05 DIAGNOSIS — R634 Abnormal weight loss: Secondary | ICD-10-CM | POA: Diagnosis not present

## 2017-11-05 DIAGNOSIS — E119 Type 2 diabetes mellitus without complications: Secondary | ICD-10-CM

## 2017-11-05 DIAGNOSIS — Z79899 Other long term (current) drug therapy: Secondary | ICD-10-CM

## 2017-11-05 DIAGNOSIS — R12 Heartburn: Secondary | ICD-10-CM

## 2017-11-05 DIAGNOSIS — R140 Abdominal distension (gaseous): Secondary | ICD-10-CM

## 2017-11-05 LAB — BASIC METABOLIC PANEL WITH GFR
BUN: 19 mg/dL (ref 7–25)
CO2: 28 mmol/L (ref 20–32)
CREATININE: 0.78 mg/dL (ref 0.70–1.18)
Calcium: 9.6 mg/dL (ref 8.6–10.3)
Chloride: 99 mmol/L (ref 98–110)
GFR, Est African American: 104 mL/min/{1.73_m2} (ref >=60)
GFR, Est Non African American: 90 mL/min/{1.73_m2} (ref >=60)
Glucose, Bld: 105 mg/dL — ABNORMAL HIGH (ref 65–99)
Potassium: 4.5 mmol/L (ref 3.5–5.3)
SODIUM: 136 mmol/L (ref 135–146)

## 2017-11-05 LAB — CBC WITH DIFFERENTIAL/PLATELET
BASOS ABS: 135 {cells}/uL (ref 0–200)
BASOS PCT: 0.9 %
EOS PCT: 7.4 %
Eosinophils Absolute: 1110 cells/uL — ABNORMAL HIGH (ref 15–500)
HCT: 34.6 % — ABNORMAL LOW (ref 38.5–50.0)
HEMOGLOBIN: 11.3 g/dL — AB (ref 13.2–17.1)
Lymphs Abs: 1695 cells/uL (ref 850–3900)
MCH: 25.7 pg — ABNORMAL LOW (ref 27.0–33.0)
MCHC: 32.7 g/dL (ref 32.0–36.0)
MCV: 78.6 fL — ABNORMAL LOW (ref 80.0–100.0)
MONOS PCT: 10.7 %
MPV: 10.3 fL (ref 7.5–12.5)
NEUTROS ABS: 10455 {cells}/uL — AB (ref 1500–7800)
Neutrophils Relative %: 69.7 %
PLATELETS: 343 10*3/uL (ref 140–400)
RBC: 4.4 10*6/uL (ref 4.20–5.80)
RDW: 13 % (ref 11.0–15.0)
Total Lymphocyte: 11.3 %
WBC mixed population: 1605 cells/uL — ABNORMAL HIGH (ref 200–950)
WBC: 15 10*3/uL — ABNORMAL HIGH (ref 3.8–10.8)

## 2017-11-05 LAB — HEPATIC FUNCTION PANEL
AG RATIO: 1.3 (calc) (ref 1.0–2.5)
ALKALINE PHOSPHATASE (APISO): 200 U/L — AB (ref 40–115)
ALT: 34 U/L (ref 9–46)
AST: 18 U/L (ref 10–35)
Albumin: 3.5 g/dL — ABNORMAL LOW (ref 3.6–5.1)
Bilirubin, Direct: 0.1 mg/dL (ref 0.0–0.2)
Globulin: 2.8 g/dL (calc) (ref 1.9–3.7)
Indirect Bilirubin: 0.4 mg/dL (calc) (ref 0.2–1.2)
TOTAL PROTEIN: 6.3 g/dL (ref 6.1–8.1)
Total Bilirubin: 0.5 mg/dL (ref 0.2–1.2)

## 2017-11-05 MED ORDER — METFORMIN HCL ER 500 MG PO TB24: ORAL_TABLET | ORAL | 1 refills | Status: DC

## 2017-11-05 MED ORDER — ESOMEPRAZOLE MAGNESIUM 40 MG PO CPDR: 40.0000 mg | DELAYED_RELEASE_CAPSULE | Freq: Every day | ORAL | 1 refills | Status: DC

## 2017-11-05 NOTE — Progress Notes (Signed)
Subjective:    Patient ID: Daniel Burnett, male    DOB: 13-Jun-1946, 72 y.o.   MRN: [id number]     This nice 72 yo MWM presents with a 2-3 month hx/o post prandial bloating and epigastric discomfort. Which he relates is relieved by belching and passing flattus. He also report usual BM frequency is every 3 day which also helps relieve his discomfort.  Apparently his sx's have gradually worsened since he stopped his Protonix about the same time frame. He denies reflux or water brash or N/V.      He is noted to be on Immediate release Metformin.   Medication Sig  . aspirin 81 MG tablet Take 81 mg by mouth daily.  Marland Kitchen atenolol  25 MG tablet TAKE 1/2 TO 1 TAB DAILY AS DIRECTED  . benazepril ( 40 MG tablet TAKE 1 TAB EVERY DAY FOR BLOOD PRESSURE  . VITAMIN D 2000 UNITS  Take 2,000 Units  2  times daily.   Marland Kitchen glipiZIDE  5 MG tablet TAKE 1/2 TO 1 TAB TWICE DAILY WITH MEALS  . magnesium  500 MG  Take  daily.  . metFORMIN  500 MG tablet TAKE 1 TO 2 TAB TWICE A DAY FOR DIABETES  . NITROSTAT 0.4 MG   as needed for chest pain.  . Omega-3 FISH OIL  Take 1 cap 2 times daily.   . rosuvastatin  20 MG tablet TAKE 0.5 TAB DAILY.  . pantoprazole40 MG tablet Takes  daily. Not taking x 2-3 months   Allergies  Allergen Reactions  . Ppd [Tuberculin Purified Protein Derivative] Other (See Comments)    Positive tb test.  . Tricor [Fenofibrate] Other (See Comments)    GI upset   Past Medical History:  Diagnosis Date  . BPH (benign prostatic hyperplasia)   . CAD (coronary artery disease)    a. Cath 09/2015: 100% stenosis Prox LAD --> 2 DES placed, 90% D2, 99% 2nd Mrg --> DES placed, 70% RCA --> Medically manage  . Colitis   . Diabetes mellitus without complication ([place])   . ED (erectile dysfunction)   . GERD (gastroesophageal reflux disease)   . Hyperlipidemia   . Hypertension   . Kidney stone   . Vitamin D deficiency    Past Surgical History:  Procedure Laterality Date  . CARDIAC CATHETERIZATION N/A  09/27/2015   Procedure: Left Heart Cath and Coronary Angiography;  Surgeon: Leonie Man, MD;  Location: [facility];  Service: Cardiovascular;  Laterality: N/A;  . CARDIAC CATHETERIZATION N/A 09/27/2015   Procedure: Coronary Stent Intervention;  Surgeon: Leonie Man, MD;  Location: [facility];  Service: Cardiovascular;  Laterality: N/A;  Lad/OM2  . CYSTOSCOPY W/ RETROGRADES  1991   stone extractions  . [place]   negative  . SQUAMOUS CELL CARCINOMA EXCISION  2001   bridge of nose   Review of Systems  10 point systems review negative except as above.    Objective:   Physical Exam  BP (!) 116/58   Pulse 64   Temp (!) 97.5 F (36.4 C)   Resp 16   Ht 5\' 9"  (1.753 m)   Wt 167 lb 6.4 oz (75.9 kg)   BMI 24.72 kg/m   HEENT - WNL. Neck - supple.  Chest - Clear equal BS. Cor - Nl HS. RRR w/o sig MGR. PP 1(+). No edema. Abd - Soft w/o distention or  masses, but does have slight EG tenderness. MS- FROM  w/o deformities.  Gait Nl. Neuro -  Nl w/o focal abnormalities.    Assessment & Plan:   1. Pyrosis  - CBC with Differential/Platelet - esomeprazole (NEXIUM) 40 MG capsule; Take 1 capsule (40 mg total) by mouth daily.  Dispense: 30 capsule; Refill: 1  2. Generalized abdominal pain  - CBC with Differential/Platelet - BASIC METABOLIC PANEL WITH GFR - Hepatic function panel  3. Weight loss  - CBC with Differential/Platelet - BASIC METABOLIC PANEL WITH GFR - Hepatic function panel  4. Diabetes mellitus without complication ([place])  - New Rx to change Immediate Release Metformin to the SSR slow release form.   5. Medication management  - CBC with Differential/Platelet - BASIC METABOLIC PANEL WITH GFR - Hepatic function panel

## 2017-11-05 NOTE — Patient Instructions (Addendum)
Abdominal Bloating When you have abdominal bloating, your abdomen may feel full, tight, or painful. It may also look bigger than normal or swollen (distended). Common causes of abdominal bloating include:  Swallowing air.  Constipation.  Problems digesting food.  Eating too much.  Irritable bowel syndrome. This is a condition that affects the large intestine.  Lactose intolerance. This is an inability to digest lactose, a natural sugar in dairy products.  Celiac disease. This is a condition that affects the ability to digest gluten, a protein found in some grains.  Gastroparesis. This is a condition that slows down the movement of food in the stomach and small intestine. It is more common in people with diabetes mellitus.  Gastroesophageal reflux disease (GERD). This is a digestive condition that makes stomach acid flow back into the esophagus.  Urinary retention. This means that the body is holding onto urine, and the bladder cannot be emptied all the way.  Follow these instructions at home: Eating and drinking  Avoid eating too much.  Try not to swallow air while talking or eating.  Avoid eating while lying down.  Avoid these foods and drinks: ? Foods that cause gas, such as broccoli, cabbage, cauliflower, and baked beans. ? Carbonated drinks. ? Hard candy. ? Chewing gum. Medicines  Take over-the-counter and prescription medicines only as told by your health care provider.  Take probiotic medicines. These medicines contain live bacteria or yeasts that can help digestion.  Take coated peppermint oil capsules. Activity  Try to exercise regularly. Exercise may help to relieve bloating that is caused by gas and relieve constipation. General instructions  Keep all follow-up visits as told by your health care provider. This is important. Contact a health care provider if:  You have nausea and vomiting.  You have diarrhea.  You have abdominal pain.  You have  unusual weight loss or weight gain.  You have severe pain, and medicines do not help. Get help right away if:  You have severe chest pain.  You have trouble breathing.  You have shortness of breath.  You have trouble urinating.  You have darker urine than normal.  You have blood in your stools or have dark, tarry stools. Summary  Abdominal bloating means that the abdomen is swollen.  Common causes of abdominal bloating are swallowing air, constipation, and problems digesting food.  Avoid eating too much and avoid swallowing air.  Avoid foods that cause gas, carbonated drinks, hard candy, and chewing gum. This information is not intended to replace advice given to you by your health care provider. Make sure you discuss any questions you have with your health care provider. Document Released: 10/26/2016 Document Revised: 10/26/2016 Document Reviewed: 10/26/2016 Elsevier Interactive Patient Education  2018 [employer].   Heartburn Heartburn is a type of pain or discomfort that can happen in the throat or chest. It is often described as a burning pain. It may also cause a bad taste in the mouth. Heartburn may feel worse when you lie down or bend over, and it is often worse at night. Heartburn may be caused by stomach contents that move back up into the esophagus (reflux). Follow these instructions at home: Take these actions to decrease your discomfort and to help avoid complications. Diet  Follow a diet as recommended by your health care provider. This may involve avoiding foods and drinks such as: ? Coffee and tea (with or without caffeine). ? Drinks that contain alcohol. ? Energy drinks and sports drinks. ? Carbonated  drinks or sodas. ? Chocolate and cocoa. ? Peppermint and mint flavorings. ? Garlic and onions. ? Horseradish. ? Spicy and acidic foods, including peppers, chili powder, curry powder, vinegar, hot sauces, and barbecue sauce. ? Citrus fruit juices and citrus  fruits, such as oranges, lemons, and limes. ? Tomato-based foods, such as red sauce, chili, salsa, and pizza with red sauce. ? Fried and fatty foods, such as donuts, french fries, potato chips, and high-fat dressings. ? High-fat meats, such as hot dogs and fatty cuts of red and white meats, such as rib eye steak, sausage, ham, and bacon. ? High-fat dairy items, such as whole milk, butter, and cream cheese.  Eat small, frequent meals instead of large meals.  Avoid drinking large amounts of liquid with your meals.  Avoid eating meals during the 2-3 hours before bedtime.  Avoid lying down right after you eat.  Do not exercise right after you eat. General instructions  Pay attention to any changes in your symptoms.  Take over-the-counter and prescription medicines only as told by your health care provider. Do not take aspirin, ibuprofen, or other NSAIDs unless your health care provider told you to do so.  Do not use any tobacco products, including cigarettes, chewing tobacco, and e-cigarettes. If you need help quitting, ask your health care provider.  Wear loose-fitting clothing. Do not wear anything tight around your waist that causes pressure on your abdomen.  Raise (elevate) the head of your bed about 6 inches (15 cm).  Try to reduce your stress, such as with yoga or meditation. If you need help reducing stress, ask your health care provider.  If you are overweight, reduce your weight to an amount that is healthy for you. Ask your health care provider for guidance about a safe weight loss goal.  Keep all follow-up visits as told by your health care provider. This is important. Contact a health care provider if:  You have new symptoms.  You have unexplained weight loss.  You have difficulty swallowing, or it hurts to swallow.  You have wheezing or a persistent cough.  Your symptoms do not improve with treatment.  You have frequent heartburn for more than two weeks.

## 2017-11-08 ENCOUNTER — Other Ambulatory Visit: Payer: Self-pay | Admitting: Internal Medicine

## 2017-11-08 ENCOUNTER — Ambulatory Visit
Admission: RE | Admit: 2017-11-08 | Discharge: 2017-11-08 | Disposition: A | Discharge status: Home / Self Care | Payer: PPO | Source: Ambulatory Visit | Attending: Internal Medicine | Admitting: Internal Medicine

## 2017-11-08 DIAGNOSIS — D490 Neoplasm of unspecified behavior of digestive system: Secondary | ICD-10-CM

## 2017-11-08 DIAGNOSIS — R1084 Generalized abdominal pain: Secondary | ICD-10-CM

## 2017-11-08 DIAGNOSIS — R140 Abdominal distension (gaseous): Secondary | ICD-10-CM

## 2017-11-08 DIAGNOSIS — N2889 Other specified disorders of kidney and ureter: Secondary | ICD-10-CM

## 2017-11-08 DIAGNOSIS — K4090 Unilateral inguinal hernia, without obstruction or gangrene, not specified as recurrent: Secondary | ICD-10-CM | POA: Diagnosis not present

## 2017-11-08 DIAGNOSIS — R634 Abnormal weight loss: Secondary | ICD-10-CM

## 2017-11-08 MED ORDER — IOPAMIDOL (ISOVUE-300) INJECTION 61%
100.0000 mL | Freq: Once | INTRAVENOUS | Status: AC | PRN
  Administered 2017-11-08: 100 mL via INTRAVENOUS

## 2017-11-11 ENCOUNTER — Encounter: Payer: Self-pay | Admitting: Internal Medicine

## 2017-11-11 ENCOUNTER — Ambulatory Visit
Admission: RE | Admit: 2017-11-11 | Discharge: 2017-11-11 | Disposition: A | Discharge status: Home / Self Care | Payer: PPO | Source: Ambulatory Visit | Attending: Internal Medicine | Admitting: Internal Medicine

## 2017-11-11 ENCOUNTER — Ambulatory Visit (INDEPENDENT_AMBULATORY_CARE_PROVIDER_SITE_OTHER): Payer: PPO | Admitting: Internal Medicine

## 2017-11-11 ENCOUNTER — Other Ambulatory Visit: Payer: Self-pay | Admitting: Internal Medicine

## 2017-11-11 VITALS — BP 108/66 | HR 64 | Temp 97.5°F | Resp 16 | Ht 69.0 in | Wt 162.2 lb

## 2017-11-11 DIAGNOSIS — K869 Disease of pancreas, unspecified: Secondary | ICD-10-CM | POA: Diagnosis not present

## 2017-11-11 DIAGNOSIS — S93401A Sprain of unspecified ligament of right ankle, initial encounter: Secondary | ICD-10-CM

## 2017-11-11 DIAGNOSIS — C787 Secondary malignant neoplasm of liver and intrahepatic bile duct: Secondary | ICD-10-CM | POA: Diagnosis not present

## 2017-11-11 DIAGNOSIS — K8689 Other specified diseases of pancreas: Secondary | ICD-10-CM

## 2017-11-11 DIAGNOSIS — T1590XA Foreign body on external eye, part unspecified, unspecified eye, initial encounter: Secondary | ICD-10-CM

## 2017-11-11 DIAGNOSIS — Z135 Encounter for screening for eye and ear disorders: Secondary | ICD-10-CM | POA: Diagnosis not present

## 2017-11-11 DIAGNOSIS — C259 Malignant neoplasm of pancreas, unspecified: Secondary | ICD-10-CM | POA: Insufficient documentation

## 2017-11-11 DIAGNOSIS — N2889 Other specified disorders of kidney and ureter: Secondary | ICD-10-CM | POA: Diagnosis not present

## 2017-11-11 MED ORDER — TRAMADOL HCL 50 MG PO TABS: ORAL_TABLET | ORAL | 0 refills | Status: AC

## 2017-11-11 MED ORDER — PREDNISONE 20 MG PO TABS: ORAL_TABLET | ORAL | 0 refills | Status: DC

## 2017-11-11 NOTE — Progress Notes (Signed)
Subjective:    Patient ID: Daniel Burnett, male    DOB: 1946/07/18, 72 y.o.   MRN: [id number]  HPI     Patient presents today for c/o Rt ankle/foot pain for several days which apparently follows a vigorous of working physically about 4 days ago. He denies k/o a discrete injury alleging awoke 3 days ago with a sore ankle/foot.      Patient was seen about a week ago with a myriad of complaints including "stomach" pains, back pains and  fatigue. Labs found and elevated WBC (Polys), elevated Alk p'tase 200 and few RBC (3-10)  on U/A and otherwise neg BMET and HFP.  Abd CT scan was suspect for a Pancreatic Cancer with Liver mets and also a suspect lesion in the Lt Kidney.   Medication Sig  . aspirin 81 MG tablet Take 81 mg by mouth daily.  Marland Kitchen atenolol (TENORMIN) 25 MG tablet TAKE 1/2 TO 1 TABLET BY MOUTH DAILY AS DIRECTED  . benazepril (LOTENSIN) 40 MG tablet TAKE 1 TABLET BY MOUTH EVERY DAY FOR BLOOD PRESSURE  . Cholecalciferol (VITAMIN D) 2000 UNITS CAPS Take 2,000 Units by mouth 2 (two) times daily.   Marland Kitchen esomeprazole (NEXIUM) 40 MG capsule Take 1 capsule (40 mg total) by mouth daily.  Marland Kitchen glipiZIDE (GLUCOTROL) 5 MG tablet TAKE 1/2 TO 1 TABLET BY MOUTH TWICE DAILY WITH MEALS  . magnesium gluconate (MAGONATE) 500 MG tablet Take 500 mg by mouth daily.  . metFORMIN (GLUCOPHAGE XR) 500 MG 24 hr tablet Take 2 tablets 2 x / day for Diabetes  . nitroGLYCERIN (NITROSTAT) 0.4 MG SL tablet Place 1 tablet (0.4 mg total) under the tongue every 5 (five) minutes x 3 doses as needed for chest pain.  . Omega-3 Fatty Acids (FISH OIL PO) Take 1 capsule by mouth 2 (two) times daily.   . ONE TOUCH ULTRA TEST test strip USE ONCE A DAY AS DIRECTED  . rosuvastatin (CRESTOR) 20 MG tablet TAKE 0.5 TABLETS (10 MG TOTAL) BY MOUTH DAILY.  . pantoprazole (PROTONIX) 40 MG tablet Take 40 mg by mouth daily.   Allergies  Allergen Reactions  . Ppd [Tuberculin Purified Protein Derivative] Other (See Comments)    Positive tb  test.  . Tricor [Fenofibrate] Other (See Comments)    GI upset   Past Medical History:  Diagnosis Date  . BPH (benign prostatic hyperplasia)   . CAD (coronary artery disease)    a. Cath 09/2015: 100% stenosis Prox LAD --> 2 DES placed, 90% D2, 99% 2nd Mrg --> DES placed, 70% RCA --> Medically manage  . Colitis   . Diabetes mellitus without complication ([place])   . ED (erectile dysfunction)   . GERD (gastroesophageal reflux disease)   . Hyperlipidemia   . Hypertension   . Kidney stone   . Vitamin D deficiency    Past Surgical History:  Procedure Laterality Date  . CARDIAC CATHETERIZATION N/A 09/27/2015   Procedure: Left Heart Cath and Coronary Angiography;  Surgeon: Leonie Man, MD;  Location: [facility];  Service: Cardiovascular;  Laterality: N/A;  . CARDIAC CATHETERIZATION N/A 09/27/2015   Procedure: Coronary Stent Intervention;  Surgeon: Leonie Man, MD;  Location: [facility];  Service: Cardiovascular;  Laterality: N/A;  Lad/OM2  . CYSTOSCOPY W/ RETROGRADES  1991   stone extractions  . [place]   negative  . SQUAMOUS CELL CARCINOMA EXCISION  2001   bridge of nose   Review of Systems  10 point systems review negative except as above.    Objective:   Physical Exam   BP 108/66   Pulse 64   Temp (!) 97.5 F (36.4 C)   Resp 16   Ht _0  (1.753 m)   Wt 162 lb 3.2 oz (73.6 kg)   BMI 23.95 kg/m   HEENT - WNL. Neck - supple.  Chest - Clear equal BS. Cor - Nl HS. RRR w/o sig MGR. PP 1(+). No edema. MS- FROM w/o deformities.   (+) tender over medial anterior malleolar area. No erythema, STS and PP normal. Gait limping favoring the R foot.  Neuro -  Nl w/o focal abnormalities.    Assessment & Plan:   1. Sprain of right ankle  - predniSONE (DELTASONE) 20 MG tablet; 1 tab 3 x day for 3 days, then 1 tab 2 x day for 3 days, then 1 tab 1 x day for 5 days  Dispense: 20 tablet; Refill: 0  - traMADol (ULTRAM) 50 MG tablet; Take 1  tablet every 4 hours if needed for pain  Dispense: 30 tablet; Refill: 0  2. Pancreatic mass  - scheduling Abd MRI   3. Liver metastasis ([place])   4. Renal mass, left     Long discussion with patient & wife the likelihood of having  a Pancreatic tumor/cancer with liver mets and also suspect for a second cancer in his kidney.

## 2017-11-14 ENCOUNTER — Ambulatory Visit: Payer: Self-pay | Admitting: Adult Health

## 2017-11-15 ENCOUNTER — Ambulatory Visit
Admission: RE | Admit: 2017-11-15 | Discharge: 2017-11-15 | Disposition: A | Discharge status: Home / Self Care | Payer: PPO | Source: Ambulatory Visit | Attending: Internal Medicine | Admitting: Internal Medicine

## 2017-11-15 DIAGNOSIS — D490 Neoplasm of unspecified behavior of digestive system: Secondary | ICD-10-CM

## 2017-11-15 DIAGNOSIS — N2889 Other specified disorders of kidney and ureter: Secondary | ICD-10-CM | POA: Diagnosis not present

## 2017-11-15 MED ORDER — GADOBENATE DIMEGLUMINE 529 MG/ML IV SOLN
15.0000 mL | Freq: Once | INTRAVENOUS | Status: AC | PRN
  Administered 2017-11-15: 15 mL via INTRAVENOUS

## 2017-11-16 ENCOUNTER — Other Ambulatory Visit: Payer: Self-pay | Admitting: Internal Medicine

## 2017-11-16 DIAGNOSIS — C250 Malignant neoplasm of head of pancreas: Secondary | ICD-10-CM

## 2017-11-16 DIAGNOSIS — C787 Secondary malignant neoplasm of liver and intrahepatic bile duct: Secondary | ICD-10-CM

## 2017-11-18 ENCOUNTER — Telehealth: Payer: Self-pay | Admitting: Hematology

## 2017-11-18 ENCOUNTER — Other Ambulatory Visit: Payer: Self-pay | Admitting: *Deleted

## 2017-11-18 MED ORDER — ROSUVASTATIN CALCIUM 20 MG PO TABS: 10.0000 mg | ORAL_TABLET | Freq: Every day | ORAL | 1 refills | Status: AC

## 2017-11-18 NOTE — Telephone Encounter (Signed)
Appt has been scheduled for the pt to see Dr. Burr Medico on 2/12 at 930am. Pt aware to arrive 30 minutes early to be checked in on time.

## 2017-11-19 ENCOUNTER — Inpatient Hospital Stay: Payer: PPO | Attending: Hematology | Admitting: Hematology

## 2017-11-19 ENCOUNTER — Inpatient Hospital Stay: Payer: PPO

## 2017-11-19 ENCOUNTER — Telehealth: Payer: Self-pay | Admitting: Hematology

## 2017-11-19 ENCOUNTER — Encounter: Payer: Self-pay | Admitting: Hematology

## 2017-11-19 DIAGNOSIS — E1122 Type 2 diabetes mellitus with diabetic chronic kidney disease: Secondary | ICD-10-CM | POA: Diagnosis not present

## 2017-11-19 DIAGNOSIS — I129 Hypertensive chronic kidney disease with stage 1 through stage 4 chronic kidney disease, or unspecified chronic kidney disease: Secondary | ICD-10-CM | POA: Diagnosis not present

## 2017-11-19 DIAGNOSIS — R5383 Other fatigue: Secondary | ICD-10-CM | POA: Diagnosis not present

## 2017-11-19 DIAGNOSIS — C259 Malignant neoplasm of pancreas, unspecified: Secondary | ICD-10-CM

## 2017-11-19 DIAGNOSIS — C787 Secondary malignant neoplasm of liver and intrahepatic bile duct: Secondary | ICD-10-CM | POA: Diagnosis not present

## 2017-11-19 DIAGNOSIS — C251 Malignant neoplasm of body of pancreas: Secondary | ICD-10-CM | POA: Diagnosis not present

## 2017-11-19 DIAGNOSIS — N2889 Other specified disorders of kidney and ureter: Secondary | ICD-10-CM | POA: Diagnosis not present

## 2017-11-19 DIAGNOSIS — N189 Chronic kidney disease, unspecified: Secondary | ICD-10-CM | POA: Insufficient documentation

## 2017-11-19 DIAGNOSIS — R933 Abnormal findings on diagnostic imaging of other parts of digestive tract: Secondary | ICD-10-CM

## 2017-11-19 DIAGNOSIS — K5900 Constipation, unspecified: Secondary | ICD-10-CM | POA: Diagnosis not present

## 2017-11-19 DIAGNOSIS — D490 Neoplasm of unspecified behavior of digestive system: Secondary | ICD-10-CM

## 2017-11-19 DIAGNOSIS — R932 Abnormal findings on diagnostic imaging of liver and biliary tract: Secondary | ICD-10-CM

## 2017-11-19 DIAGNOSIS — Z955 Presence of coronary angioplasty implant and graft: Secondary | ICD-10-CM | POA: Diagnosis not present

## 2017-11-19 LAB — CMP (CANCER CENTER ONLY)
ALBUMIN: 3.1 g/dL — AB (ref 3.5–5.0)
ALK PHOS: 193 U/L — AB (ref 40–150)
ALT: 38 U/L (ref 0–55)
ANION GAP: 12 — AB (ref 3–11)
AST: 17 U/L (ref 5–34)
BILIRUBIN TOTAL: 0.6 mg/dL (ref 0.2–1.2)
BUN: 27 mg/dL — ABNORMAL HIGH (ref 7–26)
CALCIUM: 9.9 mg/dL (ref 8.4–10.4)
CO2: 28 mmol/L (ref 22–29)
Chloride: 92 mmol/L — ABNORMAL LOW (ref 98–109)
Creatinine: 0.87 mg/dL (ref 0.70–1.30)
GFR, Est AFR Am: >60 mL/min (ref >60)
GFR, Estimated: >60 mL/min (ref >60)
GLUCOSE: 204 mg/dL — AB (ref 70–140)
Potassium: 4.5 mmol/L (ref 3.5–5.1)
Sodium: 132 mmol/L — ABNORMAL LOW (ref 136–145)
TOTAL PROTEIN: 6.4 g/dL (ref 6.4–8.3)

## 2017-11-19 LAB — CBC WITH DIFFERENTIAL (CANCER CENTER ONLY)
BASOS PCT: 1 %
Basophils Absolute: 0.2 10*3/uL — ABNORMAL HIGH (ref 0.0–0.1)
Eosinophils Absolute: 1.4 10*3/uL — ABNORMAL HIGH (ref 0.0–0.5)
Eosinophils Relative: 5 %
HEMATOCRIT: 40.5 % (ref 38.4–49.9)
HEMOGLOBIN: 12.7 g/dL — AB (ref 13.0–17.1)
LYMPHS ABS: 1.3 10*3/uL (ref 0.9–3.3)
LYMPHS PCT: 5 %
MCH: 24.8 pg — AB (ref 27.2–33.4)
MCHC: 31.5 g/dL — AB (ref 32.0–36.0)
MCV: 78.9 fL — AB (ref 79.3–98.0)
MONOS PCT: 10 %
Monocytes Absolute: 2.5 10*3/uL — ABNORMAL HIGH (ref 0.1–0.9)
NEUTROS ABS: 21 10*3/uL — AB (ref 1.5–6.5)
Neutrophils Relative %: 79 %
Platelet Count: 341 10*3/uL (ref 140–400)
RBC: 5.13 MIL/uL (ref 4.20–5.82)
RDW: 15.3 % — ABNORMAL HIGH (ref 11.0–14.6)
WBC Count: 26.4 10*3/uL — ABNORMAL HIGH (ref 4.0–10.3)

## 2017-11-19 NOTE — Telephone Encounter (Signed)
Scheduled appt per 2/12 los - Gave pt AVS and calender per los. - pt is aware central radiology to contact patient with scan and liver biopsy

## 2017-11-19 NOTE — Progress Notes (Signed)
  Oncology Nurse Navigator Documentation  Navigator Location: [facility] (11/19/17 0930) Referral date to RadOnc/MedOnc: 11/16/17 (11/19/17 0930) )Navigator Encounter Type: Initial MedOnc (11/19/17 0930)   Abnormal Finding Date: 11/08/17 (11/19/17 0930)                 Patient Visit Type: MedOnc;Initial (11/19/17 0930) Treatment Phase: Pre-Tx/Tx Discussion (11/19/17 0930) Barriers/Navigation Needs: Education (11/19/17 0930) Education: Coping with Diagnosis/ Prognosis;Other(Port-A-Cath education) (11/19/17 0930) Interventions: Education;Psycho-social support (11/19/17 0930)  Patient and wife did not know of the extent of the CT and MRI results prior to their appointment today. We spent time processing the information and discussing the role of chemo versus no treatment. Patient shared that he knows people who have gone through chemo treatment and have experienced uncontrolled pain. I educated them on the nausea medications typically used in treatment along with the role the symptom management clinic plays in treatment. Patient and wife agreed that they will proceed with the plan Dr. Burr Medico recommended. Patient and wife verbalized understanding that they can call with questions or concerns.          Acuity: Level 2 (11/19/17 0930)   Acuity Level 2: Initial guidance, education and coordination as needed;Educational needs (11/19/17 0930)     Time Spent with Patient: 30 (11/19/17 0930)

## 2017-11-19 NOTE — Progress Notes (Signed)
[place]  [phone] Fax:([phone]) [place] Note   Patient Care Team: Unk Pinto, MD as PCP - General (Internal Medicine) Melissa Noon, [place] as Referring Physician (Optometry) Teena Irani, MD (Inactive) as Consulting Physician (Gastroenterology) 11/19/2017  Referring physician: Unk Pinto, MD  CHIEF COMPLAINTS/PURPOSE OF CONSULTATION:  Pancreatic and liver masses  HISTORY OF PRESENTING ILLNESS:  Daniel Burnett 72 y.o. male is here because of his recent abnormal findings.  He was referred by his primary care physician.  He presents to my clinic with his wife today.  He complains intermittent epigastric pain and mid back pian for the past 3 months, especially after eating, and he feels gasy. Mild nausea, no vomiting. He has constipation also, but it's chronic, no melana or blood in stool.  He has lost appetite and lost about 15 lbs in the past 2 weeks. He has moderate fatigue, able to do ADLs and light activities, but less active lately.  He is retired, but still works for his [employer], but had to stop working 1 week ago due to fatigue.  He denies any fever, chills.   Was seen by his primary care physician but Dr. Melford Aase.  CT abdomen and pelvis was done on November 08, 2017, which showed a 6.4 cm low-attenuation pancreatic body mass and numerous liver lesions suspicious for metastatic disease.  There is a 1.8 cm indeterminate left renal mass.  Abdominal MRI with and without contrast was subsequently obtained on November 16, 2017, and the finding was similar to CT.  He has not had a tissue biopsy yet.  MEDICAL HISTORY:  Past Medical History:  Diagnosis Date  . BPH (benign prostatic hyperplasia)   . CAD (coronary artery disease)    a. Cath 09/2015: 100% stenosis Prox LAD --> 2 DES placed, 90% D2, 99% 2nd Mrg --> DES placed, 70% RCA --> Medically manage  . Colitis   . Diabetes mellitus without complication ([place])   . ED  (erectile dysfunction)   . GERD (gastroesophageal reflux disease)   . Hyperlipidemia   . Hypertension   . Kidney stone   . Vitamin D deficiency     SURGICAL HISTORY: Past Surgical History:  Procedure Laterality Date  . CARDIAC CATHETERIZATION N/A 09/27/2015   Procedure: Left Heart Cath and Coronary Angiography;  Surgeon: Leonie Man, MD;  Location: [facility];  Service: Cardiovascular;  Laterality: N/A;  . CARDIAC CATHETERIZATION N/A 09/27/2015   Procedure: Coronary Stent Intervention;  Surgeon: Leonie Man, MD;  Location: [facility];  Service: Cardiovascular;  Laterality: N/A;  Lad/OM2  . CYSTOSCOPY W/ RETROGRADES  1991   stone extractions  . [place]   negative  . SQUAMOUS CELL CARCINOMA EXCISION  2001   bridge of nose    SOCIAL HISTORY: Social History   Socioeconomic History  . Marital status: Married    Spouse name: Not on file  . Number of children: Not on file  . Years of education: Not on file  . Highest education level: Not on file  Social Needs  . Financial resource strain: Not on file  . Food insecurity - worry: Not on file  . Food insecurity - inability: Not on file  . Transportation needs - medical: Not on file  . Transportation needs - non-medical: Not on file  Occupational History  . Not on file  Tobacco Use  . Smoking status: Former Smoker    Last attempt to quit:  10/08/1962    Years since quitting: 55.1  . Smokeless tobacco: Never Used  Substance and Sexual Activity  . Alcohol use: No  . Drug use: No  . Sexual activity: Not on file  Other Topics Concern  . Not on file  Social History Narrative  . Not on file    FAMILY HISTORY: Family History  Problem Relation Age of Onset  . Heart disease Mother   . Heart attack Mother   . Heart disease Father   . Heart attack Father   . Heart disease Brother   . Stroke Brother   . Hypertension Brother   . Diabetes Maternal Aunt   . Cancer Cousin   . Leukemia  Cousin     ALLERGIES:  is allergic to ppd [tuberculin purified protein derivative] and tricor [fenofibrate].  MEDICATIONS:  Current Outpatient Medications  Medication Sig Dispense Refill  . aspirin 81 MG tablet Take 81 mg by mouth daily.    Marland Kitchen atenolol (TENORMIN) 25 MG tablet TAKE 1/2 TO 1 TABLET BY MOUTH DAILY AS DIRECTED 90 tablet 1  . benazepril (LOTENSIN) 40 MG tablet TAKE 1 TABLET BY MOUTH EVERY DAY FOR BLOOD PRESSURE 90 tablet 3  . Cholecalciferol (VITAMIN D) 2000 UNITS CAPS Take 2,000 Units by mouth 2 (two) times daily.     Marland Kitchen esomeprazole (NEXIUM) 40 MG capsule Take 1 capsule (40 mg total) by mouth daily. 30 capsule 1  . glipiZIDE (GLUCOTROL) 5 MG tablet TAKE 1/2 TO 1 TABLET BY MOUTH TWICE DAILY WITH MEALS 180 tablet 1  . magnesium gluconate (MAGONATE) 500 MG tablet Take 500 mg by mouth daily.    . metFORMIN (GLUCOPHAGE XR) 500 MG 24 hr tablet Take 2 tablets 2 x / day for Diabetes 360 tablet 1  . nitroGLYCERIN (NITROSTAT) 0.4 MG SL tablet Place 1 tablet (0.4 mg total) under the tongue every 5 (five) minutes x 3 doses as needed for chest pain. 25 tablet 2  . Omega-3 Fatty Acids (FISH OIL PO) Take 1 capsule by mouth 2 (two) times daily.     . ONE TOUCH ULTRA TEST test strip USE ONCE A DAY AS DIRECTED 100 each 5  . predniSONE (DELTASONE) 20 MG tablet 1 tab 3 x day for 3 days, then 1 tab 2 x day for 3 days, then 1 tab 1 x day for 5 days 20 tablet 0  . rosuvastatin (CRESTOR) 20 MG tablet Take 0.5 tablets (10 mg total) by mouth daily. 90 tablet 1  . traMADol (ULTRAM) 50 MG tablet Take 1 tablet every 4 hours if needed for pain 30 tablet 0   No current facility-administered medications for this visit.     REVIEW OF SYSTEMS:   Constitutional: Denies fevers, chills or abnormal night sweats, (+) fatigue and a 15 pounds weight loss in the past 2 weeks. Eyes: Denies blurriness of vision, double vision or watery eyes Ears, nose, mouth, throat, and face: Denies mucositis or sore  throat Respiratory: Denies cough, dyspnea or wheezes Cardiovascular: Denies palpitation, chest discomfort or lower extremity swelling Gastrointestinal:  See H&P Skin: Denies abnormal skin rashes Lymphatics: Denies new lymphadenopathy or easy bruising Neurological:Denies numbness, tingling or new weaknesses Behavioral/Psych: Mood is stable, no new changes  All other systems were reviewed with the patient and are negative.  PHYSICAL EXAMINATION: ECOG PERFORMANCE STATUS: 2 - Symptomatic, <50% confined to bed  Vitals:   11/19/17 0906  BP: 132/64  Pulse: 62  Resp: 18  Temp: 97.6 F (36.4 C)  SpO2:  99%   Filed Weights   11/19/17 0906  Weight: 164 lb 9.6 oz (74.7 kg)    GENERAL:alert, no distress and comfortable SKIN: skin color, texture, turgor are normal, no rashes or significant lesions EYES: normal, conjunctiva are pink and non-injected, sclera clear OROPHARYNX:no exudate, no erythema and lips, buccal mucosa, and tongue normal  NECK: supple, thyroid normal size, non-tender, without nodularity LYMPH:  no palpable lymphadenopathy in the cervical, axillary or inguinal LUNGS: clear to auscultation and percussion with normal breathing effort HEART: regular rate & rhythm and no murmurs and no lower extremity edema ABDOMEN:abdomen soft, moderate tenderness in the epigastric area, no hepatomegaly or palpable mass Musculoskeletal:no cyanosis of digits and no clubbing  PSYCH: alert & oriented x 3 with fluent speech NEURO: no focal motor/sensory deficits  LABORATORY DATA:  I have reviewed the data as listed CBC Latest Ref Rng & Units 11/19/2017 11/05/2017 08/05/2017  WBC 4.0 - 10.3 K/uL 26.4(H) 15.0(H) 11.8(H)  Hemoglobin 13.2 - 17.1 g/dL - 11.3(L) 14.9  Hematocrit 38.4 - 49.9 % 40.5 34.6(L) 43.2  Platelets 140 - 400 K/uL 341 343 293   CMP Latest Ref Rng & Units 11/19/2017 11/05/2017 08/05/2017  Glucose 70 - 140 mg/dL 204(H) 105(H) 109(H)  BUN 7 - 26 mg/dL 27(H) 19 21  Creatinine  0.70 - 1.30 mg/dL 0.87 0.78 1.00  Sodium 136 - 145 mmol/L 132(L) 136 138  Potassium 3.5 - 5.1 mmol/L 4.5 4.5 4.8  Chloride 98 - 109 mmol/L 92(L) 99 97(L)  CO2 22 - 29 mmol/L 28 28 31   Calcium 8.4 - 10.4 mg/dL 9.9 9.6 10.0  Total Protein 6.4 - 8.3 g/dL 6.4 6.3 7.0  Total Bilirubin 0.2 - 1.2 mg/dL 0.6 0.5 0.7  Alkaline Phos 40 - 150 U/L 193(H) - -  AST 5 - 34 U/L 17 18 13   ALT 0 - 55 U/L 38 34 15    RADIOGRAPHIC STUDIES: I have personally reviewed the radiological images as listed and agreed with the findings in the report. Dg Eye Foreign Body  Result Date: 11/11/2017 CLINICAL DATA:  Metal working/exposure; clearance prior to MRI EXAM: ORBITS FOR FOREIGN BODY - 2 VIEW COMPARISON:  None. FINDINGS: There is no evidence of metallic foreign body within the orbits. No significant bone abnormality identified. IMPRESSION: No evidence of metallic foreign body within the orbits. Electronically Signed   By: Kristine Garbe M.D.   On: 11/11/2017 15:40   Mr Abdomen W Wo Contrast  Result Date: 11/16/2017 CLINICAL DATA:  Evaluate pancreatic mass and liver lesions seen on recent CT scan. EXAM: MRI ABDOMEN WITHOUT AND WITH CONTRAST TECHNIQUE: Multiplanar multisequence MR imaging of the abdomen was performed both before and after the administration of intravenous contrast. CONTRAST:  25m MULTIHANCE GADOBENATE DIMEGLUMINE 529 MG/ML IV SOLN COMPARISON:  CT scan 11/08/2017 FINDINGS: Lower chest: No obvious lung lesions. No pleural or pericardial effusion. Hepatobiliary: Numerous large liver lesions as demonstrated on the CT scan consistent with liver metastasis. Large confluent mass at the hepatic dome measures 9.8 x 8.0 x 8.2 cm. Slightly more inferior and lateral lesion measures 2.8 x 2.4 cm. 2.5 x 1.7 cm lesion in the caudate lobe. There are also 2 smaller segment 6 lesions. Pancreas: As demonstrated on the CT scan there is a large necrotic appearing lesion in the pancreatic body measuring 4.2 x 3.0 cm.  This is likely the primary neoplasm and associated hepatic metastatic disease. There is pancreatic ductal dilatation proximal to the lesion. The pancreatic head is normal. No common  bile duct dilatation. Spleen:  Mildly enlarged measuring 16.3 x 9.0 x 7.4 cm. No focal lesions. Adrenals/Urinary Tract: The adrenal glands and kidneys are unremarkable. There is a 2.0 x 1.7 cm partially cystic lesion projecting off the midpole lower pole junction region of the left kidney posteriorly. The non-cystic portion demonstrates contrast enhancement and is worrisome for a small renal neoplasm. The right kidney is unremarkable. Stomach/Bowel: Visualized portions within the abdomen are unremarkable. Vascular/Lymphatic: Advanced atherosclerotic changes involving the aorta. The branch vessels are patent. The major venous structures are patent. No enlarged mesenteric or retroperitoneal lymph nodes are identified. Other:  No ascites or abdominal wall hernia. Musculoskeletal: No findings suspicious for hepatic metastatic disease. IMPRESSION: 1. 4.2 x 3.0 cm cystic appearing pancreatic body mass likely reflecting pancreatic cancer with associated extensive hepatic metastatic disease. 2. 2.0 x 1.7 cm partially cystic but enhancing left renal lesion consistent with small renal neoplasm. 3. No abdominal lymphadenopathy. Electronically Signed   By: Marijo Sanes M.D.   On: 11/16/2017 09:32   Ct Abdomen Pelvis W Contrast  Result Date: 11/08/2017 CLINICAL DATA:  Abdominal pain after eating. Weight loss. Constipation, nausea for 3 months. EXAM: CT ABDOMEN AND PELVIS WITH CONTRAST TECHNIQUE: Multidetector CT imaging of the abdomen and pelvis was performed using the standard protocol following bolus administration of intravenous contrast. CONTRAST:  139m ISOVUE-300 IOPAMIDOL (ISOVUE-300) INJECTION 61% COMPARISON:  Chest x-ray 09/27/2015 FINDINGS: Lower chest: There is minimal right lower lobe atelectasis. No suspicious pulmonary nodules are  identified at the bases. There is significant coronary atherosclerotic calcification and/or stents. Hepatobiliary: Numerous liver lesions are present. Multiple confluent lesions at the dome of the right hepatic lobe together measures 7.5 x 10.2 centimeters. A small lesion in the right hepatic lobe is 2.7 centimeters. Left hepatic lobe lesion is 2.8 centimeters. The gallbladder is present. Lesions show indistinct peripheral margins and lack characteristics of benign liver lesions, suspicious for metastatic disease. Pancreas: There is a low-attenuation lesion involving the pancreatic body measuring 3.3 x 6.4 x 3.5 centimeters. No peripancreatic stranding or other evidence for acute pancreatitis. No pancreatic calcifications indicate chronic pancreatitis. Spleen: The spleen is enlarged, without focal lesion. Adrenals/Urinary Tract: Adrenal glands are normal in appearance. Within the midpole region of the left kidney, there is a cystic mass containing at least a single septation measuring 1.8 x 1.5 centimeters an indeterminate by CT. Suspect nonobstructing intrarenal calculus in the lower pole the right kidney, 3 millimeters in diameter. Suspect nonobstructing calculus in the lower pole the left kidney, measuring 1.4 centimeters. Alternatively, this could represent early excretion of contrast on this contrast-enhanced exam. No evidence for urinary tract obstruction. The urinary bladder is unremarkable in appearance. Stomach/Bowel: The stomach and small bowel loops are normal in appearance. There are numerous sigmoid diverticula. No acute diverticulitis. Vascular/Lymphatic: There is dense atherosclerotic calcification of the abdominal aorta not associated with aneurysm. Small portacaval lymph nodes are identified, measuring less than 1 centimeter in short axis. Reproductive: The prostate is enlarged. Fat containing bilateral inguinal hernias. No free pelvic fluid. Other: None Musculoskeletal: No suspicious lytic or  blastic lesions are identified. There are moderate degenerative changes throughout the lower thoracic and lumbar spine. IMPRESSION: 1. 6.4 centimeter low-attenuation pancreatic mass suspicious for neoplasm. Although focal pancreatitis/pseudocysts are considerations, given the lack of secondary signs of pancreatitis, malignancy is favored. Recommend further evaluation with MRI. 2. Numerous liver lesions suspicious for metastatic disease. 3. Splenomegaly. 4. Indeterminate left renal mass measuring 1.8 centimeters, warranting further characterization. Recommend further evaluation with  renal protocol MRI. 5.  Aortic atherosclerosis.  (ICD10-I70.0) 6. Prostatic enlargement. 7. Bilateral fat containing inguinal hernias. 8. Coronary artery atherosclerosis. Electronically Signed   By: Nolon Nations M.D.   On: 11/08/2017 14:40    ASSESSMENT & PLAN: 72 y.o. patient male, with past medical history of coronary artery disease, status post stent placement in 2016, hypertension, diabetes, CKD, presented with epigastric pain, fatigue, low appetite and weight loss  1.  Pancreatic mass and diffuse liver lesions, likely metastatic pancreatic cancer to liver -Have reviewed his abdominal CT and MRI images and findings with patient and he his wife in person.  This is highly suspicious for metastatic pancreatic cancer.  -Recommend tissue biopsy to confirm diagnosis.  Given the diffuse liver lesions, which is more accessible, I recommend ultrasound-guided liver biopsy by interventional radiology.  He is agreeable. Referral made today. -I will repeat his CMP, CBC and check Ca19.9 today -Unfortunately this is incurable disease, if biopsy confirms malignancy.  The goal of therapy is likely palliative. -Briefly discussed the systemic therapy options for metastatic pancreatic cancer, including different regiment of chemotherapy, immunotherapy if it is is MSI-H disease  -I will him to genetics to check BRCA mutation, to see if he  is a candidate for PARP inhibitor  -I will obtain a CT chest without contrast to complete a staging -If biopsy confirms pancreatic cancer, I recommend a port placement for chemotherapy.  Pt is agreeable.  2. Left renal lesion -Likely neoplasm, based on the MRI, metastatic disease less likely  -due to his metastatic pancreatic cancer, will watch the renal lesion, and defer treatment for now   3. CAD, DM, HTN  -f/u with PCP -we discussed that his blood glucose may get worse due to his pancreatic cancer, will monitor closely   Plan -CT chest without contrast to complete a staging -Today including CBC, CMP and CA 19.9 -Ultrasound-guided liver lesion biopsy by interventional radiology as soon as possible -We will see him back later next week after the biopsy -We will arrange port placement by IR if biopsy confirms cancer  All questions were answered. The patient knows to call the clinic with any problems, questions or concerns. I spent 55 minutes counseling the patient face to face. The total time spent in the appointment was 60 minutes and more than 50% was on counseling.     Truitt Merle, MD 11/19/2017

## 2017-11-20 ENCOUNTER — Other Ambulatory Visit: Payer: Self-pay | Admitting: Internal Medicine

## 2017-11-20 ENCOUNTER — Encounter: Payer: Self-pay | Admitting: Hematology

## 2017-11-20 LAB — CANCER ANTIGEN 19-9: CAN 19-9: 1 U/mL (ref 0–35)

## 2017-11-20 NOTE — Progress Notes (Signed)
  Oncology Nurse Navigator Documentation  Navigator Location: [facility] (11/20/17 1506)   )Navigator Encounter Type: Telephone (11/20/17 1506) Telephone: [facility] Call;Appt Confirmation/Clarification (11/20/17 1506)                     Treatment Phase: Pre-Tx/Tx Discussion (11/20/17 1506)     Interventions: Coordination of Care;Psycho-social support (11/20/17 1506)  Called patient to advise of D/T for CT scan. 11/22/17 @ 4 PM to arrive at 3:45 PM.  Mrs. Yang shared that patient has been throwing up and not sleeping well. Message sent to Dr. Burr Medico and nurse.          Acuity: Level 2 (11/20/17 1506)   Acuity Level 2: Ongoing guidance and education throughout treatment as needed (11/20/17 1506)     Time Spent with Patient: 15 (11/20/17 1506)

## 2017-11-21 ENCOUNTER — Other Ambulatory Visit: Payer: Self-pay

## 2017-11-21 MED ORDER — PROCHLORPERAZINE MALEATE 10 MG PO TABS: 10.0000 mg | ORAL_TABLET | Freq: Four times a day (QID) | ORAL | 1 refills | Status: AC | PRN

## 2017-11-21 NOTE — Progress Notes (Signed)
  Oncology Nurse Navigator Documentation  Navigator Location: [facility] (11/21/17 5374)   )Navigator Encounter Type: Telephone (11/21/17 0943) Telephone: Outgoing Call;Symptom Mgt (11/21/17 0943)                         Education: Pain/ Symptom Management (11/21/17 8270) Interventions: Psycho-social support (11/21/17 0943)  Called and spoke with Mr. Spiker to educate on Compazine. Patient asked about pain medicine. He had not been taking his Ultram for fear of constipation. Patient agreed to try Miralax and Senakot so that he can take his Ultram. I spoke with his wife to educate her as well. She agreed to call back with questions or concerns.          Acuity: Level 2 (11/21/17 0943)         Time Spent with Patient: 15 (11/21/17 0943)

## 2017-11-21 NOTE — Progress Notes (Signed)
  Oncology Nurse Navigator Documentation  Navigator Location: [facility] (11/21/17 1652)   )Navigator Encounter Type: Telephone (11/21/17 1652) Telephone: [facility] Call;Appt Confirmation/Clarification (11/21/17 1652)                           Interventions: Coordination of Care;Psycho-social support (11/21/17 1652)   Coordination of Care: Appts (11/21/17 1652)   Called Mrs. Koelling to discuss changes to schedule.      Acuity: Level 2 (11/21/17 1652)         Time Spent with Patient: 15 (11/21/17 1652)

## 2017-11-21 NOTE — Progress Notes (Signed)
  Oncology Nurse Navigator Documentation  Navigator Location: [facility] (11/21/17 0932)   )Navigator Encounter Type: Telephone (11/21/17 0932) Telephone: Outgoing Call;Medication Assistance (11/21/17 0932)                     Treatment Phase: Pre-Tx/Tx Discussion (11/21/17 0932)   Education: Pain/ Symptom Management (11/21/17 0932) Interventions: Medication Assistance (11/21/17 0932)  Called and spoke with wife, Rickie about patient's nausea and new diagnosis. Prescription for Compazine sent to patient's pharmacy per Dr. Burr Medico.          Acuity: Level 2 (11/21/17 0932)         Time Spent with Patient: 30 (11/21/17 0932)

## 2017-11-22 ENCOUNTER — Ambulatory Visit (HOSPITAL_COMMUNITY)
Admission: RE | Admit: 2017-11-22 | Discharge: 2017-11-22 | Disposition: A | Discharge status: Home / Self Care | Payer: PPO | Source: Ambulatory Visit | Attending: Hematology | Admitting: Hematology

## 2017-11-22 DIAGNOSIS — D490 Neoplasm of unspecified behavior of digestive system: Secondary | ICD-10-CM

## 2017-11-22 DIAGNOSIS — N200 Calculus of kidney: Secondary | ICD-10-CM | POA: Insufficient documentation

## 2017-11-22 DIAGNOSIS — R160 Hepatomegaly, not elsewhere classified: Secondary | ICD-10-CM | POA: Diagnosis not present

## 2017-11-22 DIAGNOSIS — I2510 Atherosclerotic heart disease of native coronary artery without angina pectoris: Secondary | ICD-10-CM | POA: Insufficient documentation

## 2017-11-22 DIAGNOSIS — C259 Malignant neoplasm of pancreas, unspecified: Secondary | ICD-10-CM | POA: Diagnosis not present

## 2017-11-22 DIAGNOSIS — I700 Atherosclerosis of aorta: Secondary | ICD-10-CM | POA: Insufficient documentation

## 2017-11-26 ENCOUNTER — Other Ambulatory Visit: Payer: Self-pay | Admitting: Radiology

## 2017-11-26 ENCOUNTER — Other Ambulatory Visit: Payer: Self-pay | Admitting: General Surgery

## 2017-11-27 ENCOUNTER — Encounter (HOSPITAL_COMMUNITY): Payer: Self-pay

## 2017-11-27 ENCOUNTER — Ambulatory Visit (HOSPITAL_COMMUNITY)
Admission: RE | Admit: 2017-11-27 | Discharge: 2017-11-27 | Disposition: A | Discharge status: Home / Self Care | Payer: PPO | Source: Ambulatory Visit | Attending: Hematology | Admitting: Hematology

## 2017-11-27 DIAGNOSIS — K219 Gastro-esophageal reflux disease without esophagitis: Secondary | ICD-10-CM | POA: Diagnosis not present

## 2017-11-27 DIAGNOSIS — Z9889 Other specified postprocedural states: Secondary | ICD-10-CM | POA: Insufficient documentation

## 2017-11-27 DIAGNOSIS — C801 Malignant (primary) neoplasm, unspecified: Secondary | ICD-10-CM | POA: Diagnosis not present

## 2017-11-27 DIAGNOSIS — C259 Malignant neoplasm of pancreas, unspecified: Secondary | ICD-10-CM | POA: Insufficient documentation

## 2017-11-27 DIAGNOSIS — Z806 Family history of leukemia: Secondary | ICD-10-CM | POA: Diagnosis not present

## 2017-11-27 DIAGNOSIS — Z833 Family history of diabetes mellitus: Secondary | ICD-10-CM | POA: Diagnosis not present

## 2017-11-27 DIAGNOSIS — Z823 Family history of stroke: Secondary | ICD-10-CM | POA: Diagnosis not present

## 2017-11-27 DIAGNOSIS — E559 Vitamin D deficiency, unspecified: Secondary | ICD-10-CM | POA: Insufficient documentation

## 2017-11-27 DIAGNOSIS — Z955 Presence of coronary angioplasty implant and graft: Secondary | ICD-10-CM | POA: Insufficient documentation

## 2017-11-27 DIAGNOSIS — C787 Secondary malignant neoplasm of liver and intrahepatic bile duct: Secondary | ICD-10-CM | POA: Diagnosis not present

## 2017-11-27 DIAGNOSIS — E785 Hyperlipidemia, unspecified: Secondary | ICD-10-CM | POA: Insufficient documentation

## 2017-11-27 DIAGNOSIS — Z888 Allergy status to other drugs, medicaments and biological substances status: Secondary | ICD-10-CM | POA: Insufficient documentation

## 2017-11-27 DIAGNOSIS — Z87442 Personal history of urinary calculi: Secondary | ICD-10-CM | POA: Diagnosis not present

## 2017-11-27 DIAGNOSIS — Z87891 Personal history of nicotine dependence: Secondary | ICD-10-CM | POA: Insufficient documentation

## 2017-11-27 DIAGNOSIS — K7689 Other specified diseases of liver: Secondary | ICD-10-CM | POA: Diagnosis not present

## 2017-11-27 DIAGNOSIS — I2510 Atherosclerotic heart disease of native coronary artery without angina pectoris: Secondary | ICD-10-CM | POA: Insufficient documentation

## 2017-11-27 DIAGNOSIS — N400 Enlarged prostate without lower urinary tract symptoms: Secondary | ICD-10-CM | POA: Diagnosis not present

## 2017-11-27 DIAGNOSIS — E119 Type 2 diabetes mellitus without complications: Secondary | ICD-10-CM | POA: Insufficient documentation

## 2017-11-27 DIAGNOSIS — Z8249 Family history of ischemic heart disease and other diseases of the circulatory system: Secondary | ICD-10-CM | POA: Insufficient documentation

## 2017-11-27 DIAGNOSIS — I10 Essential (primary) hypertension: Secondary | ICD-10-CM | POA: Diagnosis not present

## 2017-11-27 DIAGNOSIS — R109 Unspecified abdominal pain: Secondary | ICD-10-CM | POA: Insufficient documentation

## 2017-11-27 LAB — CBC
HEMATOCRIT: 36.8 % — AB (ref 39.0–52.0)
HEMOGLOBIN: 11.4 g/dL — AB (ref 13.0–17.0)
MCH: 25.1 pg — ABNORMAL LOW (ref 26.0–34.0)
MCHC: 31 g/dL (ref 30.0–36.0)
MCV: 81.1 fL (ref 78.0–100.0)
Platelets: 240 10*3/uL (ref 150–400)
RBC: 4.54 MIL/uL (ref 4.22–5.81)
RDW: 15 % (ref 11.5–15.5)
WBC: 17.4 10*3/uL — AB (ref 4.0–10.5)

## 2017-11-27 LAB — PROTIME-INR
INR: 1.06
Prothrombin Time: 13.7 seconds (ref 11.4–15.2)

## 2017-11-27 LAB — GLUCOSE, CAPILLARY: GLUCOSE-CAPILLARY: 110 mg/dL — AB (ref 65–99)

## 2017-11-27 MED ORDER — FENTANYL CITRATE (PF) 100 MCG/2ML IJ SOLN
INTRAMUSCULAR | Status: AC
  Filled 2017-11-27: qty 4

## 2017-11-27 MED ORDER — SODIUM CHLORIDE 0.9 % IV SOLN
INTRAVENOUS | Status: DC
  Administered 2017-11-27: 14:00:00 via INTRAVENOUS

## 2017-11-27 MED ORDER — MIDAZOLAM HCL 2 MG/2ML IJ SOLN
INTRAMUSCULAR | Status: AC
  Filled 2017-11-27: qty 4

## 2017-11-27 MED ORDER — FENTANYL CITRATE (PF) 100 MCG/2ML IJ SOLN
INTRAMUSCULAR | Status: AC | PRN
  Administered 2017-11-27 (×2): 50 ug via INTRAVENOUS

## 2017-11-27 MED ORDER — MIDAZOLAM HCL 2 MG/2ML IJ SOLN
INTRAMUSCULAR | Status: AC | PRN
  Administered 2017-11-27 (×2): 1 mg via INTRAVENOUS

## 2017-11-27 MED ORDER — HYDROCODONE-ACETAMINOPHEN 5-325 MG PO TABS: 1.0000 | ORAL_TABLET | ORAL | Status: DC | PRN

## 2017-11-27 MED ORDER — LIDOCAINE HCL (PF) 1 % IJ SOLN
INTRAMUSCULAR | Status: AC
  Filled 2017-11-27: qty 30

## 2017-11-27 MED ORDER — GELATIN ABSORBABLE 12-7 MM EX MISC
CUTANEOUS | Status: AC
  Filled 2017-11-27: qty 1

## 2017-11-27 NOTE — Sedation Documentation (Signed)
Patient is resting comfortably. 

## 2017-11-27 NOTE — Sedation Documentation (Signed)
Patient denies pain and is resting comfortably.  

## 2017-11-27 NOTE — H&P (Signed)
Chief Complaint: liver lesion  Referring Physician:Dr. Truitt Merle  Supervising Physician: Markus Daft  Patient Status: [name]  HPI: Daniel Burnett is a 72 y.o. male who has had abdominal pain over the last several months.  He had a CT scan recently that revealed a pancreatic mass and liver lesions.  He has then had an MR of the abdomen and CT of the chest.  This confirmed the areas seen on CT in the pancreas and liver.  No disease seen in the chest.  He has been referred to oncology who has referred him here today for a biopsy to confirm a diagnosis.  He has no complaints except occasional abdominal pain.  Past Medical History:  Past Medical History:  Diagnosis Date  . BPH (benign prostatic hyperplasia)   . CAD (coronary artery disease)    a. Cath 09/2015: 100% stenosis Prox LAD --> 2 DES placed, 90% D2, 99% 2nd Mrg --> DES placed, 70% RCA --> Medically manage  . Colitis   . Diabetes mellitus without complication ([place])   . ED (erectile dysfunction)   . GERD (gastroesophageal reflux disease)   . Hyperlipidemia   . Hypertension   . Kidney stone   . Vitamin D deficiency     Past Surgical History:  Past Surgical History:  Procedure Laterality Date  . CARDIAC CATHETERIZATION N/A 09/27/2015   Procedure: Left Heart Cath and Coronary Angiography;  Surgeon: Leonie Man, MD;  Location: [place] [facility];  Service: Cardiovascular;  Laterality: N/A;  . CARDIAC CATHETERIZATION N/A 09/27/2015   Procedure: Coronary Stent Intervention;  Surgeon: Leonie Man, MD;  Location: [place] [facility];  Service: Cardiovascular;  Laterality: N/A;  [place]ad/OM2  . CYSTOSCOPY W/ RETROGRADES  1991   stone extractions  . [place]   negative  . SQUAMOUS CELL CARCINOMA EXCISION  2001   bridge of nose    Family History:  Family History  Problem Relation Age of Onset  . Heart disease Mother   . Heart attack Mother   . Heart disease Father   . Heart attack Father   .  Heart disease Brother   . Stroke Brother   . Hypertension Brother   . Diabetes Maternal Aunt   . Cancer Cousin 45       breast cancer   . Leukemia Cousin     Social History:  reports that he quit smoking about 55 years ago. He has a 1.25 pack-year smoking history. he has never used smokeless tobacco. He reports that he does not drink alcohol or use drugs.  Allergies:  Allergies  Allergen Reactions  . Ppd [Tuberculin Purified Protein Derivative] Other (See Comments)    Positive tb test.  . Tricor [Fenofibrate] Other (See Comments)    GI upset    Medications: Medications reviewed in epic  Please HPI for pertinent positives, otherwise complete 10 system ROS negative.  Mallampati Score: MD Evaluation Airway: WNL Heart: WNL Abdomen: WNL Chest/ Lungs: WNL ASA  Classification: 3 Mallampati/Airway Score: One  Physical Exam: BP (!) 121/58 (BP Location: Right Arm)   Pulse 61   Temp 97.6 F (36.4 C) (Axillary)   Resp 18   Ht 5\' 9"  (1.753 m)   Wt 164 lb (74.4 kg)   SpO2 100%   BMI 24.22 kg/m  Body mass index is 24.22 kg/m. General: pleasant, WD, WN white male who is laying in bed in NAD HEENT: head is normocephalic, atraumatic.  Sclera are  noninjected.  PERRL.  Ears and nose without any masses or lesions.  Mouth is pink and moist Heart: regular, rate, and rhythm.  Normal s1,s2. No obvious murmurs, gallops, or rubs noted.  Palpable radial pulses bilaterally Lungs: CTAB, no wheezes, rhonchi, or rales noted.  Respiratory effort nonlabored Abd: soft, NT, ND, +BS, no masses, hernias, or organomegaly Psych: A&Ox3 with an appropriate affect.   Labs: Results for orders placed or performed during the hospital encounter of 11/27/17 (from the past 48 hour(s))  Glucose, capillary     Status: Abnormal   Collection Time: 11/27/17 11:08 AM  Result Value Ref Range   Glucose-Capillary 110 (H) 65 - 99 mg/dL  CBC upon arrival     Status: Abnormal   Collection Time: 11/27/17 11:38 AM    Result Value Ref Range   WBC 17.4 (H) 4.0 - 10.5 K/uL   RBC 4.54 4.22 - 5.81 MIL/uL   Hemoglobin 11.4 (L) 13.0 - 17.0 g/dL   HCT 36.8 (L) 39.0 - 52.0 %   MCV 81.1 78.0 - 100.0 fL   MCH 25.1 (L) 26.0 - 34.0 pg   MCHC 31.0 30.0 - 36.0 g/dL   RDW 15.0 11.5 - 15.5 %   Platelets 240 150 - 400 K/uL    Comment: Performed at [facility], [place] [address] [place], [place] [address]    Imaging: No results found.  Assessment/Plan 1. Liver lesion  Plan to proceed today with a liver lesion biopsy.  Labs and vitals have been reviewed.  WBC is elevated, but down from 26K a week ago.  Given he has no infectious symptoms, this may likely be secondary to his malignancy.  This should not prevent his biopsy from being done at this time.  Risks and benefits discussed with the patient including, but not limited to bleeding, infection, damage to adjacent structures or low yield requiring additional tests.  All of the patient's questions were answered, patient is agreeable to proceed. Consent signed and in chart.     Thank you for this interesting consult.  I greatly enjoyed meeting Daniel Burnett and look forward to participating in their care.  A copy of this report was sent to the requesting provider on this date.  Electronically Signed: Henreitta Cea 11/27/2017, 12:22 PM   I spent a total of  30 Minutes  in face to face in clinical consultation, greater than 50% of which was counseling/coordinating care for liver lesion

## 2017-11-27 NOTE — Procedures (Signed)
US guided liver lesion biopsy.  3 cores from right hepatic lesion.  Minimal blood loss and no immediate complication.

## 2017-11-27 NOTE — Discharge Instructions (Addendum)
Liver Biopsy °The liver is a large organ in the upper right-hand side of your abdomen. A liver biopsy is a procedure in which a tissue sample is taken from the liver and examined under a microscope. The procedure is done to confirm a suspected problem. °There are three types of liver biopsies: °· Percutaneous. In this type, an incision is made in your abdomen. The sample is removed through the incision with a needle. °· Laparoscopic. In this type, several incisions are made in the abdomen. A tiny camera is passed through one of the incisions to help guide the health care provider. The sample is removed through the other incision or incisions. °· Transjugular. In this type, an incision is made in the neck. A tube is passed through the incision to the liver. The sample is removed through the tube with a needle. ° °Tell a health care provider about: °· Any allergies you have. °· All medicines you are taking, including vitamins, herbs, eye drops, creams, and over-the-counter medicines. °· Any problems you or family members have had with anesthetic medicines. °· Any blood disorders you have. °· Any surgeries you have had. °· Any medical conditions you have. °· Possibility of pregnancy, if this applies. °What are the risks? °Generally, this is a safe procedure. However, problems can occur and include: °· Bleeding. °· Infection. °· Bruising. °· Collapsed lung. °· Leak of digestive juices (bile) from the liver or gallbladder. °· Problems with heart rhythm. °· Pain at the biopsy site or in the right shoulder. °· Low blood pressure (hypotension). °· Injury to nearby organs or tissues. ° °What happens before the procedure? °· Your health care provider may do some blood or urine tests. These will help your health care provider learn how well your kidneys and liver are working and how well your blood clots. °· Ask your health care provider if you will be able to go home the day of the procedure. Arrange for someone to take you  home and stay with you for at least 24 hours. °· Do not eat or drink anything after midnight on the night before the procedure or as directed by your health care provider. °· Ask your health care provider about: °? Changing or stopping your regular medicines. This is especially important if you are taking diabetes medicines or blood thinners. °? Taking medicines such as aspirin and ibuprofen. These medicines can thin your blood. Do not take these medicines before your procedure if your health care provider asks you not to. °What happens during the procedure? °Regardless of the type of biopsy that will be done, you will have an IV line placed. Through this line, you will receive fluids and medicine to relax you. If you will be having a laparoscopic biopsy, you may also receive medicine through this line to make you sleep during the procedure (general anesthetic). °Percutaneous Liver Biopsy °· You will positioned on your back, with your right hand over your head. °· A health care provider will locate your liver by tapping and pressing on the right side of your abdomen or with the help of an ultrasound machine or CT scan. °· An area at the bottom of your last right rib will be numbed. °· An incision will be made in the numbed area. °· The biopsy needle will be inserted into the incision. °· Several samples of liver tissue will be taken with the biopsy needle. You will be asked to hold your breath as each sample is taken. °Laparoscopic Liver   Biopsy °· You will be positioned on your back. °· Several small incisions will be made in your abdomen. °· Your doctor will pass a tiny camera through one incision. The camera will allow the liver to be viewed on a TV monitor in the operating room. °· Tools will be passed through the other incision or incisions. These tools will be used to remove samples of liver tissue. °Transjugular Liver Biopsy °· You will be positioned on your back on an X-ray table, with your head turned to  your left. °· An area on your neck just over your jugular vein will be numbed. °· An incision will be made in the numbed area. °· A tiny tube will be inserted through the incision. It will be pushed through the jugular vein to a blood vessel in the liver called the hepatic vein. °· Dye will be inserted through the tube, and X-rays will be taken. The dye will make the blood vessels in the liver light up on the X-rays. °· The biopsy needle will be pushed through the tube until it reaches the liver. °· Samples of liver tissue will be taken with the biopsy needle. °· The needle and the tube will be removed. °After the samples are obtained, the incision or incisions will be closed. °What happens after the procedure? °· You will be taken to a recovery area. °· You may have to lie on your right side for 1-2 hours. This will prevent bleeding from the biopsy site. °· Your progress will be watched. Your blood pressure, pulse, and the biopsy site will be checked often. °· You may have some pain or feel sick. If this happens, tell your health care provider. °· As you begin to feel better, you will be offered ice and beverages. °· You may be allowed to go home when the medicines have worn off and you can walk, drink, eat, and use the bathroom. °This information is not intended to replace advice given to you by your health care provider. Make sure you discuss any questions you have with your health care provider. °Document Released: 12/15/2003 Document Revised: 02/27/2016 Document Reviewed: 11/20/2013 °[employer] Interactive Patient Education © 2018 [employer] °Moderate Conscious Sedation, Adult, Care After °These instructions provide you with information about caring for yourself after your procedure. Your health care provider may also give you more specific instructions. Your treatment has been planned according to current medical practices, but problems sometimes occur. Call your health care provider if you have any problems  or questions after your procedure. °What can I expect after the procedure? °After your procedure, it is common: °· To feel sleepy for several hours. °· To feel clumsy and have poor balance for several hours. °· To have poor judgment for several hours. °· To vomit if you eat too soon. ° °Follow these instructions at home: °For at least 24 hours after the procedure: ° °· Do not: °? Participate in activities where you could fall or become injured. °? Drive. °? Use heavy machinery. °? Drink alcohol. °? Take sleeping pills or medicines that cause drowsiness. °? Make important decisions or sign legal documents. °? Take care of children on your own. °· Rest. °Eating and drinking °· Follow the diet recommended by your health care provider. °· If you vomit: °? Drink water, juice, or soup when you can drink without vomiting. °? Make sure you have little or no nausea before eating solid foods. °General instructions °· Have a responsible adult stay with you until   you are awake and alert. °· Take over-the-counter and prescription medicines only as told by your health care provider. °· If you smoke, do not smoke without supervision. °· Keep all follow-up visits as told by your health care provider. This is important. °Contact a health care provider if: °· You keep feeling nauseous or you keep vomiting. °· You feel light-headed. °· You develop a rash. °· You have a fever. °Get help right away if: °· You have trouble breathing. °This information is not intended to replace advice given to you by your health care provider. Make sure you discuss any questions you have with your health care provider. °Document Released: 07/15/2013 Document Revised: 02/27/2016 Document Reviewed: 01/14/2016 °[employer] Interactive Patient Education © 2018 [employer] ° °

## 2017-11-28 ENCOUNTER — Other Ambulatory Visit: Payer: PPO

## 2017-11-28 ENCOUNTER — Encounter: Payer: PPO | Admitting: Nutrition

## 2017-11-28 ENCOUNTER — Ambulatory Visit: Payer: PPO | Admitting: Hematology

## 2017-11-28 NOTE — Progress Notes (Signed)
Daniel Burnett  Telephone:([phone]) [phone] Fax:([phone]) [phone]  Clinic Follow Up Note   Patient Care Team: Unk Pinto, MD as PCP - General (Internal Medicine) Melissa Noon, Maurice as Referring Physician (Optometry) Teena Irani, MD (Inactive) as Consulting Physician (Gastroenterology) Truitt Merle, MD as Consulting Physician (Hematology) 11/29/2017  CHIEF COMPLAINTS:  Metastatic pancreatic cancer  Oncology History   Cancer Staging Pancreatic cancer [facility]) Staging form: Exocrine Pancreas, AJCC 8th Edition - Clinical stage from 11/27/2017: Stage IV (cT3, cNX, pM1) - Signed by Truitt Merle, MD on 11/29/2017       Pancreatic carcinoma metastatic to liver ([place])   11/08/2017 Imaging    CT AP W Contrast 11/08/17 IMPRESSION: 1. 6.4 centimeter low-attenuation pancreatic mass suspicious for neoplasm. Although focal pancreatitis/pseudocysts are considerations, given the lack of secondary signs of pancreatitis, malignancy is favored. Recommend further evaluation with MRI. 2. Numerous liver lesions suspicious for metastatic disease. 3. Splenomegaly. 4. Indeterminate left renal mass measuring 1.8 centimeters, warranting further characterization. Recommend further evaluation with renal protocol MRI. 5.  Aortic atherosclerosis.  (ICD10-I70.0) 6. Prostatic enlargement. 7. Bilateral fat containing inguinal hernias. 8. Coronary artery atherosclerosis.       11/15/2017 Imaging    MRI Abdomen W WO Contrast 11/15/17 IMPRESSION: 1. 4.2 x 3.0 cm cystic appearing pancreatic body mass likely reflecting pancreatic cancer with associated extensive hepatic metastatic disease. 2. 2.0 x 1.7 cm partially cystic but enhancing left renal lesion consistent with small renal neoplasm. 3. No abdominal lymphadenopathy.      11/19/2017 Initial Diagnosis    Pancreatic carcinoma metastatic to liver ([place])      11/22/2017 Imaging    CT Chest WO Contrast 11/22/17 IMPRESSION: 1. No evidence of metastatic  disease in the chest. 2. Pancreatic and liver masses are better evaluated on 11/15/2017. 3. Aortic atherosclerosis (ICD10-170.0). Coronary artery calcification. 4. Tiny left renal stone.      11/27/2017 Initial Biopsy           HISTORY OF PRESENTING ILLNESS:  Daniel Burnett 72 y.o. male is here because of his recent abnormal findings.  He was referred by his primary care physician. He presents to my clinic with his wife today.  He complains intermittent epigastric pain and mid back pian for the past 3 months, especially after eating, and he feels gasy. Mild nausea, no vomiting. He has constipation also, but it's chronic, no melana or blood in stool.  He has lost appetite and lost about 15 lbs in the past 2 weeks. He has moderate fatigue, able to do ADLs and light activities, but less active lately.  He is retired, but still works for his [employer], but had to stop working 1 week ago due to fatigue.  He denies any fever, chills.   Was seen by his primary care physician but Dr. Melford Aase.  CT abdomen and pelvis was done on November 08, 2017, which showed a 6.4 cm low-attenuation pancreatic body mass and numerous liver lesions suspicious for metastatic disease.  There is a 1.8 cm indeterminate left renal mass.  Abdominal MRI with and without contrast was subsequently obtained on November 16, 2017, and the finding was similar to CT.  He has not had a tissue biopsy yet.  CURRENT THERAPY:  INTERVAL HISTORY:  Daniel Burnett returns for follow up after his biopsy. He presents to the clinic today with his wife. He reports right sided abdominal pain onset yesterday that is worse with movement. Pt had a biopsy 2 days ago in the same  area. He took tramadol to help with this. He reports less nausea after taking compazine. He states his sugars are adequate, he had to increase his DM medication due to prednisone for a sprained ankle. This is resolved now. He does that 81 mg ASA daily.   On review of  systems, pt denies fever, chills, leg swelling or any other complaints at this time. Pertinent positives are listed and detailed within the above HPI.   MEDICAL HISTORY:  Past Medical History:  Diagnosis Date  . BPH (benign prostatic hyperplasia)   . CAD (coronary artery disease)    a. Cath 09/2015: 100% stenosis Prox LAD --> 2 DES placed, 90% D2, 99% 2nd Mrg --> DES placed, 70% RCA --> Medically manage  . Colitis   . Diabetes mellitus without complication ([place])   . ED (erectile dysfunction)   . GERD (gastroesophageal reflux disease)   . Hyperlipidemia   . Hypertension   . Kidney stone   . Vitamin D deficiency     SURGICAL HISTORY: Past Surgical History:  Procedure Laterality Date  . CARDIAC CATHETERIZATION N/A 09/27/2015   Procedure: Left Heart Cath and Coronary Angiography;  Surgeon: Leonie Man, MD;  Location: [facility];  Service: Cardiovascular;  Laterality: N/A;  . CARDIAC CATHETERIZATION N/A 09/27/2015   Procedure: Coronary Stent Intervention;  Surgeon: Leonie Man, MD;  Location: [facility];  Service: Cardiovascular;  Laterality: N/A;  Lad/OM2  . CYSTOSCOPY W/ RETROGRADES  1991   stone extractions  . [place]   negative  . SQUAMOUS CELL CARCINOMA EXCISION  2001   bridge of nose    SOCIAL HISTORY: Social History   Socioeconomic History  . Marital status: Married    Spouse name: Not on file  . Number of children: Not on file  . Years of education: Not on file  . Highest education level: Not on file  Social Needs  . Financial resource strain: Not on file  . Food insecurity - worry: Not on file  . Food insecurity - inability: Not on file  . Transportation needs - medical: Not on file  . Transportation needs - non-medical: Not on file  Occupational History  . Not on file  Tobacco Use  . Smoking status: Former Smoker    Packs/day: 0.25    Years: 5.00    Pack years: 1.25    Last attempt to quit: 10/08/1962    Years  since quitting: 55.1  . Smokeless tobacco: Never Used  Substance and Sexual Activity  . Alcohol use: No  . Drug use: No  . Sexual activity: Not on file  Other Topics Concern  . Not on file  Social History Narrative  . Not on file    FAMILY HISTORY: Family History  Problem Relation Age of Onset  . Heart disease Mother   . Heart attack Mother   . Heart disease Father   . Heart attack Father   . Heart disease Brother   . Stroke Brother   . Hypertension Brother   . Diabetes Maternal Aunt   . Cancer Cousin 45       breast cancer   . Leukemia Cousin     ALLERGIES:  is allergic to ppd [tuberculin purified protein derivative] and tricor [fenofibrate].  MEDICATIONS:  Current Outpatient Medications  Medication Sig Dispense Refill  . aspirin 81 MG tablet Take 81 mg by mouth daily.    Marland Kitchen atenolol (TENORMIN) 25 MG tablet TAKE 1/2 TO  1 TABLET BY MOUTH DAILY AS DIRECTED (Patient taking differently: Take 12.5 mg by mouth daily) 90 tablet 1  . benazepril (LOTENSIN) 40 MG tablet TAKE 1 TABLET BY MOUTH EVERY DAY FOR BLOOD PRESSURE (Patient taking differently: Take 20 mg by mouth daily) 90 tablet 3  . Cholecalciferol (VITAMIN D) 2000 UNITS CAPS Take 2,000 Units by mouth 2 (two) times daily.     Marland Kitchen esomeprazole (NEXIUM) 40 MG capsule Take 1 capsule (40 mg total) by mouth daily. 30 capsule 1  . glipiZIDE (GLUCOTROL) 5 MG tablet TAKE 1/2 TO 1 TABLET BY MOUTH TWICE DAILY WITH MEALS (Patient taking differently: Take 10 mg by mouth twice daily) 180 tablet 1  . magnesium gluconate (MAGONATE) 500 MG tablet Take 500 mg by mouth daily.    . metFORMIN (GLUCOPHAGE XR) 500 MG 24 hr tablet Take 2 tablets 2 x / day for Diabetes (Patient taking differently: Take 1,000 mg by mouth 2 (two) times daily. ) 360 tablet 1  . nitroGLYCERIN (NITROSTAT) 0.4 MG SL tablet Place 1 tablet (0.4 mg total) under the tongue every 5 (five) minutes x 3 doses as needed for chest pain. 25 tablet 2  . Omega-3 Fatty Acids (FISH OIL PO)  Take 1 capsule by mouth 2 (two) times daily.     . ONE TOUCH ULTRA TEST test strip USE ONCE A DAY AS DIRECTED (Patient not taking: Reported on 11/22/2017) 100 each 5  . pantoprazole (PROTONIX) 40 MG tablet Take 40 mg by mouth daily.    . prochlorperazine (COMPAZINE) 10 MG tablet Take 1 tablet (10 mg total) by mouth every 6 (six) hours as needed for nausea or vomiting. 30 tablet 1  . rosuvastatin (CRESTOR) 20 MG tablet Take 0.5 tablets (10 mg total) by mouth daily. 90 tablet 1  . traMADol (ULTRAM) 50 MG tablet Take 1 tablet every 4 hours if needed for pain (Patient taking differently: Take 50 mg by mouth every 4 (four) hours as needed for moderate pain. ) 30 tablet 0   No current facility-administered medications for this visit.     REVIEW OF SYSTEMS:   Constitutional: Denies fevers, chills or abnormal night sweats, (+) fatigue and a 15 pounds weight loss in the past 2 weeks. Eyes: Denies blurriness of vision, double vision or watery eyes Ears, nose, mouth, throat, and face: Denies mucositis or sore throat Respiratory: Denies cough, dyspnea or wheezes Cardiovascular: Denies palpitation, chest discomfort or lower extremity swelling Gastrointestinal:  (+) nausea, improved (+) right-sided abdominal pain Skin: Denies abnormal skin rashes Lymphatics: Denies new lymphadenopathy or easy bruising Neurological:Denies numbness, tingling or new weaknesses Behavioral/Psych: Mood is stable, no new changes  All other systems were reviewed with the patient and are negative.  PHYSICAL EXAMINATION: ECOG PERFORMANCE STATUS: 2 - Symptomatic, <50% confined to bed  Vitals:   11/29/17 1428  BP: (!) 125/56  Pulse: 68  Resp: 17  Temp: 97.7 F (36.5 C)  SpO2: 99%   Filed Weights   11/29/17 1428  Weight: 162 lb 8 oz (73.7 kg)    GENERAL:alert, no distress and comfortable SKIN: skin color, texture, turgor are normal, no rashes or significant lesions EYES: normal, conjunctiva are pink and non-injected,  sclera clear OROPHARYNX:no exudate, no erythema and lips, buccal mucosa, and tongue normal  NECK: supple, thyroid normal size, non-tender, without nodularity LYMPH:  no palpable lymphadenopathy in the cervical, axillary or inguinal LUNGS: clear to auscultation and percussion with normal breathing effort HEART: regular rate & rhythm and no murmurs and  no lower extremity edema ABDOMEN:abdomen soft, moderate tenderness in the epigastric area, no hepatomegaly or palpable mass Musculoskeletal:no cyanosis of digits and no clubbing  PSYCH: alert & oriented x 3 with fluent speech NEURO: no focal motor/sensory deficits  LABORATORY DATA:  I have reviewed the data as listed CBC Latest Ref Rng & Units 11/27/2017 11/19/2017 11/05/2017  WBC 4.0 - 10.5 K/uL 17.4(H) 26.4(H) 15.0(H)  Hemoglobin 13.0 - 17.0 g/dL 11.4(L) - 11.3(L)  Hematocrit 39.0 - 52.0 % 36.8(L) 40.5 34.6(L)  Platelets 150 - 400 K/uL 240 341 343   CMP Latest Ref Rng & Units 11/19/2017 11/05/2017 08/05/2017  Glucose 70 - 140 mg/dL 204(H) 105(H) 109(H)  BUN 7 - 26 mg/dL 27(H) 19 21  Creatinine 0.70 - 1.30 mg/dL 0.87 0.78 1.00  Sodium 136 - 145 mmol/L 132(L) 136 138  Potassium 3.5 - 5.1 mmol/L 4.5 4.5 4.8  Chloride 98 - 109 mmol/L 92(L) 99 97(L)  CO2 22 - 29 mmol/L 28 28 31   Calcium 8.4 - 10.4 mg/dL 9.9 9.6 10.0  Total Protein 6.4 - 8.3 g/dL 6.4 6.3 7.0  Total Bilirubin 0.2 - 1.2 mg/dL 0.6 0.5 0.7  Alkaline Phos 40 - 150 U/L 193(H) - -  AST 5 - 34 U/L 17 18 13   ALT 0 - 55 U/L 38 34 15    RADIOGRAPHIC STUDIES: I have personally reviewed the radiological images as listed and agreed with the findings in the report. Dg Eye Foreign Body  Result Date: 11/11/2017 CLINICAL DATA:  Metal working/exposure; clearance prior to MRI EXAM: ORBITS FOR FOREIGN BODY - 2 VIEW COMPARISON:  None. FINDINGS: There is no evidence of metallic foreign body within the orbits. No significant bone abnormality identified. IMPRESSION: No evidence of metallic foreign  body within the orbits. Electronically Signed   By: Kristine Garbe M.D.   On: 11/11/2017 15:40   Ct Chest Wo Contrast  Result Date: 11/25/2017 CLINICAL DATA:  Pancreatic cancer. EXAM: CT CHEST WITHOUT CONTRAST TECHNIQUE: Multidetector CT imaging of the chest was performed following the standard protocol without IV contrast. COMPARISON:  MR abdomen 11/15/2017 and CT abdomen pelvis 11/08/2017. FINDINGS: Cardiovascular: Atherosclerotic calcification of the arterial vasculature, including extensive three-vessel involvement of the coronary arteries. Heart is at the upper limits of normal in size. No pericardial effusion. Mediastinum/Nodes: No pathologically enlarged mediastinal or axillary lymph nodes. Hilar regions are difficult to definitively evaluate without IV contrast. Esophagus is grossly unremarkable. Lungs/Pleura: Scarring in the lower lobes, left greater than right. Lungs are otherwise clear. No pleural fluid. Airway is unremarkable. Upper Abdomen: Heterogeneous masses in the liver measure up to approximately 8.7 x 10.2 cm, better evaluated on 12/05/2017. Visualized portions of the gallbladder, adrenal glands and right kidney are unremarkable. Tiny stone in the left kidney. Spleen is unremarkable. Pancreatic body/tail mass, better seen and measured on comparison exams. Visualized portions of the stomach and bowel are grossly unremarkable. Left hemidiaphragm is elevated. Musculoskeletal: Degenerative changes in the spine. No worrisome lytic or sclerotic lesions. IMPRESSION: 1. No evidence of metastatic disease in the chest. 2. Pancreatic and liver masses are better evaluated on 11/15/2017. 3. Aortic atherosclerosis (ICD10-170.0). Coronary artery calcification. 4. Tiny left renal stone. Electronically Signed   By: Lorin Picket M.D.   On: 11/25/2017 08:34   Mr Abdomen W Wo Contrast  Result Date: 11/16/2017 CLINICAL DATA:  Evaluate pancreatic mass and liver lesions seen on recent CT scan. EXAM:  MRI ABDOMEN WITHOUT AND WITH CONTRAST TECHNIQUE: Multiplanar multisequence MR imaging of the abdomen was  performed both before and after the administration of intravenous contrast. CONTRAST:  100m MULTIHANCE GADOBENATE DIMEGLUMINE 529 MG/ML IV SOLN COMPARISON:  CT scan 11/08/2017 FINDINGS: Lower chest: No obvious lung lesions. No pleural or pericardial effusion. Hepatobiliary: Numerous large liver lesions as demonstrated on the CT scan consistent with liver metastasis. Large confluent mass at the hepatic dome measures 9.8 x 8.0 x 8.2 cm. Slightly more inferior and lateral lesion measures 2.8 x 2.4 cm. 2.5 x 1.7 cm lesion in the caudate lobe. There are also 2 smaller segment 6 lesions. Pancreas: As demonstrated on the CT scan there is a large necrotic appearing lesion in the pancreatic body measuring 4.2 x 3.0 cm. This is likely the primary neoplasm and associated hepatic metastatic disease. There is pancreatic ductal dilatation proximal to the lesion. The pancreatic head is normal. No common bile duct dilatation. Spleen:  Mildly enlarged measuring 16.3 x 9.0 x 7.4 cm. No focal lesions. Adrenals/Urinary Tract: The adrenal glands and kidneys are unremarkable. There is a 2.0 x 1.7 cm partially cystic lesion projecting off the midpole lower pole junction region of the left kidney posteriorly. The non-cystic portion demonstrates contrast enhancement and is worrisome for a small renal neoplasm. The right kidney is unremarkable. Stomach/Bowel: Visualized portions within the abdomen are unremarkable. Vascular/Lymphatic: Advanced atherosclerotic changes involving the aorta. The branch vessels are patent. The major venous structures are patent. No enlarged mesenteric or retroperitoneal lymph nodes are identified. Other:  No ascites or abdominal wall hernia. Musculoskeletal: No findings suspicious for hepatic metastatic disease. IMPRESSION: 1. 4.2 x 3.0 cm cystic appearing pancreatic body mass likely reflecting pancreatic  cancer with associated extensive hepatic metastatic disease. 2. 2.0 x 1.7 cm partially cystic but enhancing left renal lesion consistent with small renal neoplasm. 3. No abdominal lymphadenopathy. Electronically Signed   By: PMarijo SanesM.D.   On: 11/16/2017 09:32   Ct Abdomen Pelvis W Contrast  Result Date: 11/08/2017 CLINICAL DATA:  Abdominal pain after eating. Weight loss. Constipation, nausea for 3 months. EXAM: CT ABDOMEN AND PELVIS WITH CONTRAST TECHNIQUE: Multidetector CT imaging of the abdomen and pelvis was performed using the standard protocol following bolus administration of intravenous contrast. CONTRAST:  1031mISOVUE-300 IOPAMIDOL (ISOVUE-300) INJECTION 61% COMPARISON:  Chest x-ray 09/27/2015 FINDINGS: Lower chest: There is minimal right lower lobe atelectasis. No suspicious pulmonary nodules are identified at the bases. There is significant coronary atherosclerotic calcification and/or stents. Hepatobiliary: Numerous liver lesions are present. Multiple confluent lesions at the dome of the right hepatic lobe together measures 7.5 x 10.2 centimeters. A small lesion in the right hepatic lobe is 2.7 centimeters. Left hepatic lobe lesion is 2.8 centimeters. The gallbladder is present. Lesions show indistinct peripheral margins and lack characteristics of benign liver lesions, suspicious for metastatic disease. Pancreas: There is a low-attenuation lesion involving the pancreatic body measuring 3.3 x 6.4 x 3.5 centimeters. No peripancreatic stranding or other evidence for acute pancreatitis. No pancreatic calcifications indicate chronic pancreatitis. Spleen: The spleen is enlarged, without focal lesion. Adrenals/Urinary Tract: Adrenal glands are normal in appearance. Within the midpole region of the left kidney, there is a cystic mass containing at least a single septation measuring 1.8 x 1.5 centimeters an indeterminate by CT. Suspect nonobstructing intrarenal calculus in the lower pole the right  kidney, 3 millimeters in diameter. Suspect nonobstructing calculus in the lower pole the left kidney, measuring 1.4 centimeters. Alternatively, this could represent early excretion of contrast on this contrast-enhanced exam. No evidence for urinary tract obstruction. The urinary bladder  is unremarkable in appearance. Stomach/Bowel: The stomach and small bowel loops are normal in appearance. There are numerous sigmoid diverticula. No acute diverticulitis. Vascular/Lymphatic: There is dense atherosclerotic calcification of the abdominal aorta not associated with aneurysm. Small portacaval lymph nodes are identified, measuring less than 1 centimeter in short axis. Reproductive: The prostate is enlarged. Fat containing bilateral inguinal hernias. No free pelvic fluid. Other: None Musculoskeletal: No suspicious lytic or blastic lesions are identified. There are moderate degenerative changes throughout the lower thoracic and lumbar spine. IMPRESSION: 1. 6.4 centimeter low-attenuation pancreatic mass suspicious for neoplasm. Although focal pancreatitis/pseudocysts are considerations, given the lack of secondary signs of pancreatitis, malignancy is favored. Recommend further evaluation with MRI. 2. Numerous liver lesions suspicious for metastatic disease. 3. Splenomegaly. 4. Indeterminate left renal mass measuring 1.8 centimeters, warranting further characterization. Recommend further evaluation with renal protocol MRI. 5.  Aortic atherosclerosis.  (ICD10-I70.0) 6. Prostatic enlargement. 7. Bilateral fat containing inguinal hernias. 8. Coronary artery atherosclerosis. Electronically Signed   By: Nolon Nations M.D.   On: 11/08/2017 14:40   US Biopsy (liver)  Result Date: 11/27/2017 INDICATION: 72 year old with multiple liver lesions, pancreatic lesion and left renal lesion. Tissue diagnosis is needed. EXAM: ULTRASOUND-GUIDED LIVER LESION BIOPSY WITH ULTRASOUND GUIDANCE MEDICATIONS: None. ANESTHESIA/SEDATION: Moderate  (conscious) sedation was employed during this procedure. A total of Versed 2.0 mg and Fentanyl 100 mcg was administered intravenously. Moderate Sedation Time: 18 minutes. The patient's level of consciousness and vital signs were monitored continuously by radiology nursing throughout the procedure under my direct supervision. FLUOROSCOPY TIME:  None COMPLICATIONS: None immediate. PROCEDURE: Informed written consent was obtained from the patient after a thorough discussion of the procedural risks, benefits and alternatives. All questions were addressed. A timeout was performed prior to the initiation of the procedure. Liver was evaluated with ultrasound. Multiple right hepatic lesions were identified. A peripheral lesion in the right hepatic lobe was targeted for biopsy. Right side of the abdomen was prepped and draped in sterile fashion. Skin and soft tissues were anesthetized with 1% lidocaine. 26 gauge needle directed into the right hepatic lobe and right hepatic lesion with ultrasound guidance. Total of 3 core biopsies obtained with an 18 gauge device. Specimens placed in formalin. Gel-Foam slurry was injected through the 17 gauge needle as it was removed. Bandage placed over the puncture site. FINDINGS: Right hepatic lesions, largest at the hepatic dome. Peripheral lesion in the right hepatic lobe was targeted for biopsy. Biopsy needle was confirmed within the lesion. No evidence for bleeding or hematoma formation following the core biopsies. IMPRESSION: Successful ultrasound-guided core biopsies of a right hepatic lesion. Electronically Signed   By: Markus Daft M.D.   On: 11/27/2017 15:29   CT Chest WO Contrast 11/22/17 IMPRESSION: 1. No evidence of metastatic disease in the chest. 2. Pancreatic and liver masses are better evaluated on 11/15/2017. 3. Aortic atherosclerosis (ICD10-170.0). Coronary artery calcification. 4. Tiny left renal stone.  MRI Abdomen W WO Contrast 11/15/17 IMPRESSION: 1. 4.2 x 3.0  cm cystic appearing pancreatic body mass likely reflecting pancreatic cancer with associated extensive hepatic metastatic disease. 2. 2.0 x 1.7 cm partially cystic but enhancing left renal lesion consistent with small renal neoplasm. 3. No abdominal lymphadenopathy.  CT AP W Contrast 11/08/17 IMPRESSION: 1. 6.4 centimeter low-attenuation pancreatic mass suspicious for neoplasm. Although focal pancreatitis/pseudocysts are considerations, given the lack of secondary signs of pancreatitis, malignancy is favored. Recommend further evaluation with MRI. 2. Numerous liver lesions suspicious for metastatic disease. 3. Splenomegaly. 4. Indeterminate  left renal mass measuring 1.8 centimeters, warranting further characterization. Recommend further evaluation with renal protocol MRI. 5.  Aortic atherosclerosis.  (ICD10-I70.0) 6. Prostatic enlargement. 7. Bilateral fat containing inguinal hernias. 8. Coronary artery atherosclerosis.  ASSESSMENT & PLAN: 72 y.o. patient male, with past medical history of coronary artery disease, status post stent placement in 2016, hypertension, diabetes, CKD, presented with epigastric pain, fatigue, low appetite and weight loss  1.  Pancreatic cancer with liver metastasis ([facility]) Stage IV (cT3, cNX, pM1) -I have previously reviewed his abdominal CT and MRI images and findings with patient and he his wife in person. This is highly suspicious for metastatic pancreatic cancer.  -He underwent liver biopsy on November 27, 2017, I spoke with pathologist Dr. Barry Dienes today, biopsy is positive for metastatic carcinoma, immunostains studies are still pending.  During the scan findings, this is consistent with metastatic pancreatic cancer to liver. -Unfortunately this is incurable disease. The goal of therapy is likely palliative. -I previously discussed in brief the systemic therapy options for metastatic pancreatic cancer, including different regiment of chemotherapy, immunotherapy  if it is is MSI-H disease  -I referred him to genetics to check BRCA mutation, to see if he is a candidate for PARP inhibitor  -CT Chest WO Contrast from 11/22/17 reveals no evidence of metastatic disease in the chest. -CA 19.9 is 1 on 11/19/17. His tumor is not secreting this tumor marker so we will rely on scan to monitor his disease.  -I discussed chemotherapy options today. Due to his age and condition, I recommend two drug combination chemo with Gemcitabine and Abraxane. I discussed the side effects of these drugs and I discussed that this will help prolong his life and help with quality of life.  As a chemo regimens such as single agent gemcitabine, FOLFIRINOX, were discussed with patient also. -I discussed his prognosis with and without treatment. Pt had questions and I answered them in detail.  -I discussed doing [facility] testing to see if he is a candidate for immunotherapy. He agrees.  -I offered chemo class and he is interested. He would like some time to think about what he wants to do. He has an appointment with a nutritionist next week  -I encouraged him to eat well with more protein and be more active.  -F/u open, he will let us know of his decision and he knows he can contact us with any more questions -If he agrees, plan to start chem week of 3/4  2. Left renal lesion -Likely neoplasm, based on the MRI, metastatic disease less likely  -due to his metastatic pancreatic cancer, will watch the renal lesion, and defer treatment for now   3. CAD, DM, HTN  -f/u with PCP -we discussed that his blood glucose may get worse due to his pancreatic cancer, will monitor closely   4. Genetics  -I gave him genetic referral today, to check BRCA1/2 mutation  5. Goal of care discussion  -We again discussed the incurable nature of his cancer, and the overall poor prognosis, especially if he does not have good response to chemotherapy or progress on chemo -The patient understands the goal of  care is palliative. -I recommend DNR/DNI, she will think about it   Plan -Referral for genetics  -Nutrition and chemo class next week scheduled  -Foundation One testing -F/u open, he will let us know if he wants to start chemo. If he agrees, plan to start Gemcitabine and Abraxane week of 3/4  All questions were answered.  The patient knows to call the clinic with any problems, questions or concerns. I spent 30 minutes counseling the patient face to face. The total time spent in the appointment was 40 minutes and more than 50% was on counseling.  This document serves as a record of services personally performed by Truitt Merle, MD. It was created on her behalf by Theresia Bough, a trained medical scribe. The creation of this record is based on the scribe's personal observations and the provider's statements to them.   I have reviewed the above documentation for accuracy and completeness, and I agree with the above.     Truitt Merle, MD 11/29/2017

## 2017-11-29 ENCOUNTER — Encounter: Payer: Self-pay | Admitting: Hematology

## 2017-11-29 ENCOUNTER — Inpatient Hospital Stay (HOSPITAL_BASED_OUTPATIENT_CLINIC_OR_DEPARTMENT_OTHER): Payer: PPO | Admitting: Hematology

## 2017-11-29 VITALS — BP 125/56 | HR 68 | Temp 97.7°F | Resp 17 | Ht 69.0 in | Wt 162.5 lb

## 2017-11-29 DIAGNOSIS — N189 Chronic kidney disease, unspecified: Secondary | ICD-10-CM | POA: Diagnosis not present

## 2017-11-29 DIAGNOSIS — N2889 Other specified disorders of kidney and ureter: Secondary | ICD-10-CM

## 2017-11-29 DIAGNOSIS — C251 Malignant neoplasm of body of pancreas: Secondary | ICD-10-CM | POA: Diagnosis not present

## 2017-11-29 DIAGNOSIS — C250 Malignant neoplasm of head of pancreas: Secondary | ICD-10-CM

## 2017-11-29 DIAGNOSIS — I129 Hypertensive chronic kidney disease with stage 1 through stage 4 chronic kidney disease, or unspecified chronic kidney disease: Secondary | ICD-10-CM | POA: Diagnosis not present

## 2017-11-29 DIAGNOSIS — C787 Secondary malignant neoplasm of liver and intrahepatic bile duct: Secondary | ICD-10-CM

## 2017-11-29 DIAGNOSIS — Z7189 Other specified counseling: Secondary | ICD-10-CM | POA: Insufficient documentation

## 2017-11-29 DIAGNOSIS — E1122 Type 2 diabetes mellitus with diabetic chronic kidney disease: Secondary | ICD-10-CM | POA: Diagnosis not present

## 2017-11-29 NOTE — Progress Notes (Signed)
START ON PATHWAY REGIMEN - Pancreatic     A cycle is every 28 days:     Nab-paclitaxel (protein bound)      Gemcitabine   **Always confirm dose/schedule in your pharmacy ordering system**    Patient Characteristics: Adenocarcinoma, Metastatic Disease, First Line, PS ? 2 Histology: Adenocarcinoma Current evidence of distant metastases<= Yes AJCC T Category: T3 AJCC N Category: NX AJCC M Category: M1 AJCC 8 Stage Grouping: IV Line of Therapy: First Line Would you be surprised if this patient died  in the next year<= I would NOT be surprised if this patient died in the next year Intent of Therapy: Non-Curative / Palliative Intent, Discussed with Patient

## 2017-12-02 NOTE — Progress Notes (Signed)
  Oncology Nurse Navigator Documentation  Navigator Location: [facility] (12/02/17 1048)   )Navigator Encounter Type: Telephone (12/02/17 1048) Telephone: [place] Call (12/02/17 1048)  Called patient to discuss appointment on 11/29/17. Patient shared that he is feeling discouraged after reading about chemo on the Internet. Patient asked what would happen if he did not "take chemo." We discussed what symptoms patient might have and I educated patient on how a palliative team could help him if he chooses to not start treatment. Patient requested that his chemo education and nutrition appointment be rescheduled for the first week in March.                         Interventions: Education;Psycho-social support (12/02/17 1048)            Acuity: Level 2 (12/02/17 1048)   Acuity Level 2: Ongoing guidance and education throughout treatment as needed (12/02/17 1048)     Time Spent with Patient: 30 (12/02/17 1048)

## 2017-12-03 ENCOUNTER — Encounter: Payer: PPO | Admitting: Nutrition

## 2017-12-03 ENCOUNTER — Other Ambulatory Visit: Payer: PPO

## 2017-12-04 ENCOUNTER — Telehealth: Payer: Self-pay

## 2017-12-04 ENCOUNTER — Other Ambulatory Visit: Payer: Self-pay | Admitting: Hematology

## 2017-12-04 ENCOUNTER — Encounter: Payer: Self-pay | Admitting: *Deleted

## 2017-12-04 DIAGNOSIS — C250 Malignant neoplasm of head of pancreas: Secondary | ICD-10-CM

## 2017-12-04 NOTE — Telephone Encounter (Signed)
Talked with patient concerning changes in appointment. Per 2/27 los

## 2017-12-04 NOTE — Progress Notes (Signed)
  Oncology Nurse Navigator Documentation  Navigator Location: [facility] (12/04/17 1355)   )Navigator Encounter Type: Telephone (12/04/17 1355) Telephone: [facility] Call;Appt Confirmation/Clarification (12/04/17 1355)    Called and spoke with Daniel Burnett. Patient is leaning towards not starting chemo therapy. He has agreed to go to his chemo-ed, nutrition, genetics, lab and Dr. Burr Medico appointments on 12/09/17 and will let Dr. Burr Medico know his decision at that time. I clarified patient's appointments and offered emotional-support.                       Interventions: Coordination of Care;Psycho-social support (12/04/17 1355)   Coordination of Care: Appts (12/04/17 1355)        Acuity: Level 2 (12/04/17 1355)   Acuity Level 2: Ongoing guidance and education throughout treatment as needed (12/04/17 1355)     Time Spent with Patient: 30 (12/04/17 1355)

## 2017-12-05 ENCOUNTER — Other Ambulatory Visit: Payer: PPO

## 2017-12-05 ENCOUNTER — Telehealth: Payer: Self-pay | Admitting: Hematology

## 2017-12-05 NOTE — Telephone Encounter (Signed)
12/05/17 @ 11:36 am spoke with patient's wife and she requested to leave FMLA paperwork at front desk reception area to be picked up tmrw (12/06/17)

## 2017-12-06 ENCOUNTER — Inpatient Hospital Stay: Payer: PPO

## 2017-12-06 ENCOUNTER — Inpatient Hospital Stay: Payer: PPO | Attending: Hematology | Admitting: Nutrition

## 2017-12-06 ENCOUNTER — Encounter: Payer: Self-pay | Admitting: *Deleted

## 2017-12-06 DIAGNOSIS — N289 Disorder of kidney and ureter, unspecified: Secondary | ICD-10-CM | POA: Insufficient documentation

## 2017-12-06 DIAGNOSIS — C787 Secondary malignant neoplasm of liver and intrahepatic bile duct: Secondary | ICD-10-CM | POA: Insufficient documentation

## 2017-12-06 DIAGNOSIS — C251 Malignant neoplasm of body of pancreas: Secondary | ICD-10-CM | POA: Insufficient documentation

## 2017-12-06 DIAGNOSIS — I10 Essential (primary) hypertension: Secondary | ICD-10-CM | POA: Insufficient documentation

## 2017-12-06 DIAGNOSIS — E119 Type 2 diabetes mellitus without complications: Secondary | ICD-10-CM | POA: Insufficient documentation

## 2017-12-06 DIAGNOSIS — I2510 Atherosclerotic heart disease of native coronary artery without angina pectoris: Secondary | ICD-10-CM | POA: Insufficient documentation

## 2017-12-06 NOTE — Progress Notes (Signed)
error 

## 2017-12-06 NOTE — Progress Notes (Signed)
72 year old male diagnosed with cancer of the pancreas.  Past medical history includes vitamin D deficiency, kidney stones, hypertension, hyperlipidemia, GERD, diabetes, colitis, and CAD.  Medications include vitamin D, Nexium, Glucotrol, metformin, fish oil, Protonix, Compazine, and Crestor.  Labs include sodium 132, glucose 204, and albumin 3.1 on February 12.  Height: 5 feet 9 inches. Weight: 162.5 pounds on February 22. Usual body weight: 175 pounds in October. BMI: 24.0  Patient is still considering whether he is going to agree to chemotherapy. Patient reports he has daily nausea. He admits to weight loss and constipation. Patient is very nervous and afraid about this diagnosis.  Nutrition diagnosis:  Food and nutrition related knowledge deficit related to new diagnosis of pancreas cancer as evidenced by no prior need for nutrition related information.  Intervention: Patient was educated to consume smaller more frequent meals and snacks. Reviewed importance of high-calorie high-protein foods to achieve weight maintenance. Reviewed strategies for managing constipation. Encourage patient to take nausea medications as prescribed. Recommended patient try glucose controlled oral nutrition supplements.  Provided samples and coupons. Answered patient's questions.  Teach back method used.  Contact information given.  Fact sheets provided.  Monitoring evaluation goals: Patient will tolerate adequate calories and protein to minimize weight loss.  Next visit: To be scheduled as needed per patient.

## 2017-12-09 ENCOUNTER — Other Ambulatory Visit: Payer: PPO

## 2017-12-09 ENCOUNTER — Encounter: Payer: PPO | Admitting: Nutrition

## 2017-12-09 ENCOUNTER — Encounter: Payer: Self-pay | Admitting: Genetic Counselor

## 2017-12-09 ENCOUNTER — Inpatient Hospital Stay (HOSPITAL_BASED_OUTPATIENT_CLINIC_OR_DEPARTMENT_OTHER): Payer: PPO | Admitting: Genetic Counselor

## 2017-12-09 ENCOUNTER — Inpatient Hospital Stay (HOSPITAL_BASED_OUTPATIENT_CLINIC_OR_DEPARTMENT_OTHER): Payer: PPO | Admitting: Hematology

## 2017-12-09 ENCOUNTER — Inpatient Hospital Stay: Payer: PPO

## 2017-12-09 ENCOUNTER — Encounter: Payer: Self-pay | Admitting: Hematology

## 2017-12-09 VITALS — BP 118/72 | HR 71 | Temp 97.6°F | Resp 20 | Ht 69.0 in | Wt 163.1 lb

## 2017-12-09 DIAGNOSIS — N289 Disorder of kidney and ureter, unspecified: Secondary | ICD-10-CM

## 2017-12-09 DIAGNOSIS — E119 Type 2 diabetes mellitus without complications: Secondary | ICD-10-CM | POA: Diagnosis not present

## 2017-12-09 DIAGNOSIS — Z803 Family history of malignant neoplasm of breast: Secondary | ICD-10-CM

## 2017-12-09 DIAGNOSIS — I2510 Atherosclerotic heart disease of native coronary artery without angina pectoris: Secondary | ICD-10-CM | POA: Diagnosis not present

## 2017-12-09 DIAGNOSIS — Z7189 Other specified counseling: Secondary | ICD-10-CM

## 2017-12-09 DIAGNOSIS — C250 Malignant neoplasm of head of pancreas: Secondary | ICD-10-CM

## 2017-12-09 DIAGNOSIS — C787 Secondary malignant neoplasm of liver and intrahepatic bile duct: Secondary | ICD-10-CM | POA: Diagnosis not present

## 2017-12-09 DIAGNOSIS — C251 Malignant neoplasm of body of pancreas: Secondary | ICD-10-CM | POA: Diagnosis not present

## 2017-12-09 DIAGNOSIS — I10 Essential (primary) hypertension: Secondary | ICD-10-CM | POA: Diagnosis not present

## 2017-12-09 DIAGNOSIS — Z806 Family history of leukemia: Secondary | ICD-10-CM | POA: Diagnosis not present

## 2017-12-09 DIAGNOSIS — C259 Malignant neoplasm of pancreas, unspecified: Secondary | ICD-10-CM

## 2017-12-09 LAB — CMP (CANCER CENTER ONLY)
ALT: 55 U/L (ref 0–55)
AST: 23 U/L (ref 5–34)
Albumin: 2.4 g/dL — ABNORMAL LOW (ref 3.5–5.0)
Alkaline Phosphatase: 287 U/L — ABNORMAL HIGH (ref 40–150)
Anion gap: 14 — ABNORMAL HIGH (ref 3–11)
BILIRUBIN TOTAL: 0.4 mg/dL (ref 0.2–1.2)
BUN: 14 mg/dL (ref 7–26)
CO2: 25 mmol/L (ref 22–29)
Calcium: 10.1 mg/dL (ref 8.4–10.4)
Chloride: 94 mmol/L — ABNORMAL LOW (ref 98–109)
Creatinine: 0.76 mg/dL (ref 0.70–1.30)
GFR, Est AFR Am: >60 mL/min (ref >60)
GFR, Estimated: >60 mL/min (ref >60)
Glucose, Bld: 163 mg/dL — ABNORMAL HIGH (ref 70–140)
POTASSIUM: 4.8 mmol/L (ref 3.5–5.1)
Sodium: 133 mmol/L — ABNORMAL LOW (ref 136–145)
TOTAL PROTEIN: 6.1 g/dL — AB (ref 6.4–8.3)

## 2017-12-09 LAB — CBC WITH DIFFERENTIAL (CANCER CENTER ONLY)
Basophils Absolute: 0.1 10*3/uL (ref 0.0–0.1)
Basophils Relative: 1 %
EOS PCT: 4 %
Eosinophils Absolute: 0.6 10*3/uL — ABNORMAL HIGH (ref 0.0–0.5)
HEMATOCRIT: 34 % — AB (ref 38.4–49.9)
Hemoglobin: 10.7 g/dL — ABNORMAL LOW (ref 13.0–17.1)
Lymphocytes Relative: 9 %
Lymphs Abs: 1.5 10*3/uL (ref 0.9–3.3)
MCH: 24.4 pg — ABNORMAL LOW (ref 27.2–33.4)
MCHC: 31.4 g/dL — ABNORMAL LOW (ref 32.0–36.0)
MCV: 77.7 fL — ABNORMAL LOW (ref 79.3–98.0)
MONO ABS: 2.1 10*3/uL — AB (ref 0.1–0.9)
Monocytes Relative: 12 %
NEUTROS ABS: 13.3 10*3/uL — AB (ref 1.5–6.5)
Neutrophils Relative %: 74 %
PLATELETS: 354 10*3/uL (ref 140–400)
RBC: 4.37 MIL/uL (ref 4.20–5.82)
RDW: 16 % — AB (ref 11.0–14.6)
Smear Review: 2
WBC: 17.7 10*3/uL — AB (ref 4.0–10.3)

## 2017-12-09 MED ORDER — MIRTAZAPINE 7.5 MG PO TABS: 7.5000 mg | ORAL_TABLET | Freq: Every day | ORAL | 1 refills | Status: DC

## 2017-12-09 MED ORDER — HYDROCODONE-ACETAMINOPHEN 5-325 MG PO TABS: 1.0000 | ORAL_TABLET | Freq: Four times a day (QID) | ORAL | 0 refills | Status: AC | PRN

## 2017-12-09 NOTE — Progress Notes (Signed)
[facility]  [phone] [phone]  Clinic Follow Up Note   Patient Care Team: Unk Pinto, MD as PCP - General (Internal Medicine) Melissa Noon, [place] as Referring Physician (Optometry) Teena Irani, MD (Inactive) as Consulting Physician (Gastroenterology) Truitt Merle, MD as Consulting Physician (Hematology)   Date of Service:  12/09/2017  CHIEF COMPLAINTS:  Follow up of Metastatic pancreatic cancer  Oncology History   Cancer Staging Pancreatic cancer [facility]) Staging form: Exocrine Pancreas, AJCC 8th Edition - Clinical stage from 11/27/2017: Stage IV (cT3, cNX, pM1) - Signed by Truitt Merle, MD on 11/29/2017       Pancreatic carcinoma metastatic to liver ([place])   11/08/2017 Imaging    CT AP W Contrast 11/08/17 IMPRESSION: 1. 6.4 centimeter low-attenuation pancreatic mass suspicious for neoplasm. Although focal pancreatitis/pseudocysts are considerations, given the lack of secondary signs of pancreatitis, malignancy is favored. Recommend further evaluation with MRI. 2. Numerous liver lesions suspicious for metastatic disease. 3. Splenomegaly. 4. Indeterminate left renal mass measuring 1.8 centimeters, warranting further characterization. Recommend further evaluation with renal protocol MRI. 5.  Aortic atherosclerosis.  (ICD10-I70.0) 6. Prostatic enlargement. 7. Bilateral fat containing inguinal hernias. 8. Coronary artery atherosclerosis.       11/15/2017 Imaging    MRI Abdomen W WO Contrast 11/15/17 IMPRESSION: 1. 4.2 x 3.0 cm cystic appearing pancreatic body mass likely reflecting pancreatic cancer with associated extensive hepatic metastatic disease. 2. 2.0 x 1.7 cm partially cystic but enhancing left renal lesion consistent with small renal neoplasm. 3. No abdominal lymphadenopathy.      11/19/2017 Initial Diagnosis    Pancreatic carcinoma metastatic to liver ([place])      11/22/2017 Imaging    CT Chest WO Contrast  11/22/17 IMPRESSION: 1. No evidence of metastatic disease in the chest. 2. Pancreatic and liver masses are better evaluated on 11/15/2017. 3. Aortic atherosclerosis (ICD10-170.0). Coronary artery calcification. 4. Tiny left renal stone.      11/27/2017 Initial Biopsy           HISTORY OF PRESENTING ILLNESS:  Daniel Burnett 72 y.o. male is here because of his recent abnormal findings.  He was referred by his primary care physician. He presents to my clinic with his wife today.  He complains intermittent epigastric pain and mid back pian for the past 3 months, especially after eating, and he feels gassy. Mild nausea, no vomiting. He has constipation also, but it's chronic, no melana or blood in stool.  He has lost appetite and lost about 15 lbs in the past 2 weeks. He has moderate fatigue, able to do ADLs and light activities, but less active lately.  He is retired, but still works for his [employer], but had to stop working 1 week ago due to fatigue.  He denies any fever, chills.   Was seen by his primary care physician but Dr. Melford Aase.  CT abdomen and pelvis was done on November 08, 2017, which showed a 6.4 cm low-attenuation pancreatic body mass and numerous liver lesions suspicious for metastatic disease.  There is a 1.8 cm indeterminate left renal mass.  Abdominal MRI with and without contrast was subsequently obtained on November 16, 2017, and the finding was similar to CT.  He has not had a tissue biopsy yet.  CURRENT THERAPY: Supportive Care  INTERVAL HISTORY: [name] returns for follow up and discuss starting chemotherapy. He presents to the clinic today with his wife. He notes he feels more tired. GI Navigator Dawn was present in  visit. He is able to do some things at home but not as much as usually. His wife notes he does not eat much. He gets nauseous when he eats and does not take his antiemetics. He notes his appetite has changed. He drinks ensure boosts 1-1.5 a day. Him  and his wife were emotional about how to proceed with his cancer. He does not want to be sick for most of the time he has left.    On review of symptoms, pt notes being tired and has abdominal pain that comes and goes. He takes Tramadol which helps him at night. This helps him sleep through the night.      MEDICAL HISTORY:  Past Medical History:  Diagnosis Date  . BPH (benign prostatic hyperplasia)   . CAD (coronary artery disease)    a. Cath 09/2015: 100% stenosis Prox LAD --> 2 DES placed, 90% D2, 99% 2nd Mrg --> DES placed, 70% RCA --> Medically manage  . Colitis   . Diabetes mellitus without complication ([place])   . ED (erectile dysfunction)   . Family history of breast cancer   . Family history of leukemia   . GERD (gastroesophageal reflux disease)   . Hyperlipidemia   . Hypertension   . Kidney stone   . Vitamin D deficiency     SURGICAL HISTORY: Past Surgical History:  Procedure Laterality Date  . CARDIAC CATHETERIZATION N/A 09/27/2015   Procedure: Left Heart Cath and Coronary Angiography;  Surgeon: Leonie Man, MD;  Location: [place] [facility];  Service: Cardiovascular;  Laterality: N/A;  . CARDIAC CATHETERIZATION N/A 09/27/2015   Procedure: Coronary Stent Intervention;  Surgeon: Leonie Man, MD;  Location: [place] [facility];  Service: Cardiovascular;  Laterality: N/A;  Lad/OM2  . CYSTOSCOPY W/ RETROGRADES  1991   stone extractions  . Q[place]   negative  . SQUAMOUS CELL CARCINOMA EXCISION  2001   bridge of nose    SOCIAL HISTORY: Social History   Socioeconomic History  . Marital status: Married    Spouse name: Not on file  . Number of children: 2  . Years of education: Not on file  . Highest education level: Not on file  Social Needs  . Financial resource strain: Not on file  . Food insecurity - worry: Not on file  . Food insecurity - inability: Not on file  . Transportation needs - medical: Not on file  . Transportation needs  - non-medical: Not on file  Occupational History  . Not on file  Tobacco Use  . Smoking status: Former Smoker    Packs/day: 0.25    Years: 5.00    Pack years: 1.25    Last attempt to quit: 10/08/1962    Years since quitting: 55.2  . Smokeless tobacco: Never Used  Substance and Sexual Activity  . Alcohol use: No  . Drug use: No  . Sexual activity: Not on file  Other Topics Concern  . Not on file  Social History Narrative  . Not on file    FAMILY HISTORY: Family History  Problem Relation Age of Onset  . Heart disease Mother   . Heart attack Mother   . Heart disease Father   . Heart attack Father   . Heart disease Brother   . Stroke Brother   . Hypertension Brother   . Diabetes Maternal Aunt   . Cancer Cousin 86       pat first cousin with breast cancer   .  Leukemia Cousin   . Leukemia Sister 68    ALLERGIES:  is allergic to ppd [tuberculin purified protein derivative] and tricor [fenofibrate].  MEDICATIONS:  Current Outpatient Medications  Medication Sig Dispense Refill  . aspirin 81 MG tablet Take 81 mg by mouth daily.    Marland Kitchen atenolol (TENORMIN) 25 MG tablet TAKE 1/2 TO 1 TABLET BY MOUTH DAILY AS DIRECTED (Patient taking differently: Take 12.5 mg by mouth daily) 90 tablet 1  . benazepril (LOTENSIN) 40 MG tablet TAKE 1 TABLET BY MOUTH EVERY DAY FOR BLOOD PRESSURE (Patient taking differently: Take 20 mg by mouth daily) 90 tablet 3  . Cholecalciferol (VITAMIN D) 2000 UNITS CAPS Take 2,000 Units by mouth 2 (two) times daily.     Marland Kitchen esomeprazole (NEXIUM) 40 MG capsule Take 1 capsule (40 mg total) by mouth daily. 30 capsule 1  . glipiZIDE (GLUCOTROL) 5 MG tablet TAKE 1/2 TO 1 TABLET BY MOUTH TWICE DAILY WITH MEALS (Patient taking differently: Take 10 mg by mouth twice daily) 180 tablet 1  . magnesium gluconate (MAGONATE) 500 MG tablet Take 500 mg by mouth daily.    . metFORMIN (GLUCOPHAGE XR) 500 MG 24 hr tablet Take 2 tablets 2 x / day for Diabetes (Patient taking differently:  Take 1,000 mg by mouth 2 (two) times daily. ) 360 tablet 1  . Omega-3 Fatty Acids (FISH OIL PO) Take 1 capsule by mouth 2 (two) times daily.     . pantoprazole (PROTONIX) 40 MG tablet Take 40 mg by mouth daily.    . prochlorperazine (COMPAZINE) 10 MG tablet Take 1 tablet (10 mg total) by mouth every 6 (six) hours as needed for nausea or vomiting. 30 tablet 1  . rosuvastatin (CRESTOR) 20 MG tablet Take 0.5 tablets (10 mg total) by mouth daily. 90 tablet 1  . traMADol (ULTRAM) 50 MG tablet Take 1 tablet every 4 hours if needed for pain (Patient taking differently: Take 50 mg by mouth every 4 (four) hours as needed for moderate pain. ) 30 tablet 0  . HYDROcodone-acetaminophen (NORCO) 5-325 MG tablet Take 1 tablet by mouth every 6 (six) hours as needed for moderate pain. 30 tablet 0  . mirtazapine (REMERON) 7.5 MG tablet Take 1 tablet (7.5 mg total) by mouth at bedtime. 30 tablet 1  . nitroGLYCERIN (NITROSTAT) 0.4 MG SL tablet Place 1 tablet (0.4 mg total) under the tongue every 5 (five) minutes x 3 doses as needed for chest pain. (Patient not taking: Reported on 12/09/2017) 25 tablet 2  . ONE TOUCH ULTRA TEST test strip USE ONCE A DAY AS DIRECTED (Patient not taking: Reported on 11/22/2017) 100 each 5   No current facility-administered medications for this visit.     REVIEW OF SYSTEMS:   Constitutional: Denies fevers, chills or abnormal night sweats, (+) fatigue  Eyes: Denies blurriness of vision, double vision or watery eyes Ears, nose, mouth, throat, and face: Denies mucositis or sore throat Respiratory: Denies cough, dyspnea or wheezes Cardiovascular: Denies palpitation, chest discomfort or lower extremity swelling Gastrointestinal:  (+) right-sided abdominal pain Skin: Denies abnormal skin rashes Lymphatics: Denies new lymphadenopathy or easy bruising Neurological:Denies numbness, tingling or new weaknesses Behavioral/Psych: Mood is stable, no new changes  All other systems were reviewed with  the patient and are negative.  PHYSICAL EXAMINATION: ECOG PERFORMANCE STATUS: 2-3  Vitals:   12/09/17 1316  BP: 118/72  Pulse: 71  Resp: 20  Temp: 97.6 F (36.4 C)  SpO2: 100%   Filed Weights  12/09/17 1316  Weight: 163 lb 1.6 oz (74 kg)    GENERAL:alert, no distress and comfortable SKIN: skin color, texture, turgor are normal, no rashes or significant lesions EYES: normal, conjunctiva are pink and non-injected, sclera clear OROPHARYNX:no exudate, no erythema and lips, buccal mucosa, and tongue normal  NECK: supple, thyroid normal size, non-tender, without nodularity LYMPH:  no palpable lymphadenopathy in the cervical, axillary or inguinal LUNGS: clear to auscultation and percussion with normal breathing effort HEART: regular rate & rhythm and no murmurs and no lower extremity edema ABDOMEN:abdomen soft, moderate tenderness in the epigastric area, no hepatomegaly or palpable mass Musculoskeletal:no cyanosis of digits and no clubbing  PSYCH: alert & oriented x 3 with fluent speech NEURO: no focal motor/sensory deficits  LABORATORY DATA:  I have reviewed the data as listed CBC Latest Ref Rng & Units 12/09/2017 11/27/2017 11/19/2017  WBC 4.0 - 10.3 K/uL 17.7(H) 17.4(H) 26.4(H)  Hemoglobin 13.0 - 17.0 g/dL - 11.4(L) -  Hematocrit 38.4 - 49.9 % 34.0(L) 36.8(L) 40.5  Platelets 140 - 400 K/uL 354 240 341   CMP Latest Ref Rng & Units 12/09/2017 11/19/2017 11/05/2017  Glucose 70 - 140 mg/dL 163(H) 204(H) 105(H)  BUN 7 - 26 mg/dL 14 27(H) 19  Creatinine 0.70 - 1.30 mg/dL 0.76 0.87 0.78  Sodium 136 - 145 mmol/L 133(L) 132(L) 136  Potassium 3.5 - 5.1 mmol/L 4.8 4.5 4.5  Chloride 98 - 109 mmol/L 94(L) 92(L) 99  CO2 22 - 29 mmol/L 25 28 28   Calcium 8.4 - 10.4 mg/dL 10.1 9.9 9.6  Total Protein 6.4 - 8.3 g/dL 6.1(L) 6.4 6.3  Total Bilirubin 0.2 - 1.2 mg/dL 0.4 0.6 0.5  Alkaline Phos 40 - 150 U/L 287(H) 193(H) -  AST 5 - 34 U/L 23 17 18   ALT 0 - 55 U/L 55 38 34    RADIOGRAPHIC  STUDIES: I have personally reviewed the radiological images as listed and agreed with the findings in the report. Dg Eye Foreign Body  Result Date: 11/11/2017 CLINICAL DATA:  Metal working/exposure; clearance prior to MRI EXAM: ORBITS FOR FOREIGN BODY - 2 VIEW COMPARISON:  None. FINDINGS: There is no evidence of metallic foreign body within the orbits. No significant bone abnormality identified. IMPRESSION: No evidence of metallic foreign body within the orbits. Electronically Signed   By: Kristine Garbe M.D.   On: 11/11/2017 15:40   Ct Chest Wo Contrast  Result Date: 11/25/2017 CLINICAL DATA:  Pancreatic cancer. EXAM: CT CHEST WITHOUT CONTRAST TECHNIQUE: Multidetector CT imaging of the chest was performed following the standard protocol without IV contrast. COMPARISON:  MR abdomen 11/15/2017 and CT abdomen pelvis 11/08/2017. FINDINGS: Cardiovascular: Atherosclerotic calcification of the arterial vasculature, including extensive three-vessel involvement of the coronary arteries. Heart is at the upper limits of normal in size. No pericardial effusion. Mediastinum/Nodes: No pathologically enlarged mediastinal or axillary lymph nodes. Hilar regions are difficult to definitively evaluate without IV contrast. Esophagus is grossly unremarkable. Lungs/Pleura: Scarring in the lower lobes, left greater than right. Lungs are otherwise clear. No pleural fluid. Airway is unremarkable. Upper Abdomen: Heterogeneous masses in the liver measure up to approximately 8.7 x 10.2 cm, better evaluated on 12/05/2017. Visualized portions of the gallbladder, adrenal glands and right kidney are unremarkable. Tiny stone in the left kidney. Spleen is unremarkable. Pancreatic body/tail mass, better seen and measured on comparison exams. Visualized portions of the stomach and bowel are grossly unremarkable. Left hemidiaphragm is elevated. Musculoskeletal: Degenerative changes in the spine. No worrisome lytic or sclerotic  lesions. IMPRESSION: 1. No evidence of metastatic disease in the chest. 2. Pancreatic and liver masses are better evaluated on 11/15/2017. 3. Aortic atherosclerosis (ICD10-170.0). Coronary artery calcification. 4. Tiny left renal stone. Electronically Signed   By: Lorin Picket M.D.   On: 11/25/2017 08:34   Mr Abdomen W Wo Contrast  Result Date: 11/16/2017 CLINICAL DATA:  Evaluate pancreatic mass and liver lesions seen on recent CT scan. EXAM: MRI ABDOMEN WITHOUT AND WITH CONTRAST TECHNIQUE: Multiplanar multisequence MR imaging of the abdomen was performed both before and after the administration of intravenous contrast. CONTRAST:  62m MULTIHANCE GADOBENATE DIMEGLUMINE 529 MG/ML IV SOLN COMPARISON:  CT scan 11/08/2017 FINDINGS: Lower chest: No obvious lung lesions. No pleural or pericardial effusion. Hepatobiliary: Numerous large liver lesions as demonstrated on the CT scan consistent with liver metastasis. Large confluent mass at the hepatic dome measures 9.8 x 8.0 x 8.2 cm. Slightly more inferior and lateral lesion measures 2.8 x 2.4 cm. 2.5 x 1.7 cm lesion in the caudate lobe. There are also 2 smaller segment 6 lesions. Pancreas: As demonstrated on the CT scan there is a large necrotic appearing lesion in the pancreatic body measuring 4.2 x 3.0 cm. This is likely the primary neoplasm and associated hepatic metastatic disease. There is pancreatic ductal dilatation proximal to the lesion. The pancreatic head is normal. No common bile duct dilatation. Spleen:  Mildly enlarged measuring 16.3 x 9.0 x 7.4 cm. No focal lesions. Adrenals/Urinary Tract: The adrenal glands and kidneys are unremarkable. There is a 2.0 x 1.7 cm partially cystic lesion projecting off the midpole lower pole junction region of the left kidney posteriorly. The non-cystic portion demonstrates contrast enhancement and is worrisome for a small renal neoplasm. The right kidney is unremarkable. Stomach/Bowel: Visualized portions within the  abdomen are unremarkable. Vascular/Lymphatic: Advanced atherosclerotic changes involving the aorta. The branch vessels are patent. The major venous structures are patent. No enlarged mesenteric or retroperitoneal lymph nodes are identified. Other:  No ascites or abdominal wall hernia. Musculoskeletal: No findings suspicious for hepatic metastatic disease. IMPRESSION: 1. 4.2 x 3.0 cm cystic appearing pancreatic body mass likely reflecting pancreatic cancer with associated extensive hepatic metastatic disease. 2. 2.0 x 1.7 cm partially cystic but enhancing left renal lesion consistent with small renal neoplasm. 3. No abdominal lymphadenopathy. Electronically Signed   By: PMarijo SanesM.D.   On: 11/16/2017 09:32   [place]  Result Date: 11/27/2017 INDICATION: 72year old with multiple liver lesions, pancreatic lesion and left renal lesion. Tissue diagnosis is needed. EXAM: ULTRASOUND-GUIDED LIVER LESION BIOPSY WITH ULTRASOUND GUIDANCE MEDICATIONS: None. ANESTHESIA/SEDATION: Moderate (conscious) sedation was employed during this procedure. A total of Versed 2.0 mg and Fentanyl 100 mcg was administered intravenously. Moderate Sedation Time: 18 minutes. The patient's level of consciousness and vital signs were monitored continuously by radiology nursing throughout the procedure under my direct supervision. FLUOROSCOPY TIME:  None COMPLICATIONS: None immediate. PROCEDURE: Informed written consent was obtained from the patient after a thorough discussion of the procedural risks, benefits and alternatives. All questions were addressed. A timeout was performed prior to the initiation of the procedure. Liver was evaluated with ultrasound. Multiple right hepatic lesions were identified. A peripheral lesion in the right hepatic lobe was targeted for biopsy. Right side of the abdomen was prepped and draped in sterile fashion. Skin and soft tissues were anesthetized with 1% lidocaine. 129gauge needle directed into  the right hepatic lobe and right hepatic lesion with ultrasound guidance. Total of 3 core biopsies obtained  with an 18 gauge device. Specimens placed in formalin. Gel-Foam slurry was injected through the 17 gauge needle as it was removed. Bandage placed over the puncture site. FINDINGS: Right hepatic lesions, largest at the hepatic dome. Peripheral lesion in the right hepatic lobe was targeted for biopsy. Biopsy needle was confirmed within the lesion. No evidence for bleeding or hematoma formation following the core biopsies. IMPRESSION: Successful ultrasound-guided core biopsies of a right hepatic lesion. Electronically Signed   By: Markus Daft M.D.   On: 11/27/2017 15:29   CT Chest WO Contrast 11/22/17 IMPRESSION: 1. No evidence of metastatic disease in the chest. 2. Pancreatic and liver masses are better evaluated on 11/15/2017. 3. Aortic atherosclerosis (ICD10-170.0). Coronary artery calcification. 4. Tiny left renal stone.  MRI Abdomen W WO Contrast 11/15/17 IMPRESSION: 1. 4.2 x 3.0 cm cystic appearing pancreatic body mass likely reflecting pancreatic cancer with associated extensive hepatic metastatic disease. 2. 2.0 x 1.7 cm partially cystic but enhancing left renal lesion consistent with small renal neoplasm. 3. No abdominal lymphadenopathy.  CT AP W Contrast 11/08/17 IMPRESSION: 1. 6.4 centimeter low-attenuation pancreatic mass suspicious for neoplasm. Although focal pancreatitis/pseudocysts are considerations, given the lack of secondary signs of pancreatitis, malignancy is favored. Recommend further evaluation with MRI. 2. Numerous liver lesions suspicious for metastatic disease. 3. Splenomegaly. 4. Indeterminate left renal mass measuring 1.8 centimeters, warranting further characterization. Recommend further evaluation with renal protocol MRI. 5.  Aortic atherosclerosis.  (ICD10-I70.0) 6. Prostatic enlargement. 7. Bilateral fat containing inguinal hernias. 8. Coronary artery  atherosclerosis.  ASSESSMENT & PLAN: 72 y.o. patient male, with past medical history of coronary artery disease, status post stent placement in 2016, hypertension, diabetes, CKD, presented with epigastric pain, fatigue, low appetite and weight loss  1.  Pancreatic cancer with liver metastasis ([facility]) Stage IV (cT3, cNX, pM1) -I have previously reviewed his abdominal CT and MRI images and findings with patient and he his wife in person. This is highly suspicious for metastatic pancreatic cancer.  -He underwent liver biopsy on November 27, 2017, I spoke with pathologist Dr. Barry Dienes today, biopsy is positive for metastatic carcinoma, immunostains studies are still pending.  During the scan findings, this is consistent with metastatic pancreatic cancer to liver. -Unfortunately this is incurable disease. The goal of therapy is likely palliative. -I have request [facility] genomic testing to see if he is a candidate for immunotherapy or targeted therapy. -I referred him to genetics to check BRCA mutation, to see if he is a candidate for PARP inhibitor. She was seen by genetic counseling and had a blood test done today. -I again discussed the systemic chemotherapy option, likely single agent gemcitabine versus palliative care alone and hospice.  Due to his age, limited performance status, he is not a good candidate for intensive chemotherapy.  Patient is very reluctant to take chemotherapy, due to the concern of side effects. -After a lengthy discussion, Pt values Quality of life. He is leading towards home care and hospice, but would like to have more time to make the final decision.  Is to call me when he is ready to meet hospice team. --To help with his pain, Tramadol can be increased to 3-4 times a day. I will prescribed him Oxycodone 5-325 today (12/09/17) for any significant pain.   -I encouraged him to be active through the day to combat his fatigue.  -I recommend he increase his ensure boost intake to  2-3 times a day. His weight is stable currently. I prescribed low dose  Mirtazapine to help his appetite. He should start at night as this can make him drowsy.  -F/u with me open   2. Left renal lesion -Likely neoplasm, based on the MRI, metastatic disease less likely  -due to his metastatic pancreatic cancer, will watch the renal lesion, and defer treatment for now   3. CAD, DM, HTN  -f/u with PCP -we discussed that his blood glucose may get worse due to his pancreatic cancer, will monitor closely   4. Genetics  -I gave him genetic referral previously, to check BRCA1/2 mutation. He was seen by genetics on 12/09/17  -results pending.   5. Goal of care discussion  -We again discussed the incurable nature of his cancer, and the overall poor prognosis, especially if he does not have good response to chemotherapy or progress on chemo -The patient understands the goal of care is palliative. -I recommend DNR/DNI, he will think about it   Plan -Patient declined systemic chemotherapy.  He favors supportive care alone will let me know if he is ready for hospice. -f/u open for now -I will f/u his FO and genetic test result, and let him know if he is a candidate for immunotherapy or targeted therapy.  All questions were answered. The patient knows to call the clinic with any problems, questions or concerns. I spent 30 minutes counseling the patient face to face. The total time spent in the appointment was 40 minutes and more than 50% was on counseling.  This document serves as a record of services personally performed by Truitt Merle, MD. It was created on her behalf by Joslyn Devon, a trained medical scribe. The creation of this record is based on the scribe's personal observations and the provider's statements to them.   I have reviewed the above documentation for accuracy and completeness, and I agree with the above.      Truitt Merle, MD 12/09/2017

## 2017-12-09 NOTE — Progress Notes (Signed)
REFERRING PROVIDER: Truitt Merle, MD [address] [place], [place] [address]  PRIMARY PROVIDER:  Unk Pinto, MD  PRIMARY REASON FOR VISIT:  1. Family history of breast cancer   2. Family history of leukemia   3. Malignant neoplasm of head of pancreas ([place])      HISTORY OF PRESENT ILLNESS:   Daniel Burnett, a 72 y.o. male, was seen for a [place] cancer genetics consultation at the request of Dr. Burr Medico due to a personal and family history of cancer.  Daniel Burnett presents to clinic today to discuss the possibility of a hereditary predisposition to cancer, genetic testing, and to further clarify his future cancer risks, as well as potential cancer risks for family members.   In 2019, at the age of 65, Daniel Burnett was diagnosed with pancreatic cancer. HE is trying to decide about chemotherapy.  Foundation 1 testing is being performed to learn about MSI status for Immunotherapy. He is referred today to test for BRCA mutations to determine his eligibility for PARP.   CANCER HISTORY:  Oncology History   Cancer Staging Pancreatic cancer [facility]) Staging form: Exocrine Pancreas, AJCC 8th Edition - Clinical stage from 11/27/2017: Stage IV (cT3, cNX, pM1) - Signed by Truitt Merle, MD on 11/29/2017       Pancreatic carcinoma metastatic to liver ([place])   11/08/2017 Imaging    CT AP W Contrast 11/08/17 IMPRESSION: 1. 6.4 centimeter low-attenuation pancreatic mass suspicious for neoplasm. Although focal pancreatitis/pseudocysts are considerations, given the lack of secondary signs of pancreatitis, malignancy is favored. Recommend further evaluation with MRI. 2. Numerous liver lesions suspicious for metastatic disease. 3. Splenomegaly. 4. Indeterminate left renal mass measuring 1.8 centimeters, warranting further characterization. Recommend further evaluation with renal protocol MRI. 5.  Aortic atherosclerosis.  (ICD10-I70.0) 6. Prostatic enlargement. 7. Bilateral fat containing inguinal  hernias. 8. Coronary artery atherosclerosis.       11/15/2017 Imaging    MRI Abdomen W WO Contrast 11/15/17 IMPRESSION: 1. 4.2 x 3.0 cm cystic appearing pancreatic body mass likely reflecting pancreatic cancer with associated extensive hepatic metastatic disease. 2. 2.0 x 1.7 cm partially cystic but enhancing left renal lesion consistent with small renal neoplasm. 3. No abdominal lymphadenopathy.      11/19/2017 Initial Diagnosis    Pancreatic carcinoma metastatic to liver ([place])      11/22/2017 Imaging    CT Chest WO Contrast 11/22/17 IMPRESSION: 1. No evidence of metastatic disease in the chest. 2. Pancreatic and liver masses are better evaluated on 11/15/2017. 3. Aortic atherosclerosis (ICD10-170.0). Coronary artery calcification. 4. Tiny left renal stone.      11/27/2017 Initial Biopsy            Past Medical History:  Diagnosis Date  . BPH (benign prostatic hyperplasia)   . CAD (coronary artery disease)    a. Cath 09/2015: 100% stenosis Prox LAD --> 2 DES placed, 90% D2, 99% 2nd Mrg --> DES placed, 70% RCA --> Medically manage  . Colitis   . Diabetes mellitus without complication ([place])   . ED (erectile dysfunction)   . Family history of breast cancer   . Family history of leukemia   . GERD (gastroesophageal reflux disease)   . Hyperlipidemia   . Hypertension   . Kidney stone   . Vitamin D deficiency     Past Surgical History:  Procedure Laterality Date  . CARDIAC CATHETERIZATION N/A 09/27/2015   Procedure: Left Heart Cath and Coronary Angiography;  Surgeon: Leonie Man, MD;  Location: [facility]  INVASIVE CV LAB;  Service: Cardiovascular;  Laterality: N/A;  . CARDIAC CATHETERIZATION N/A 09/27/2015   Procedure: Coronary Stent Intervention;  Surgeon: Leonie Man, MD;  Location: [place] CV LAB;  Service: Cardiovascular;  Laterality: N/A;  Lad/OM2  . CYSTOSCOPY W/ RETROGRADES  1991   stone extractions  . [place]   negative  . SQUAMOUS  CELL CARCINOMA EXCISION  2001   bridge of nose    Social History   Socioeconomic History  . Marital status: Married    Spouse name: Not on file  . Number of children: 2  . Years of education: Not on file  . Highest education level: Not on file  Social Needs  . Financial resource strain: Not on file  . Food insecurity - worry: Not on file  . Food insecurity - inability: Not on file  . Transportation needs - medical: Not on file  . Transportation needs - non-medical: Not on file  Occupational History  . Not on file  Tobacco Use  . Smoking status: Former Smoker    Packs/day: 0.25    Years: 5.00    Pack years: 1.25    Last attempt to quit: 10/08/1962    Years since quitting: 55.2  . Smokeless tobacco: Never Used  Substance and Sexual Activity  . Alcohol use: No  . Drug use: No  . Sexual activity: Not on file  Other Topics Concern  . Not on file  Social History Narrative  . Not on file     FAMILY HISTORY:  We obtained a detailed, 4-generation family history.  Significant diagnoses are listed below: Family History  Problem Relation Age of Onset  . Heart disease Mother   . Heart attack Mother   . Heart disease Father   . Heart attack Father   . Heart disease Brother   . Stroke Brother   . Hypertension Brother   . Diabetes Maternal Aunt   . Cancer Cousin 51       pat first cousin with breast cancer   . Leukemia Cousin   . Leukemia Sister 70    The patient has one son and one daughter who are cancer free.  He has four brothers and six sisters.  One sister had leukemia, and one sister had something going on with her intestines, but the patient is not sure if it was cancer.  Both parents are deceased.  The patient's mother died from heart disease.  She had four siblings, none who had cancer.  The maternal grandparents are deceased from non cancer related issues.  The patient's father died of heart disease.  He had multiple siblings, none had cancer that patient is aware  of.  One of his sisters had a daughter who had breast cancer at 31.  The paternal grandparents are deceased.  The grandfather had "stomach troubles" and the grandmother died of old age.  Daniel Burnett is unaware of previous family history of genetic testing for hereditary cancer risks. Patient's maternal ancestors are of [place] descent, and paternal ancestors are of Caucasian descent. There is no reported Ashkenazi Jewish ancestry. There is no known consanguinity.  GENETIC COUNSELING ASSESSMENT: Daniel Burnett is a 72 y.o. male with a personal history of pancreatic cancer and a family history of breast cancer which is somewhat suggestive of a hereditary cancer syndrome and predisposition to cancer. We, therefore, discussed and recommended the following at today's visit.   DISCUSSION: We discussed that about 5-10% of pancreatic cancer  is hereditary, with most cases due to BRCA mutations, as well as Lynch syndrome.  We discussed that the benefit of genetic testing is not only to learn if there is a hereditary cancer syndrome, but also to determine whether he would benefit from a PARP inhibitor.  We discussed that PARP's are oral medications that can help treat cancers that are due to BRCA mutations.  We also discussed that not all individuals who have BRCA mutations will be eligible for PARP's.  This would be a conversation to have with his oncologist.  We reviewed the characteristics, features and inheritance patterns of hereditary cancer syndromes. We also discussed genetic testing, including the appropriate family members to test, the process of testing, insurance coverage and turn-around-time for results. We discussed the implications of a negative, positive and/or variant of uncertain significant result. We recommended Daniel Burnett pursue genetic testing for the common hereditary cancer gene panel.   Based on Daniel Burnett's personal and family history of cancer, he meets medical criteria for genetic testing.  Despite that he meets criteria, he may still have an out of pocket cost. We discussed that if his out of pocket cost for testing is over $100, the laboratory will call and confirm whether he wants to proceed with testing.  If the out of pocket cost of testing is less than $100 he will be billed by the genetic testing laboratory.   PLAN: After considering the risks, benefits, and limitations, Daniel Burnett  provided informed consent to pursue genetic testing and the blood sample was sent to [facility] for analysis of the Common Hereditary Cancer panel. Results should be available within approximately 2-3 weeks' time, at which point they will be disclosed by telephone to Daniel Burnett, as will any additional recommendations warranted by these results. Daniel Burnett will receive a summary of his genetic counseling visit and a copy of his results once available. This information will also be available in Epic. We encouraged Daniel Burnett to remain in contact with cancer genetics annually so that we can continuously update the family history and inform him of any changes in cancer genetics and testing that may be of benefit for his family. Daniel Burnett questions were answered to his satisfaction today. Our contact information was provided should additional questions or concerns arise.  Lastly, we encouraged Daniel Burnett to remain in contact with cancer genetics annually so that we can continuously update the family history and inform him of any changes in cancer genetics and testing that may be of benefit for this family.   Mr.  Burnett questions were answered to his satisfaction today. Our contact information was provided should additional questions or concerns arise. Thank you for the referral and allowing [place] to share in the care of your patient.   Clemma Johnsen P. Florene Glen, [place], [facility] Certified Genetic Counselor [email or web address] phone: [phone]  The patient was seen for a total of 45 minutes in  face-to-face genetic counseling.  This patient was discussed with Drs. Magrinat, Lindi Adie and/or Burr Medico who agrees with the above.    _______________________________________________________________________ For Office Staff:  Number of people involved in session: 2 Was an Intern/ student involved with case: no

## 2017-12-09 NOTE — Progress Notes (Signed)
  Oncology Nurse Navigator Documentation  Navigator Location: [facility] (12/09/17 1403)   )Navigator Encounter Type: Follow-up Appt (12/09/17 1403)                             Interventions: Psycho-social support (12/09/17 1403) Spoke with patient and wife after f/u appointment with Dr. Burr Medico. Patient has made the decision to not take chemo. Patient shared that he is afraid of the side effects and what his life would be like. Patient's wife shared that she finds comfort from knowing that Hospice is an option when the patient makes the decision to start the referral. Mrs. Salm shared that her son and daughter are processing the cancer diagnosis in different ways. Their adult son wants Mr. Krahl to "pick himself up and get better." Mrs. lingenfelter shared that she will support whatever decisions her husband voices. Patient and wife understand that they can call with questions or concerns. I will check in with patient early next week.            Acuity: Level 2 (12/09/17 1403)         Time Spent with Patient: 30 (12/09/17 1403)

## 2017-12-10 ENCOUNTER — Encounter: Payer: PPO | Admitting: Nutrition

## 2017-12-13 NOTE — Progress Notes (Signed)
Cardiology Office Note   Date:  12/16/2017   ID:  TREA LATNER, DOB 06/08/1946, MRN [id number]  PCP:  Unk Pinto, MD  Cardiologist:  Maysen Bonsignore Martinique, MD   Chief Complaint  Patient presents with  . Follow-up    6 months  . Coronary Artery Disease    History of Present Illness: Daniel Burnett is a 72 y.o. male with a history of HTN, HL, NIDDM, CKD III, BPH, d/c 09/29/15 after NSTEMI w/ DES x 2 LAD & DES OM2. Med rx for 70% RCA, EF 55-60% by echo.  When seen last he complained of flank and abdominal pain. Seen by primary care. WBC elevated with eosinophilia. Chemistries were normal. CT of abdomen showed a pancreatic mass with liver metastases. Liver bx consistent with pancreatic CA. Seen by Oncology and palliative care recommended. Since I last saw him in September he has lost 17 lbs. Notes a lot of nausea- po intake is poor. Not having much pain though. BP stable. Noted BS was higher on steroids but he is off this now.     Past Medical History:  Diagnosis Date  . BPH (benign prostatic hyperplasia)   . CAD (coronary artery disease)    a. Cath 09/2015: 100% stenosis Prox LAD --> 2 DES placed, 90% D2, 99% 2nd Mrg --> DES placed, 70% RCA --> Medically manage  . Colitis   . Diabetes mellitus without complication ([place])   . ED (erectile dysfunction)   . Family history of breast cancer   . Family history of leukemia   . GERD (gastroesophageal reflux disease)   . Hyperlipidemia   . Hypertension   . Kidney stone   . Vitamin D deficiency     Past Surgical History:  Procedure Laterality Date  . CARDIAC CATHETERIZATION N/A 09/27/2015   Procedure: Left Heart Cath and Coronary Angiography;  Surgeon: Leonie Man, MD;  Location: [place] CV LAB;  Service: Cardiovascular;  Laterality: N/A;  . CARDIAC CATHETERIZATION N/A 09/27/2015   Procedure: Coronary Stent Intervention;  Surgeon: Leonie Man, MD;  Location: [place] CV LAB;  Service: Cardiovascular;  Laterality:  N/A;  Lad/OM2  . CYSTOSCOPY W/ RETROGRADES  1991   stone extractions  . [place]   negative  . SQUAMOUS CELL CARCINOMA EXCISION  2001   bridge of nose    Current Outpatient Medications  Medication Sig Dispense Refill  . aspirin 81 MG tablet Take 81 mg by mouth daily.    Marland Kitchen atenolol (TENORMIN) 25 MG tablet TAKE 1/2 TO 1 TABLET BY MOUTH DAILY AS DIRECTED (Patient taking differently: Take 12.5 mg by mouth daily) 90 tablet 1  . benazepril (LOTENSIN) 40 MG tablet TAKE 1 TABLET BY MOUTH EVERY DAY FOR BLOOD PRESSURE (Patient taking differently: Take 20 mg by mouth daily) 90 tablet 3  . Cholecalciferol (VITAMIN D) 2000 UNITS CAPS Take 2,000 Units by mouth 2 (two) times daily.     Marland Kitchen esomeprazole (NEXIUM) 40 MG capsule Take 1 capsule (40 mg total) by mouth daily. 30 capsule 1  . glipiZIDE (GLUCOTROL) 5 MG tablet TAKE 1/2 TO 1 TABLET BY MOUTH TWICE DAILY WITH MEALS (Patient taking differently: Take 10 mg by mouth twice daily) 180 tablet 1  . HYDROcodone-acetaminophen (NORCO) 5-325 MG tablet Take 1 tablet by mouth every 6 (six) hours as needed for moderate pain. 30 tablet 0  . magnesium gluconate (MAGONATE) 500 MG tablet Take 500 mg by mouth daily.    . metFORMIN (GLUCOPHAGE  XR) 500 MG 24 hr tablet Take 2 tablets 2 x / day for Diabetes (Patient taking differently: Take 1,000 mg by mouth 2 (two) times daily. ) 360 tablet 1  . mirtazapine (REMERON) 7.5 MG tablet Take 1 tablet (7.5 mg total) by mouth at bedtime. 30 tablet 1  . nitroGLYCERIN (NITROSTAT) 0.4 MG SL tablet Place 1 tablet (0.4 mg total) under the tongue every 5 (five) minutes x 3 doses as needed for chest pain. 25 tablet 2  . Omega-3 Fatty Acids (FISH OIL PO) Take 1 capsule by mouth 2 (two) times daily.     . ONE TOUCH ULTRA TEST test strip USE ONCE A DAY AS DIRECTED 100 each 5  . pantoprazole (PROTONIX) 40 MG tablet Take 40 mg by mouth daily.    . prochlorperazine (COMPAZINE) 10 MG tablet Take 1 tablet (10 mg total) by mouth  every 6 (six) hours as needed for nausea or vomiting. 30 tablet 1  . rosuvastatin (CRESTOR) 20 MG tablet Take 0.5 tablets (10 mg total) by mouth daily. 90 tablet 1  . traMADol (ULTRAM) 50 MG tablet Take 1 tablet every 4 hours if needed for pain (Patient taking differently: Take 50 mg by mouth every 4 (four) hours as needed for moderate pain. ) 30 tablet 0   No current facility-administered medications for this visit.     Allergies:   Ppd [tuberculin purified protein derivative] and Tricor [fenofibrate]    Social History:  The patient  reports that he quit smoking about 55 years ago. He has a 1.25 pack-year smoking history. he has never used smokeless tobacco. He reports that he does not drink alcohol or use drugs.   Family History:  The patient's family history includes Cancer (age of onset: 63) in his cousin; Diabetes in his maternal aunt; Heart attack in his father and mother; Heart disease in his brother, father, and mother; Hypertension in his brother; Leukemia in his cousin; Leukemia (age of onset: 18) in his sister; Stroke in his brother.    ROS:  Please see the history of present illness. All other systems are reviewed and negative.    PHYSICAL EXAM: VS:  BP (!) 112/58   Pulse 74   Ht 5\' 10"  (1.778 m)   Wt 161 lb (73 kg)   BMI 23.10 kg/m  , BMI Body mass index is 23.1 kg/m. GENERAL:   WM in NAD HEENT:  PERRL, EOMI, sclera are clear. Oropharynx is clear. NECK:  No jugular venous distention, carotid upstroke brisk and symmetric, no bruits, no thyromegaly or adenopathy LUNGS:  Clear to auscultation bilaterally CHEST:  Unremarkable HEART:  RRR,  PMI not displaced or sustained,S1 and S2 within normal limits, no S3, no S4: no clicks, no rubs, 2-5/4 systolic murmur LSB.  ABD:  Soft, nontender. BS +, no masses or bruits. No hepatomegaly, no splenomegaly EXT:  2 + pulses throughout, no edema, no cyanosis no clubbing SKIN:  Warm and dry.  No rashes NEURO:  Alert and oriented x 3.  Cranial nerves II through XII intact. PSYCH:  Cognitively intact     EKG:  EKG is ordered today. NSR rate 74, possible old septal infarct. I have personally reviewed and interpreted this study.    Recent Labs: 08/05/2017: Magnesium 2.0; TSH 1.71 11/27/2017: Hemoglobin 11.4 12/09/2017: ALT 55; BUN 14; Creatinine 0.76; Platelet Count 354; Potassium 4.8; Sodium 133    Lipid Panel    Component Value Date/Time   CHOL 126 08/05/2017 1035   TRIG 109  08/05/2017 1035   HDL 65 08/05/2017 1035   CHOLHDL 1.9 08/05/2017 1035   VLDL 19 04/29/2017 0913   LDLCALC 26 04/29/2017 0913     Wt Readings from Last 3 Encounters:  12/16/17 161 lb (73 kg)  12/09/17 163 lb 1.6 oz (74 kg)  11/29/17 162 lb 8 oz (73.7 kg)     Other studies Reviewed: Additional studies/ records that were reviewed today include: none  ASSESSMENT AND PLAN:  1.  CAD s/p NSTEMI in December 2016. Extensive stenting of the LAD and first OM. Residual 70% disease in the distal RCA. He is asymptomatic. He is on appropriate therapy with aspirin, beta blocker, ACE inhibitor and statin.   2. Hyperlipidemia. Excellent control on Crestor.   3. HTN well controlled.  4. DM type 2 with CKD. Followed by primary care. Currently on glpizide and metformin.  5. Stage IV pancreatic CA.  Prognosis is very poor with his cancer. We discussed scaling back his BP or diabetic medication if BP or BS gets lower.   I will follow up PRN now.   Signed, Lailyn Appelbaum Martinique, MD  12/16/2017 8:37 AM    [facility]

## 2017-12-16 ENCOUNTER — Ambulatory Visit: Payer: PPO | Admitting: Cardiology

## 2017-12-16 ENCOUNTER — Encounter: Payer: Self-pay | Admitting: Cardiology

## 2017-12-16 ENCOUNTER — Telehealth: Payer: Self-pay

## 2017-12-16 VITALS — BP 112/58 | HR 74 | Ht 70.0 in | Wt 161.0 lb

## 2017-12-16 DIAGNOSIS — I2510 Atherosclerotic heart disease of native coronary artery without angina pectoris: Secondary | ICD-10-CM | POA: Diagnosis not present

## 2017-12-16 DIAGNOSIS — E782 Mixed hyperlipidemia: Secondary | ICD-10-CM

## 2017-12-16 DIAGNOSIS — I10 Essential (primary) hypertension: Secondary | ICD-10-CM

## 2017-12-16 DIAGNOSIS — E1121 Type 2 diabetes mellitus with diabetic nephropathy: Secondary | ICD-10-CM | POA: Diagnosis not present

## 2017-12-16 NOTE — Telephone Encounter (Signed)
Mrs. Stepanek called to say that Mr. Engelbert would like a Hospice referral and would not like chemo therapy. I encouraged Mrs. Esbenshade to call with questions or concerns. I relayed information to Dr. Burr Medico and Cira Rue NP.

## 2017-12-16 NOTE — Patient Instructions (Addendum)
Continue your current therapy   

## 2017-12-17 ENCOUNTER — Telehealth: Payer: Self-pay | Admitting: *Deleted

## 2017-12-17 NOTE — Telephone Encounter (Signed)
Spoke with Amy, new pt referral for [facility].  Referral made today for hospice;  Dr. Burr Medico will be hospice attending;  Hospice providers to assist with symptoms management, and activate hospice standing orders as appropriate.

## 2017-12-18 DIAGNOSIS — C259 Malignant neoplasm of pancreas, unspecified: Secondary | ICD-10-CM | POA: Diagnosis not present

## 2017-12-19 ENCOUNTER — Telehealth: Payer: Self-pay | Admitting: *Deleted

## 2017-12-19 ENCOUNTER — Encounter (HOSPITAL_COMMUNITY): Payer: Self-pay | Admitting: Hematology

## 2017-12-19 ENCOUNTER — Other Ambulatory Visit: Payer: Self-pay | Admitting: Internal Medicine

## 2017-12-19 DIAGNOSIS — E119 Type 2 diabetes mellitus without complications: Secondary | ICD-10-CM

## 2017-12-19 MED ORDER — METFORMIN HCL ER 500 MG PO TB24: ORAL_TABLET | ORAL | 1 refills | Status: DC

## 2017-12-19 NOTE — Telephone Encounter (Signed)
Received vm call from Merla Riches RN/Hospice/[place] stating that pt was admitted & request DNR.  She is asking if Dr Burr Medico would give verbal order for DNR & have Hospice MD to sign.  Call order to Triage RN @ [phone].  Message to Dr Burr Medico. Called Hospice Triage RN & gave OK for Hospice MD to do DNR.

## 2017-12-20 ENCOUNTER — Telehealth: Payer: Self-pay | Admitting: Genetic Counselor

## 2017-12-20 ENCOUNTER — Encounter: Payer: Self-pay | Admitting: Genetic Counselor

## 2017-12-20 ENCOUNTER — Ambulatory Visit: Payer: Self-pay | Admitting: Genetic Counselor

## 2017-12-20 DIAGNOSIS — Z1379 Encounter for other screening for genetic and chromosomal anomalies: Secondary | ICD-10-CM | POA: Insufficient documentation

## 2017-12-20 DIAGNOSIS — C259 Malignant neoplasm of pancreas, unspecified: Secondary | ICD-10-CM

## 2017-12-20 DIAGNOSIS — Z803 Family history of malignant neoplasm of breast: Secondary | ICD-10-CM

## 2017-12-20 DIAGNOSIS — Z806 Family history of leukemia: Secondary | ICD-10-CM

## 2017-12-20 DIAGNOSIS — C787 Secondary malignant neoplasm of liver and intrahepatic bile duct: Secondary | ICD-10-CM

## 2017-12-20 NOTE — Telephone Encounter (Signed)
Revealed negative genetic testing.  Discussed that we do not know why he has pancreatic cancer or why there is cancer in the family. It could be due to a different gene that we are not testing, or maybe our current technology may not be able to pick something up.  It will be important for him to keep in contact with genetics to keep up with whether additional testing may be needed.  Discussed that there are two VUS.  Still considered this a normal result. Patient asked about cost.  Explained that lab told me that they would have $100 or less OOP.  I had asked that they call with any OOP.  Will follow up with lab.

## 2017-12-20 NOTE — Progress Notes (Signed)
HPI: Daniel Burnett was previously seen in the [place] clinic due to a personal and family history of cancer and concerns regarding a hereditary predisposition to cancer. Please refer to our prior cancer genetics clinic note for more information regarding Daniel Burnett's medical, social and family histories, and our assessment and recommendations, at the time. Daniel Burnett recent genetic test results were disclosed to him, as were recommendations warranted by these results. These results and recommendations are discussed in more detail below.  CANCER HISTORY:  Oncology History   Cancer Staging Pancreatic cancer [facility]) Staging form: Exocrine Pancreas, AJCC 8th Edition - Clinical stage from 11/27/2017: Stage IV (cT3, cNX, pM1) - Signed by Truitt Merle, MD on 11/29/2017       Pancreatic cancer ([address])   11/11/2017 Initial Diagnosis    Pancreatic cancer [facility])       Pancreatic carcinoma metastatic to liver ([place])   11/08/2017 Imaging    CT AP W Contrast 11/08/17 IMPRESSION: 1. 6.4 centimeter low-attenuation pancreatic mass suspicious for neoplasm. Although focal pancreatitis/pseudocysts are considerations, given the lack of secondary signs of pancreatitis, malignancy is favored. Recommend further evaluation with MRI. 2. Numerous liver lesions suspicious for metastatic disease. 3. Splenomegaly. 4. Indeterminate left renal mass measuring 1.8 centimeters, warranting further characterization. Recommend further evaluation with renal protocol MRI. 5.  Aortic atherosclerosis.  (ICD10-I70.0) 6. Prostatic enlargement. 7. Bilateral fat containing inguinal hernias. 8. Coronary artery atherosclerosis.       11/15/2017 Imaging    MRI Abdomen W WO Contrast 11/15/17 IMPRESSION: 1. 4.2 x 3.0 cm cystic appearing pancreatic body mass likely reflecting pancreatic cancer with associated extensive hepatic metastatic disease. 2. 2.0 x 1.7 cm partially cystic but enhancing left renal lesion consistent  with small renal neoplasm. 3. No abdominal lymphadenopathy.      11/19/2017 Initial Diagnosis    Pancreatic carcinoma metastatic to liver ([place])      11/22/2017 Imaging    CT Chest WO Contrast 11/22/17 IMPRESSION: 1. No evidence of metastatic disease in the chest. 2. Pancreatic and liver masses are better evaluated on 11/15/2017. 3. Aortic atherosclerosis (ICD10-170.0). Coronary artery calcification. 4. Tiny left renal stone.      11/27/2017 Initial Biopsy         12/19/2017 Genetic Testing    APC c.2090C>T and ATM c.1661C<T VUS found on the common hereditary cancer panel.  The Hereditary Gene Panel offered by [facility] includes sequencing and/or deletion duplication testing of the following 47 genes: APC, ATM, AXIN2, BARD1, BMPR1A, BRCA1, BRCA2, BRIP1, CDH1, CDK4, CDKN2A (p14ARF), CDKN2A (p16INK4a), CHEK2, CTNNA1, DICER1, EPCAM (Deletion/duplication testing only), GREM1 (promoter region deletion/duplication testing only), KIT, MEN1, MLH1, MSH2, MSH3, MSH6, MUTYH, NBN, NF1, NHTL1, PALB2, PDGFRA, PMS2, POLD1, POLE, PTEN, RAD50, RAD51C, RAD51D, SDHB, SDHC, SDHD, SMAD4, SMARCA4. STK11, TP53, TSC1, TSC2, and VHL.  The following genes were evaluated for sequence changes only: SDHA and HOXB13 c.251G>A variant only. The report date is December 19, 2017.       FAMILY HISTORY:  We obtained a detailed, 4-generation family history.  Significant diagnoses are listed below: Family History  Problem Relation Age of Onset  . Heart disease Mother   . Heart attack Mother   . Heart disease Father   . Heart attack Father   . Heart disease Brother   . Stroke Brother   . Hypertension Brother   . Diabetes Maternal Aunt   . Cancer Cousin 29       pat first cousin with breast cancer   . Leukemia  Cousin   . Leukemia Sister 37    The patient has one son and one daughter who are cancer free.  He has four brothers and six sisters.  One sister had leukemia, and one sister had something going on with her  intestines, but the patient is not sure if it was cancer.  Both parents are deceased.  The patient's mother died from heart disease.  She had four siblings, none who had cancer.  The maternal grandparents are deceased from non cancer related issues.  The patient's father died of heart disease.  He had multiple siblings, none had cancer that patient is aware of.  One of his sisters had a daughter who had breast cancer at 18.  The paternal grandparents are deceased.  The grandfather had "stomach troubles" and the grandmother died of old age.  Daniel Burnett is unaware of previous family history of genetic testing for hereditary cancer risks. Patient's maternal ancestors are of [place] descent, and paternal ancestors are of Caucasian descent. There is no reported Ashkenazi Jewish ancestry. There is no known consanguinity.  GENETIC TEST RESULTS: Genetic testing reported out on March 14 through the common hereditary cancer panel found no deleterious mutations. The Hereditary Gene Panel offered by [facility] includes sequencing and/or deletion duplication testing of the following 47 genes: APC, ATM, AXIN2, BARD1, BMPR1A, BRCA1, BRCA2, BRIP1, CDH1, CDK4, CDKN2A (p14ARF), CDKN2A (p16INK4a), CHEK2, CTNNA1, DICER1, EPCAM (Deletion/duplication testing only), GREM1 (promoter region deletion/duplication testing only), KIT, MEN1, MLH1, MSH2, MSH3, MSH6, MUTYH, NBN, NF1, NHTL1, PALB2, PDGFRA, PMS2, POLD1, POLE, PTEN, RAD50, RAD51C, RAD51D, SDHB, SDHC, SDHD, SMAD4, SMARCA4. STK11, TP53, TSC1, TSC2, and VHL.  The following genes were evaluated for sequence changes only: SDHA and HOXB13 c.251G>A variant only.  The test report has been scanned into EPIC and is located under the Molecular Pathology section of the Results Review tab.   We discussed with Daniel Burnett that since the current genetic testing is not perfect, it is possible there may be a gene mutation in one of these genes that current testing cannot detect, but that  chance is small. We also discussed, that it is possible that another gene that has not yet been discovered, or that we have not yet tested, is responsible for the cancer diagnoses in the family, and it is, therefore, important to remain in touch with cancer genetics in the future so that we can continue to offer Daniel Burnett the most up to date genetic testing.   Genetic testing did detect two Variants of Unknown Significance - in the one in the APC gene called c.2090C>T and the other in the ATM gene called c.1661C>T. At this time, it is unknown if these variants are associated with increased cancer risk or if they are normal finding, but most variants such as these get reclassified to being inconsequential. It should not be used to make medical management decisions. With time, we suspect the lab will determine the significance of this variant, if any. If we do learn more about it, we will try to contact Daniel Burnett to discuss it further. However, it is important to stay in touch with [place] periodically and keep the address and phone number up to date.     CANCER SCREENING RECOMMENDATIONS: This result is reassuring and indicates that Daniel Burnett likely does not have an increased risk for a future cancer due to a mutation in one of these genes. This normal test also suggests that Daniel Burnett's cancer was most likely not due to  an inherited predisposition associated with one of these genes.  Most cancers happen by chance and this negative test suggests that his cancer falls into this category.  We, therefore, recommended he continue to follow the cancer management and screening guidelines provided by his oncology and primary healthcare provider.   RECOMMENDATIONS FOR FAMILY MEMBERS: Women in this family might be at some increased risk of developing cancer, over the general population risk, simply due to the family history of cancer. We recommended women in this family have a yearly mammogram beginning at age 52,  or 10 years younger than the earliest onset of cancer, an annual clinical breast exam, and perform monthly breast self-exams. Women in this family should also have a gynecological exam as recommended by their primary provider. All family members should have a colonoscopy by age 91.  FOLLOW-UP: Lastly, we discussed with Daniel Burnett that cancer genetics is a rapidly advancing field and it is possible that new genetic tests will be appropriate for him and/or his family members in the future. We encouraged him to remain in contact with cancer genetics on an annual basis so we can update his personal and family histories and let him know of advances in cancer genetics that may benefit this family.   Our contact number was provided. Daniel Burnett questions were answered to his satisfaction, and he knows he is welcome to call us at anytime with additional questions or concerns.   Roma Kayser, MS, [facility] Certified Genetic Counselor [email or web address]

## 2017-12-23 ENCOUNTER — Other Ambulatory Visit: Payer: Self-pay | Admitting: Internal Medicine

## 2017-12-23 ENCOUNTER — Telehealth: Payer: Self-pay

## 2017-12-23 NOTE — Telephone Encounter (Signed)
Called and spoke with Mrs. Ratz. Hospice has spoken with them and completed initial assessment. I encouraged Mrs. Nissan to reach out with questions or concerns.

## 2017-12-28 ENCOUNTER — Other Ambulatory Visit: Payer: Self-pay | Admitting: Internal Medicine

## 2017-12-28 DIAGNOSIS — R12 Heartburn: Secondary | ICD-10-CM

## 2017-12-28 MED ORDER — ESOMEPRAZOLE MAGNESIUM 40 MG PO CPDR: 40.0000 mg | DELAYED_RELEASE_CAPSULE | Freq: Every day | ORAL | 3 refills | Status: AC

## 2017-12-31 ENCOUNTER — Telehealth: Payer: Self-pay | Admitting: *Deleted

## 2017-12-31 NOTE — Telephone Encounter (Signed)
OK to stop mirtazapine if it's not helping. I suggest him to start zoloft 50mg  daily, and OK to increase if needed in the future.   Truitt Merle MD

## 2017-12-31 NOTE — Telephone Encounter (Signed)
Received call earlier from [facility] RN/Hospice stating pt is requesting med for depression.  Talked with Cira Rue NP & she wanted to know if pt was taking Remeron.  Called & spoke to Sherry/Triage RN/Hospice & she states it is on pt's med list but deferred to his RN.  Called Sherando & she states pt is taking but not working.  He says that it just makes him sleepy.  Informed that message would be sent to Dr Burr Medico but may be tomorrow before we get back with her. She is OK with this.  Message routed to Dr Burr Medico.

## 2018-01-03 ENCOUNTER — Other Ambulatory Visit: Payer: Self-pay | Admitting: *Deleted

## 2018-01-03 DIAGNOSIS — C250 Malignant neoplasm of head of pancreas: Secondary | ICD-10-CM

## 2018-01-03 MED ORDER — MIRTAZAPINE 7.5 MG PO TABS: 7.5000 mg | ORAL_TABLET | Freq: Every day | ORAL | 1 refills | Status: AC

## 2018-01-08 NOTE — Telephone Encounter (Signed)
Called Daniel Burnett/Hospice & informed of Dr Ernestina Penna message.  She states she will call send in script for zoloft per this note.

## 2018-01-23 ENCOUNTER — Other Ambulatory Visit: Payer: Self-pay | Admitting: Internal Medicine

## 2018-01-31 ENCOUNTER — Telehealth: Payer: Self-pay

## 2018-01-31 NOTE — Telephone Encounter (Signed)
Keisha with Hospice called stating that his current pain medication regimen which is Norco 5/325 taking 1 q6h and Tramadol 50 mg taking 1 q4h is not helping with his current pain.  She is suggesting Morphine.  Hospice RN will contact their MD to manage his pain in the interim.

## 2018-02-04 ENCOUNTER — Telehealth: Payer: Self-pay | Admitting: *Deleted

## 2018-02-04 ENCOUNTER — Telehealth: Payer: Self-pay

## 2018-02-04 NOTE — Telephone Encounter (Signed)
Received a call from [facility] that patient passed away this morning.  Received call from [facility] stating they were told Dr. Burr Medico will sign death certificate.  [phone]

## 2018-02-04 NOTE — Telephone Encounter (Signed)
Voicemail received reporting "patient passed away this morning at 9:50 am at [facility] with [place] .  If you have any questions, call [phone].  Message forwarded to [facility] nurse to share with povider or any further communication with [facility].

## 2018-02-04 NOTE — Telephone Encounter (Signed)
"  Juliann Pulse with [facility] calling for provider in reference to this patient's Death certificate."

## 2018-02-05 DEATH — deceased

## 2018-02-11 ENCOUNTER — Encounter: Payer: Self-pay | Admitting: Internal Medicine

## 2018-05-05 IMAGING — CT CT ABD-PELV W/ CM
3 of 5 series · 11 of 36 positions shown, 17 images · IV contrast ([ID] [id number])
Comparison: Chest x-ray 09/27/2015

CLINICAL DATA: Abdominal pain after eating. Weight loss.
Constipation, nausea for 3 months.

EXAM:
CT ABDOMEN AND PELVIS WITH CONTRAST
TECHNIQUE: Multidetector CT imaging of the abdomen and pelvis was performed
using the standard protocol following bolus administration of
intravenous contrast.
CONTRAST:  100mL [id number] IOPAMIDOL ([id number]) INJECTION 61%

[Series 3: abd/pelvis with · axial · 0.77mm/px · z∈[-369,+31]mm · 8 of 104 slices shown, 13 images]
[im 12/104  soft-tissue]
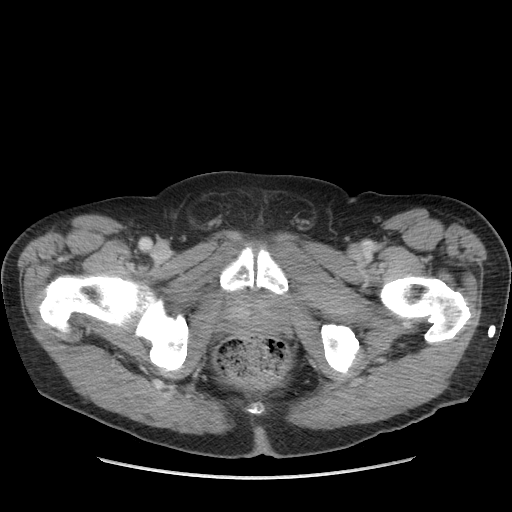
[im 12/104  bone]
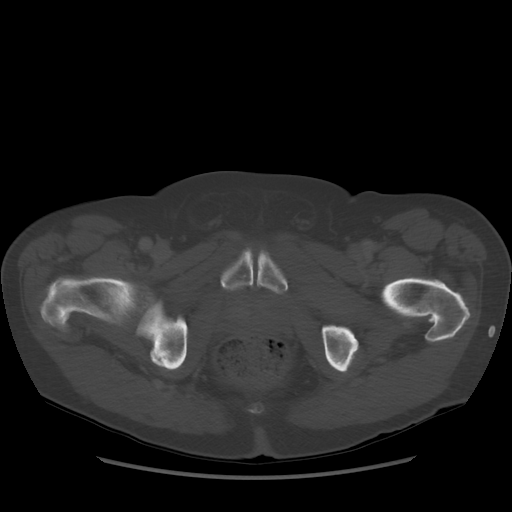
[im 23/104  soft-tissue]
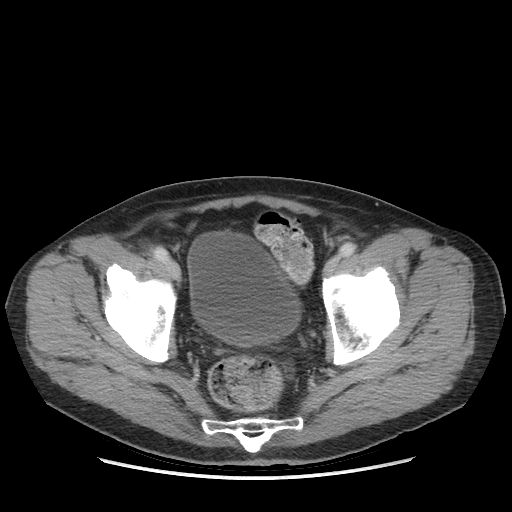
[im 35/104  soft-tissue]
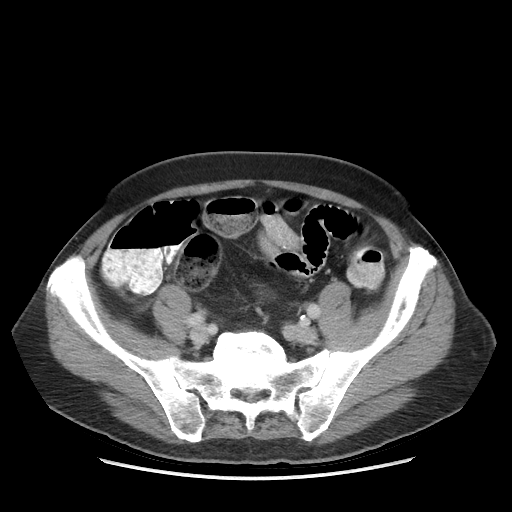
[im 46/104  soft-tissue]
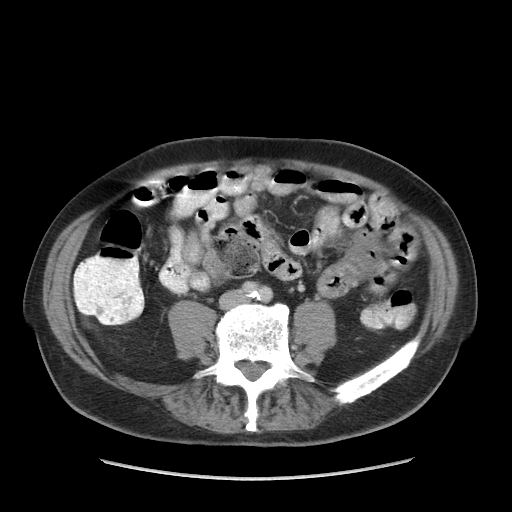
[im 58/104  soft-tissue]
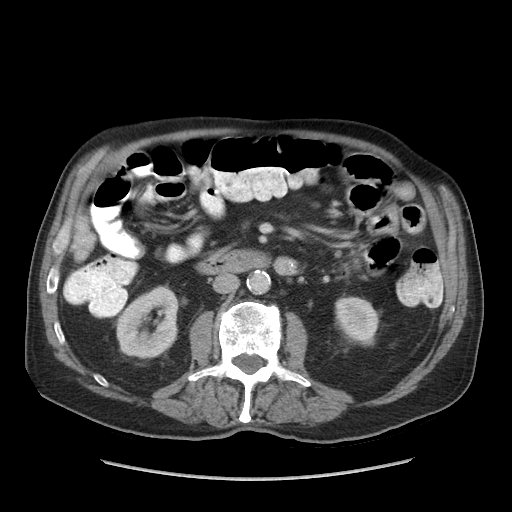
[im 58/104  lung]
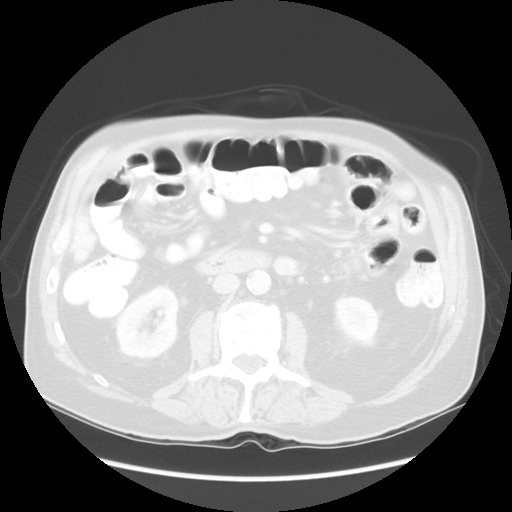
[im 69/104  soft-tissue]
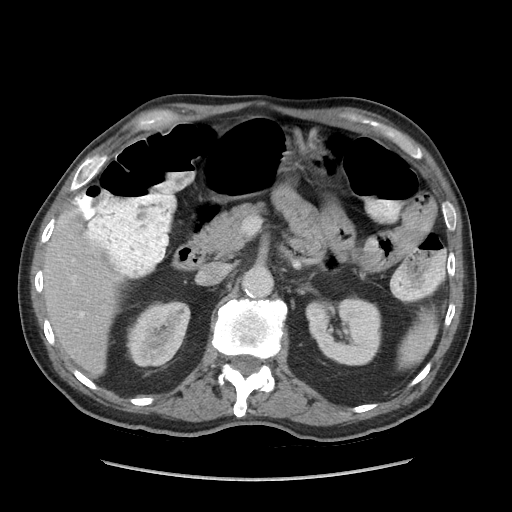
[im 69/104  lung]
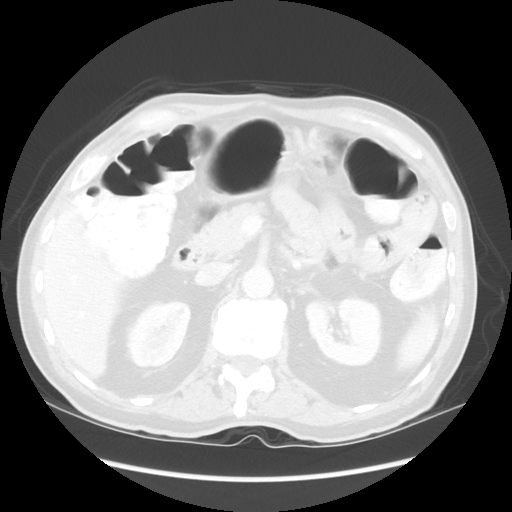
[im 81/104  soft-tissue]
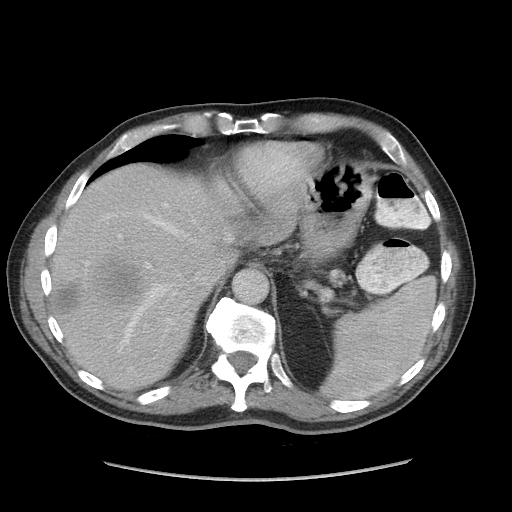
[im 81/104  lung]
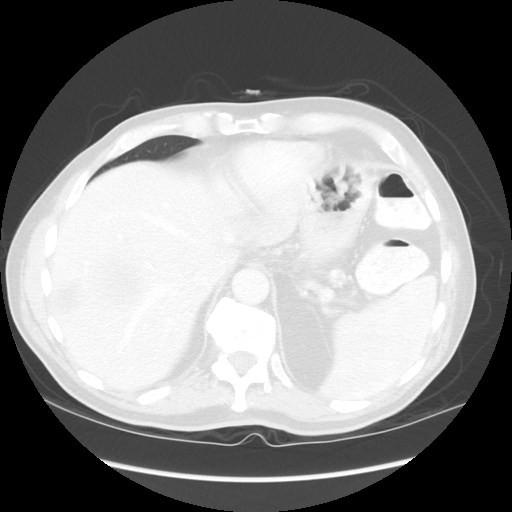
[im 92/104  soft-tissue]
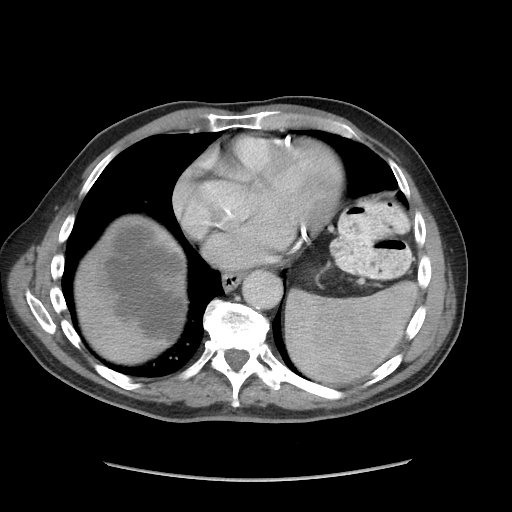
[im 92/104  lung]
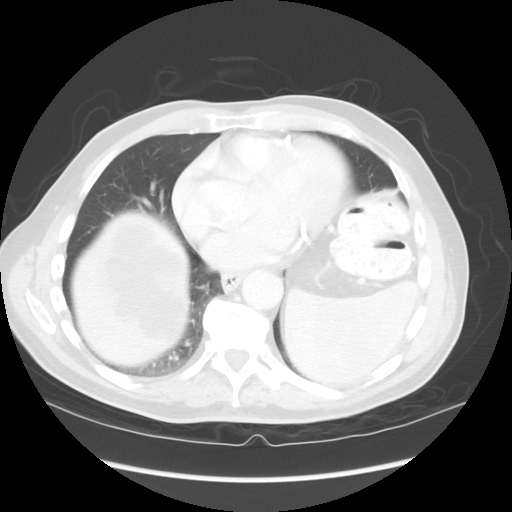

[Series 601: coronal body · coronal · 1.03mm/px · 1 of 118 slices shown, 2 images]
[im 40/118  soft-tissue]
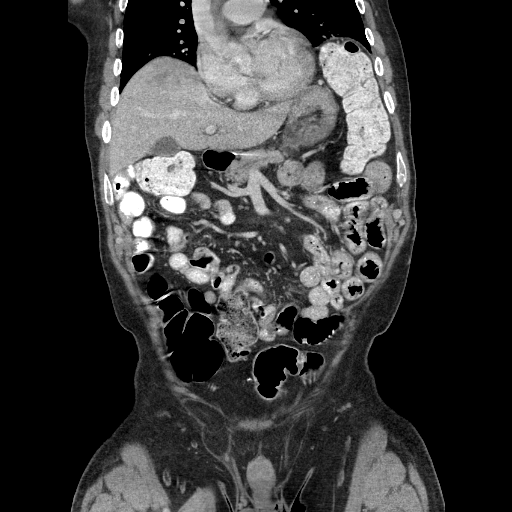
[im 40/118  bone]
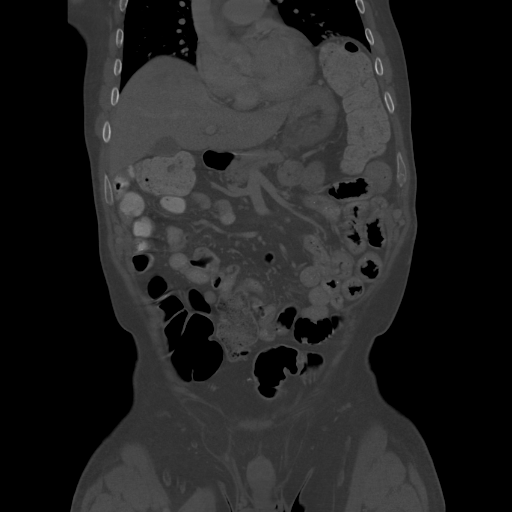

[Series 602: sagittal body · sagittal · 1.03mm/px · 2 of 158 slices shown]
[im 12/158  soft-tissue]
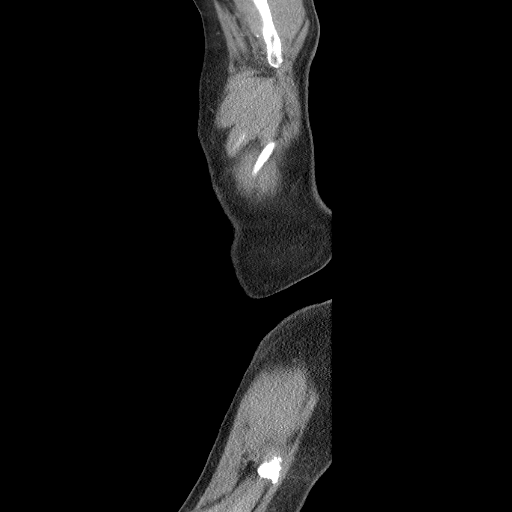
[im 34/158  soft-tissue]
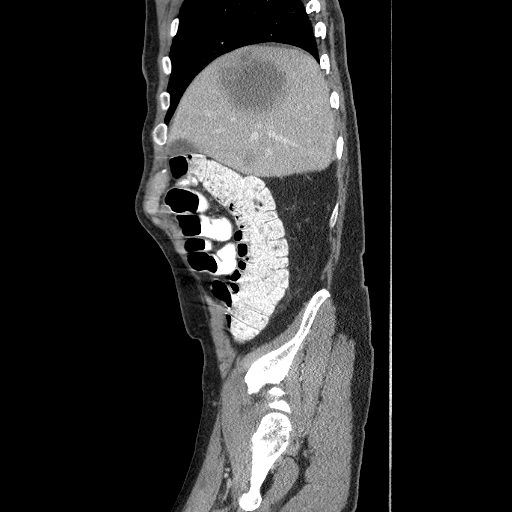

[11 of 36 positions shown; findings below may reference images not displayed]

FINDINGS: Lower chest: There is minimal right lower lobe atelectasis. No
suspicious pulmonary nodules are identified at the bases. There is
significant coronary atherosclerotic calcification and/or stents.

Hepatobiliary: Numerous liver lesions are present. Multiple
confluent lesions at the dome of the right hepatic lobe together
measures 7.5 x 10.2 centimeters. A small lesion in the right hepatic
lobe is 2.7 centimeters. Left hepatic lobe lesion is
centimeters. The gallbladder is present. Lesions show indistinct
peripheral margins and lack characteristics of benign liver lesions,
suspicious for metastatic disease.

Pancreas: There is a low-attenuation lesion involving the pancreatic
body measuring 3.3 x 6.4 x 3.5 centimeters. No peripancreatic
stranding or other evidence for acute pancreatitis. No pancreatic
calcifications indicate chronic pancreatitis.

Spleen: The spleen is enlarged, without focal lesion.

Adrenals/Urinary Tract: Adrenal glands are normal in appearance.
Within the midpole region of the left kidney, there is a cystic mass
containing at least a single septation measuring 1.8 x
centimeters an indeterminate by CT. Suspect nonobstructing
intrarenal calculus in the lower pole the right kidney, 3
millimeters in diameter. Suspect nonobstructing calculus in the
lower pole the left kidney, measuring 1.4 centimeters.
Alternatively, this could represent early excretion of contrast on
this contrast-enhanced exam. No evidence for urinary tract
obstruction. The urinary bladder is unremarkable in appearance.

Stomach/Bowel: The stomach and small bowel loops are normal in
appearance. There are numerous sigmoid diverticula. No acute
diverticulitis.

Vascular/Lymphatic: There is dense atherosclerotic calcification of
the abdominal aorta not associated with aneurysm. Small portacaval
lymph nodes are identified, measuring less than 1 centimeter in
short axis.

Reproductive: The prostate is enlarged. Fat containing bilateral
inguinal hernias. No free pelvic fluid.

Other: None

Musculoskeletal: No suspicious lytic or blastic lesions are
identified. There are moderate degenerative changes throughout the
lower thoracic and lumbar spine.
IMPRESSION: 1. 6.4 centimeter low-attenuation pancreatic mass suspicious for
neoplasm. Although focal pancreatitis/pseudocysts are
considerations, given the lack of secondary signs of pancreatitis,
malignancy is favored. Recommend further evaluation with MRI.
2. Numerous liver lesions suspicious for metastatic disease.
3. Splenomegaly.
4. Indeterminate left renal mass measuring 1.8 centimeters,
warranting further characterization. Recommend further evaluation
with renal protocol MRI.
5.  Aortic atherosclerosis.  ([id number])
6. Prostatic enlargement.
7. Bilateral fat containing inguinal hernias.
8. Coronary artery atherosclerosis.

## 2018-05-07 IMAGING — CR DG ORBITS FOR FOREIGN BODY
2 series · 2 of 2 positions shown · non-contrast
Comparison: None.

CLINICAL DATA: Metal working/exposure; clearance prior to MRI

EXAM:
ORBITS FOR FOREIGN BODY - 2 VIEW

[w orbit pa (1 of 2)]
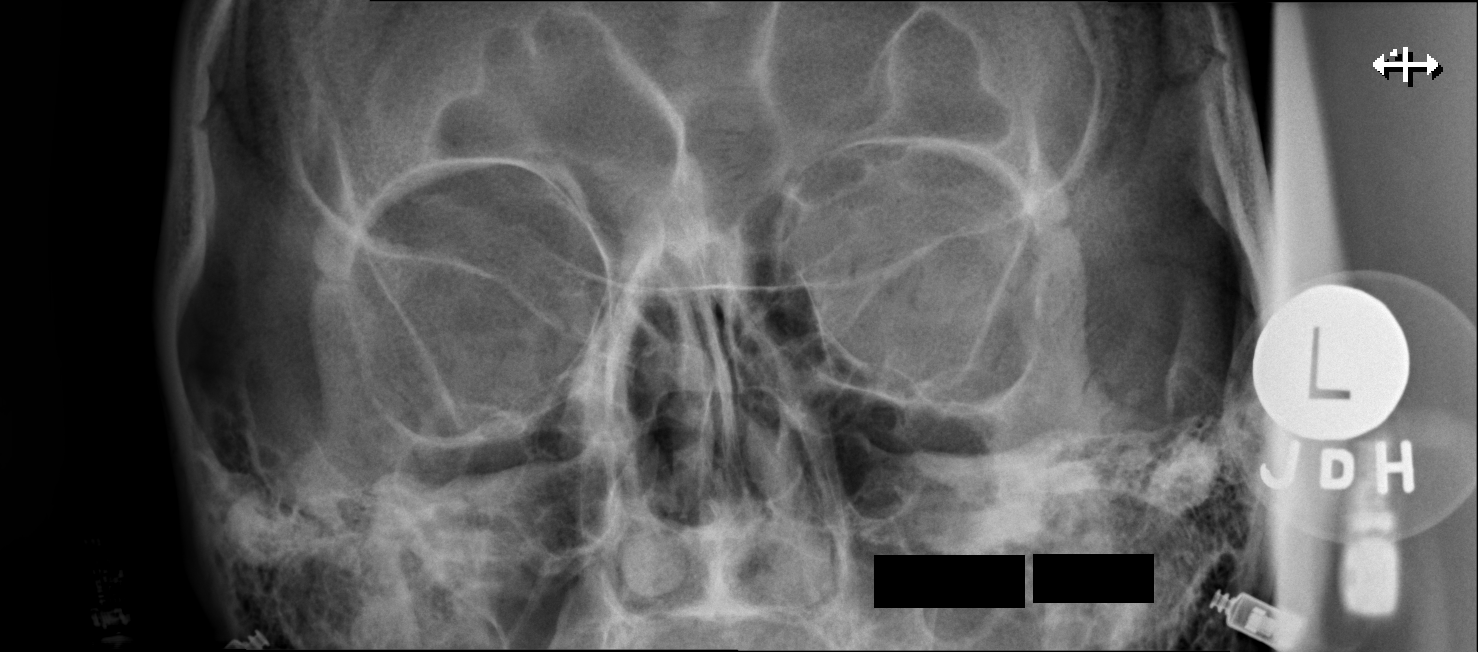

[w orbit pa (2 of 2)]
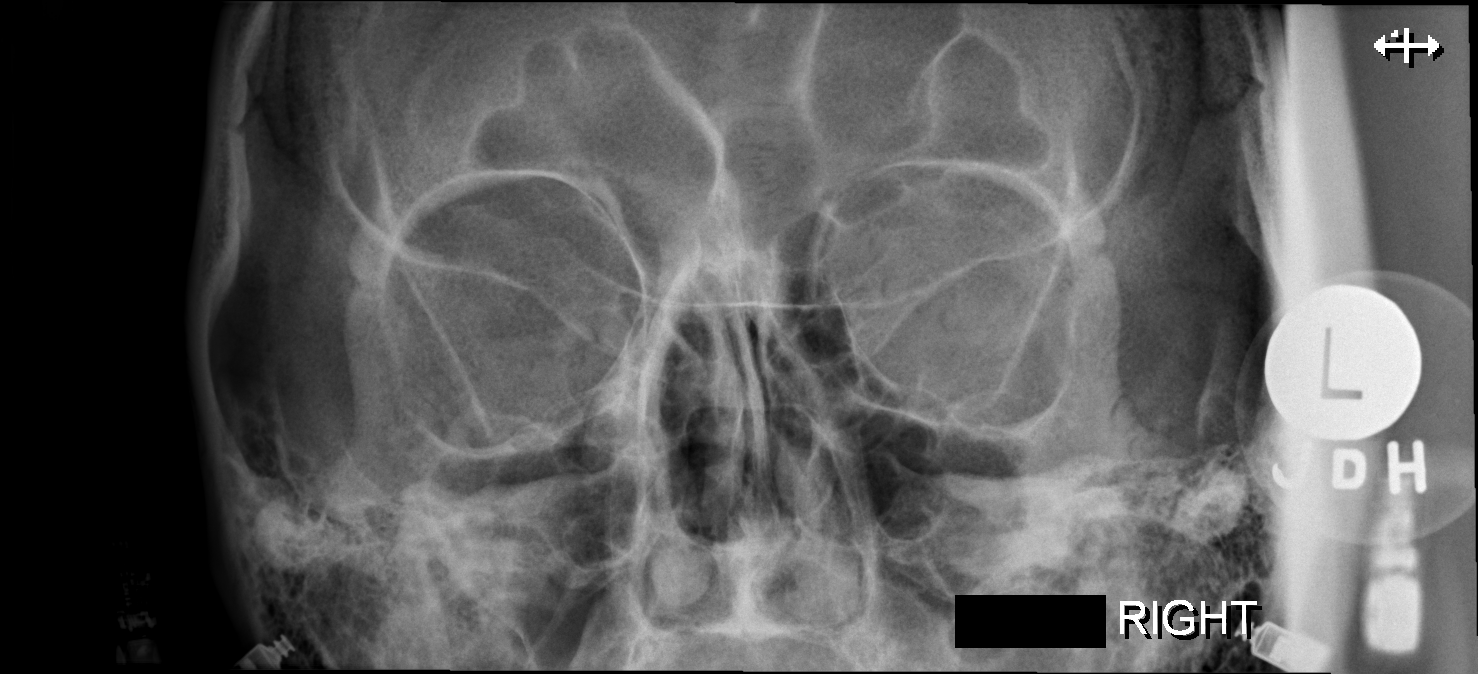

[2 of 2 positions shown; findings below may reference images not displayed]

FINDINGS: There is no evidence of metallic foreign body within the orbits. No
significant bone abnormality identified.
IMPRESSION: No evidence of metallic foreign body within the orbits.

By: Daryl Salami M.D.

## 2018-09-05 ENCOUNTER — Telehealth: Payer: Self-pay | Admitting: Hematology

## 2018-09-05 NOTE — Telephone Encounter (Signed)
Printed records for patients wife, release [id number]

## 2018-09-08 ENCOUNTER — Telehealth: Payer: Self-pay

## 2018-09-08 NOTE — Telephone Encounter (Signed)
Send form to [facility].

## 2018-09-08 NOTE — Telephone Encounter (Signed)
Dropped off attending physician's form brought in by family to Ivin Poot - in [place] office.

## 2018-09-16 NOTE — Progress Notes (Signed)
Attending Physician Statement completed and mailed to spouse, Rickie Chason.
